# Patient Record
Sex: Male | Born: 1960 | Race: White | Hispanic: No | Marital: Single | Smoking: Never smoker
Health system: Southern US, Community
[De-identification: ages and names within clinical notes are randomized; demographics above are authoritative.]

## PROBLEM LIST (undated history)

## (undated) DIAGNOSIS — I10 Essential (primary) hypertension: Secondary | ICD-10-CM

## (undated) DIAGNOSIS — F1010 Alcohol abuse, uncomplicated: Secondary | ICD-10-CM

## (undated) DIAGNOSIS — F419 Anxiety disorder, unspecified: Secondary | ICD-10-CM

## (undated) DIAGNOSIS — J45909 Unspecified asthma, uncomplicated: Secondary | ICD-10-CM

## (undated) DIAGNOSIS — F319 Bipolar disorder, unspecified: Secondary | ICD-10-CM

---

## 2004-02-07 ENCOUNTER — Inpatient Hospital Stay (HOSPITAL_COMMUNITY): Admission: EM | Admit: 2004-02-07 | Discharge: 2004-02-09 | Payer: Self-pay | Admitting: Emergency Medicine

## 2006-07-14 ENCOUNTER — Emergency Department (HOSPITAL_COMMUNITY): Admission: EM | Admit: 2006-07-14 | Discharge: 2006-07-14 | Payer: Self-pay | Admitting: Emergency Medicine

## 2006-08-25 ENCOUNTER — Emergency Department (HOSPITAL_COMMUNITY): Admission: EM | Admit: 2006-08-25 | Discharge: 2006-08-26 | Payer: Self-pay | Admitting: Emergency Medicine

## 2007-03-30 ENCOUNTER — Emergency Department (HOSPITAL_COMMUNITY): Admission: EM | Admit: 2007-03-30 | Discharge: 2007-03-30 | Payer: Self-pay | Admitting: Emergency Medicine

## 2008-06-23 ENCOUNTER — Inpatient Hospital Stay (HOSPITAL_COMMUNITY): Admission: EM | Admit: 2008-06-23 | Discharge: 2008-06-27 | Payer: Self-pay | Admitting: Emergency Medicine

## 2008-06-23 ENCOUNTER — Ambulatory Visit: Payer: Self-pay | Admitting: Internal Medicine

## 2008-06-24 ENCOUNTER — Encounter (INDEPENDENT_AMBULATORY_CARE_PROVIDER_SITE_OTHER): Payer: Self-pay | Admitting: General Surgery

## 2008-06-25 ENCOUNTER — Encounter: Payer: Self-pay | Admitting: Cardiology

## 2008-07-20 ENCOUNTER — Emergency Department (HOSPITAL_COMMUNITY): Admission: EM | Admit: 2008-07-20 | Discharge: 2008-07-21 | Payer: Self-pay | Admitting: Emergency Medicine

## 2008-09-01 ENCOUNTER — Emergency Department (HOSPITAL_COMMUNITY): Admission: EM | Admit: 2008-09-01 | Discharge: 2008-09-01 | Payer: Self-pay | Admitting: Emergency Medicine

## 2010-10-02 LAB — DIFFERENTIAL
Basophils Absolute: 0 10*3/uL (ref 0.0–0.1)
Eosinophils Absolute: 0.1 10*3/uL (ref 0.0–0.7)
Neutro Abs: 5.7 10*3/uL (ref 1.7–7.7)
Neutrophils Relative %: 75 % (ref 43–77)

## 2010-10-02 LAB — POCT I-STAT, CHEM 8
BUN: 14 mg/dL (ref 6–23)
Calcium, Ion: 1.17 mmol/L (ref 1.12–1.32)
Chloride: 109 mEq/L (ref 96–112)
Creatinine, Ser: 0.9 mg/dL (ref 0.4–1.5)
Glucose, Bld: 101 mg/dL — ABNORMAL HIGH (ref 70–99)
Sodium: 141 mEq/L (ref 135–145)

## 2010-10-02 LAB — PROTIME-INR: Prothrombin Time: 12.5 seconds (ref 11.6–15.2)

## 2010-10-02 LAB — POCT CARDIAC MARKERS
CKMB, poc: <1 ng/mL — ABNORMAL LOW (ref 1.0–8.0)
Troponin i, poc: <0.05 ng/mL (ref 0.00–0.09)
Troponin i, poc: <0.05 ng/mL (ref 0.00–0.09)

## 2010-10-02 LAB — CBC
MCV: 97.8 fL (ref 78.0–100.0)
Platelets: 232 10*3/uL (ref 150–400)

## 2010-10-06 LAB — POCT I-STAT, CHEM 8
BUN: 17 mg/dL (ref 6–23)
Calcium, Ion: 1.08 mmol/L — ABNORMAL LOW (ref 1.12–1.32)
Creatinine, Ser: 1 mg/dL (ref 0.4–1.5)
Glucose, Bld: 110 mg/dL — ABNORMAL HIGH (ref 70–99)
TCO2: 22 mmol/L (ref 0–100)

## 2010-10-06 LAB — CBC
HCT: 44.7 % (ref 39.0–52.0)
HCT: 45.9 % (ref 39.0–52.0)
Hemoglobin: 15.1 g/dL (ref 13.0–17.0)
Hemoglobin: 15.7 g/dL (ref 13.0–17.0)
MCHC: 33.9 g/dL (ref 30.0–36.0)
MCHC: 34.3 g/dL (ref 30.0–36.0)
Platelets: 238 10*3/uL (ref 150–400)
RBC: 4.57 MIL/uL (ref 4.22–5.81)
RDW: 13 % (ref 11.5–15.5)
RDW: 12.9 % (ref 11.5–15.5)

## 2010-10-06 LAB — BASIC METABOLIC PANEL
BUN: 10 mg/dL (ref 6–23)
BUN: 11 mg/dL (ref 6–23)
Calcium: 8.2 mg/dL — ABNORMAL LOW (ref 8.4–10.5)
Calcium: 8.4 mg/dL (ref 8.4–10.5)
Calcium: 8.6 mg/dL (ref 8.4–10.5)
Chloride: 102 mEq/L (ref 96–112)
Chloride: 103 mEq/L (ref 96–112)
Creatinine, Ser: 0.79 mg/dL (ref 0.4–1.5)
Creatinine, Ser: 1.01 mg/dL (ref 0.4–1.5)
Creatinine, Ser: 1.02 mg/dL (ref 0.4–1.5)
GFR calc Af Amer: >60 mL/min (ref >60)
GFR calc non Af Amer: >60 mL/min (ref >60)
GFR calc non Af Amer: >60 mL/min (ref >60)
GFR calc non Af Amer: >60 mL/min (ref >60)
Glucose, Bld: 116 mg/dL — ABNORMAL HIGH (ref 70–99)
Potassium: 3.4 mEq/L — ABNORMAL LOW (ref 3.5–5.1)

## 2010-10-06 LAB — APTT: aPTT: 26 seconds (ref 24–37)

## 2010-10-06 LAB — URINALYSIS, ROUTINE W REFLEX MICROSCOPIC
Bilirubin Urine: NEGATIVE
Glucose, UA: NEGATIVE mg/dL
Nitrite: NEGATIVE
Specific Gravity, Urine: 1.002 — ABNORMAL LOW (ref 1.005–1.030)
pH: 6.5 (ref 5.0–8.0)

## 2010-10-06 LAB — HEPATIC FUNCTION PANEL
Albumin: 3.4 g/dL — ABNORMAL LOW (ref 3.5–5.2)
Alkaline Phosphatase: 78 U/L (ref 39–117)
Indirect Bilirubin: 0.8 mg/dL (ref 0.3–0.9)
Total Protein: 6.4 g/dL (ref 6.0–8.3)

## 2010-10-06 LAB — DIFFERENTIAL
Basophils Absolute: 0 10*3/uL (ref 0.0–0.1)
Basophils Absolute: 0 10*3/uL (ref 0.0–0.1)
Basophils Relative: 0 % (ref 0–1)
Eosinophils Relative: 0 % (ref 0–5)
Eosinophils Relative: 1 % (ref 0–5)
Lymphocytes Relative: 20 % (ref 12–46)
Monocytes Absolute: 0.3 10*3/uL (ref 0.1–1.0)
Monocytes Absolute: 0.3 10*3/uL (ref 0.1–1.0)
Monocytes Relative: 4 % (ref 3–12)
Neutro Abs: 5 10*3/uL (ref 1.7–7.7)

## 2010-10-06 LAB — CARDIAC PANEL(CRET KIN+CKTOT+MB+TROPI)
CK, MB: 1.1 ng/mL (ref 0.3–4.0)
Relative Index: 0.7 (ref 0.0–2.5)
Troponin I: <0.01 ng/mL (ref 0.00–0.06)

## 2010-10-06 LAB — TSH: TSH: 1.967 u[IU]/mL (ref 0.350–4.500)

## 2010-10-06 LAB — LIPID PANEL
Cholesterol: 210 mg/dL — ABNORMAL HIGH (ref 0–200)
HDL: 55 mg/dL (ref >39)
LDL Cholesterol: 119 mg/dL — ABNORMAL HIGH (ref 0–99)

## 2010-10-06 LAB — RAPID URINE DRUG SCREEN, HOSP PERFORMED
Benzodiazepines: POSITIVE — AB
Cocaine: NOT DETECTED
Opiates: NOT DETECTED
Tetrahydrocannabinol: NOT DETECTED

## 2010-10-06 LAB — MAGNESIUM: Magnesium: 2.1 mg/dL (ref 1.5–2.5)

## 2010-11-04 NOTE — H&P (Signed)
NAME:  Eddie Torres, Eddie Torres NO.:  [id number]   MEDICAL RECORD NO.:  [id number]          PATIENT TYPE:  EMS   LOCATION:  MAJO                         FACILITY:  [facility]   PHYSICIAN:  Adolph Pollack, M.D.DATE OF BIRTH:  Sep 04, 1960   DATE OF ADMISSION:  06/23/2008  DATE OF DISCHARGE:                              HISTORY & PHYSICAL   This is a trauma admission.   HISTORY:  This is a 50 year old male who came to the emergency  department after a witnessed fall from level ground.  Apparently, he was  heavily intoxicated.  Witnesses state that they thought the patient may  have had a loss of consciousness, but that is unclear.  He is  transferred to [facility] Emergency Department where he was evaluated.  Part of his evaluation include head CT, which demonstrated a  subarachnoid hemorrhage in the frontal region.  No mass effect or  midline shift.  Cervical spine x-ray was performed.  No C-spine fracture  was noted.  He initially was combative, but has calmed down.  He is  quite intoxicated, but denies any pain or trauma to any other part of  his body.   PAST MEDICAL HISTORY:  1. History of syncope.  2. Alcohol abuse.  3. L3-L4 herniation.  4. Depression and anxiety.  5. Gastroesophageal reflux disease.  6. Asthma.   PREVIOUS OPERATIONS:  Denies.   MEDICATIONS:  1. Goody Powders p.r.n.  2. Albuterol inhaler as needed.   SOCIAL HISTORY:  He is a former smoker.  Drinks alcohol daily and he was  drinking heavily today.  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   FAMILY HISTORY:  Coronary artery disease.   REVIEW OF SYSTEMS:  Cannot get a reliable review of systems in a current  intoxicated state.   PHYSICAL EXAMINATION:  (Made difficult by mental status and his  intoxicated state).  GENERAL:  A well-developed and well-nourished male sitting upright in  the stretcher, C-collar on, noncombative.  HEENT:  Normocephalic and atraumatic.  PERRLA.  EOMI.  Ears normal.  NECK:  C-collar on.  Denies C-spine tenderness.  Trachea midline.  CHEST:  No contusions or abrasions.  Breath sounds equal and clear.  CARDIOVASCULAR:  Regular rate regular.  No murmur.  No JVD.  ABDOMEN:  Soft and nontender.  No abrasions or contusions.  PELVIS:  No tenderness or deformity.  MUSCULOSKELETAL:  No palpable bony deformities or tenderness.  BACK:  No spinal tenderness.  NEUROLOGIC:  He has slurred speech, unintelligible at times, but he does  follow commands.  His Glasgow coma scale is 14-15.   LABORATORY DATA:  Blood alcohol 376.  INR 1.0.  Hemoglobin 15.1,  platelet count 232,000.  White count normal.  Electrolytes demonstrate  potassium 3.4, glucose of 107, otherwise within normal limits.   X-Rays:  His CT of the head demonstrates subarachnoid hemorrhage as per  HPI and C-spine demonstrates no fracture or dislocation present.  A  portal chest x-ray was ordered.  Final report is pending at this time,  but my preliminary review shows no evidence of pneumothorax.   IMPRESSION:  Fall from level ground in acute intoxication with  subarachnoid hemorrhage and no significant neurologic deficits at this  time.  He has been seen already by Dr. Lovell Sheehan in Neurosurgery.   PLAN:  We will admit to the ICU for close observation.  Keep him n.p.o.  Give him supplemental folate, thiamine, and magnesium.  We will repeat  CT scan early tomorrow morning.      Adolph Pollack, M.D.  Electronically Signed     TJR/MEDQ  D:  06/23/2008  T:  06/24/2008  Job:  [id number]

## 2010-11-04 NOTE — Discharge Summary (Signed)
NAMEJARMON, Eddie NO.:  [id number]   MEDICAL RECORD NO.:  [id number]          PATIENT TYPE:  INP   LOCATION:  3110                         FACILITY:  [facility]   PHYSICIAN:  Cherylynn Ridges, M.D.    DATE OF BIRTH:  1961/06/02   DATE OF ADMISSION:  06/23/2008  DATE OF DISCHARGE:  06/27/2008                               DISCHARGE SUMMARY   DISCHARGE DIAGNOSES:  1. Fall.  2. Traumatic brain injury with subarachnoid hemorrhage.  3. Ventricular tachycardia.  4. Alcohol abuse.  5. Asthma.  6. Gastroesophageal reflux disease.  7. Depression and anxiety.   CONSULTANTS:  Dr. Lovell Sheehan for Neurosurgery and Dr. Graciela Husbands for  Interventional Cardiology.   PROCEDURES:  None.   HISTORY OF PRESENT ILLNESS:  This is a 50 year old white male who came  to the emergency department after a witnessed fall from level ground.  He came in as a non-trauma code.  He was significantly intoxicated and  initially combative.  Workup demonstrated subarachnoid hemorrhage and he  was admitted, transferred to the Intensive Care Unit for further  observation, and Neurosurgery was consulted.   HOSPITAL COURSE:  On hospital day #2, the patient developed a  significant run of what appeared to be V tach and he spontaneously  converted.  Cardiology was called and initially put him on beta-blocker  and Coreg.  He was unable to tolerate the beta-blocker because of  hypotension and so was just kept on Coreg.  He had no further events  passed that first day while here in the hospital.  His brain injury did  not progress radiologically and the patient continued to improve  clinically until physical therapy thought he was safe to go home.  He  was discharged today in good condition.   DISCHARGE MEDICATIONS:  1. Coreg 3.125 mg, take 1 p.o. b.i.d. #60 with no refill.  2. Percocet 5/325, take 1-2 p.o. q.4 h p.r.n. pain, #60 with no      refill.  3. He may resume taking his Xanax 0.5 mg at bedtime p.r.n.  insomnia.   FOLLOWUP:  The patient will need to follow up with Dr. Lovell Sheehan and Dr.  Graciela Husbands in their offices.  Follow up with the Trauma Service will be on an  as-needed basis.      Earney Hamburg, P.A.      Cherylynn Ridges, M.D.  Electronically Signed    MJ/MEDQ  D:  06/27/2008  T:  06/27/2008  Job:  [id number]   cc:   Duke Salvia, MD, [facility]  Cristi Loron, M.D.

## 2010-11-04 NOTE — Consult Note (Signed)
NAME:  Eddie Torres, Eddie Torres NO.:  [id number]   MEDICAL RECORD NO.:  [id number]          PATIENT TYPE:  INP   LOCATION:  3110                         FACILITY:  [facility]   PHYSICIAN:  Cristi Loron, M.D.DATE OF BIRTH:  [birth year]   DATE OF CONSULTATION:  DATE OF DISCHARGE:                                 CONSULTATION   CHIEF COMPLAINT:  Fall.   HISTORY OF PRESENT ILLNESS:  The patient is a 50 year old intoxicated  white male by report was that his brother's house, intoxicated, he fell  ending his head on some concrete.  EMS was called.  He was brought to  the [facility] Emergency Department.  Prior to my arrival, apparently  the patient was quite combative and has been sedated.  The patient  denies neck pain and back pain.   Presently, the patient is arousable and somewhat somnolent.  He is  unable to provide a medical history.   The patient's past medical history, past surgical history, family  history, and social history are all obtained via medical records and by  report he has history of asthma.  He is a heavy drinker and he denies  tobacco use.   REVIEW OF SYSTEMS:  Unobtainable.   PHYSICAL EXAMINATION:  GENERAL:  An obviously intoxicated, 50 year old  white male who smells of alcohol, who is restrained and wearing a  cervical collar.  HEENT:  Normocephalic.  The patient's pupils equal, round, and react to  light.  Extraocular muscles are intact.  No obvious cranial nerve  defects.  NECK:  Supple without masses, deformities, or tracheal deviation.  He is  wearing a cervical collar.  THORAX:  Symmetric.  ABDOMEN:  Obese, soft.  EXTREMITIES:  No obvious deformities.  BACK:  No point tenderness or deformities.  NEUROLOGIC:  The patient's Glasgow coma scale is 13 (E4, M6, V3).  He is  alert, but not oriented.  The patient's level of cooperation is limited.  He appears to have grossly normal motor strength in all 4 extremities.  Cranial nerve exam is  limited, but appears grossly normal as above.  His  pupils were equal and reactive.  Deep tendon reflexes are 1/4 in  bilateral biceps, triceps, quadriceps, and gastrocnemius.  There is no  ankle clonus.   IMAGING STUDIES:  I have reviewed the patient's cranial CT scan  performed without contrast at [facility] on June 23, 2008.  It demonstrates the patient has small interhemispheric subdural  hematoma/subarachnoid hemorrhage.  There is no blood in the ambient  cistern.  Also, we reviewed the patient's cervical CT scan.  It  demonstrates he had some disk degeneration and spondylosis C6-7, but no  fracture subluxation, abnormal soft tissue swelling.   ASSESSMENT AND PLAN:  1. Intoxication.  The patient is going to be admitted by one of the      medical teams who likely need delirium tremens prophylaxis, given      his history of heavy alcohol abuse.  The medical team is going to      handle this.  2. Closed head injury.  Subdural hematoma/subarachnoid hemorrhage.  This is a small bleed.  The patient needs to be observed and have      his CAT scan repeated in approximately 18-24 hours.  He is very      unlikely to need any intervention unless there was significantly      more bleeding.      Cristi Loron, M.D.  Electronically Signed     JDJ/MEDQ  D:  06/23/2008  T:  06/24/2008  Job:  [id number]

## 2010-11-07 NOTE — Discharge Summary (Signed)
NAME:  Eddie Torres, Eddie Torres                       ACCOUNT NO.:  [id number]   MEDICAL RECORD NO.:  [id number]                   PATIENT TYPE:  INP   LOCATION:  3743                                 FACILITY:  [facility]   PHYSICIAN:  C. Ulyess Mort, M.D.             DATE OF BIRTH:  [birth year]   DATE OF ADMISSION:  02/07/2004  DATE OF DISCHARGE:  02/09/2004                                 DISCHARGE SUMMARY   DISCHARGE DIAGNOSES:  1. Syncope.  2. Asthma.  3. Gastroesophageal reflux disease.  4. Panic attacks and depression with generalized anxiety.  5. Isolated syncope in 1993, following overnight party.  6. Alcohol use/abuse.  7. L3-L4 herniation.  8. Right knuckle displacement in 2005.   DISCHARGE MEDICATIONS:  1. Zoloft 25 mg p.o. q.d. for depression and anxiety (starting dose).  2. Albuterol MDI one to two puffs as needed for wheezing.  3. Prilosec OTC as directed for GERD.  4. Percocet 5/325 mg one to two tablets p.o. every 4-6 hours as needed for     pain.  5. Ambien 10 mg p.o. q.h.s. for sleep, 15 tablets only.  6. Tums one tablet p.o. two to three times daily.  7. Multivitamin OTC p.o. q.d.   FOLLOW UP:  Eddie Torres will follow up here at the clinic at [facility].  It  will be arranged for him at that time to have a primary care Davian Hanshaw  provided by [facility].  At this time, it would be good to check his blood  pressure.  It was in the 150s/90s in the hospital.  On three separate  occasions, he had hypertension.  It will also be necessary to check his  potassium this visit.  He will need to be evaluated for an increase in his  Zoloft from 25-50 mg.  This is the starting dose for his anxiety/depression.  It is known to be necessary to increase the full limits of the SSRI in order  to treat the anxiety.  He is aware of this transition and the possibility of  anxiety increasing during the initial starting dose of the sertraline.   PROCEDURES:  1. Upon arriving, Eddie Torres  had a chest x-ray showing linear hypodensity     along the posterior aspect of the right 11th rib representing an old     fracture.  Also, density on prior radiographs is believed to be due to     superimposition of pulmonary vascular structures and not an infiltrate.  2. Head computed tomography without contrast on February 07, 2004.  Impression     was no acute abnormality.  3. The maxillofacial computed tomography without contrast showed no     fractures or paranasal sinus air fluid levels.  The mid portion of the     nasal septum is deviated, but there was no fracture.  4. Computed tomography of the chest with contrast following the chest  radiograph with an impression of no opacities seen, linear hypodensity     along the posterior aspect of the right 11th rib signifying the old     fracture.  No pathology was found.  5. Multiple electrocardiograms while here all showing sinus rhythm.   CONSULTATIONS:  None.  We discussed consulting cardiology and neurology  during this time, but felt that it would not be necessary and we will  continue to follow his potential syncope events in the outpatient setting.   HISTORY OF PRESENT ILLNESS:  Eddie Torres is a 50 year old with past medical  history significant for childhood epilepsy, anxiety and panic attacks, GERD  and asthma who was brought into the ED by ambulance from a construction  site.  He describes a recent history of not being able to sleep for more  than 1-2 hours, waking up with tightness in his chest diffusely and  increasing fatigue over the last week.  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  He fell onto his face causing his  nose to bleed.  He claims he was unconscious for 15-20 minutes waiting on  the ambulance.  However, witnesses did claim  that he was in and out of  consciousness with his nose bleeding.  He denies incontinence of bowel or  bladder.  His co-worker witnesses denied tonic-clonic activity or slurred  speech.  He remembers lying on the ground hearing people around him.  He has  had one other similar episode over 10 years ago that he was told was due to  stress.  He denies dehydration or orthostasis on a regular basis, nausea,  vomiting, diarrhea, constipation, occasional chest tightness in the morning  upon waking, no numbness or tingling in extremities.  He admits to  occasional episodes of blurry vision while reading.  He admits to reflux and  headaches as well as body aches and back pain from work.  While in the ED,  he reported a headache over his forehead and bridge of nose where he had  fallen.   LABORATORY DATA AND X-RAY FINDINGS:  On admission, he had a cardiac enzyme  with CK-MB of 1.8, myoglobin over 500, troponin I less than 0.05.  He had a  white blood cell count of 14.5, hemoglobin 14.6, hematocrit 41.3, platelets  193.  ANC of 12.8, MCV 95.7.  PT 13.6, INR 1.1, PTT 23.   PHYSICAL EXAMINATION:  VITAL SIGNS:  Pulse 90, blood pressure 144/87,  temperature 97.8, respirations 14, 98% on room air.  GENERAL:  No apparent distress, lying supine in the emergency department  hall with crusted blood on his face covered in saw dust.  HEENT:  Conjunctivitis with small scleral hemorrhage, otherwise clear.  Cranial nerve 3, 4 and 6 intact.  Pupils equal round and reactive to light,  no nystagmus.  Nose was swollen with crust blood in filtrum, however, stable  and symmetric.  NECK:  No lymphadenopathy, no JVD appreciated with dark tan brown from sun  exposure.  RESPIRATORY:  Clear to auscultation bilaterally with no wheezing.  Good  breath sounds.  Some congestion.  CARDIAC:  Regular rate and rhythm with no appreciable murmurs, no pitting  edema, 2+ bilaterally at femoral and dorsalis pedis pulses. ABDOMEN:   Right lower quadrant tenderness with deep palpation.  No  organomegaly or caput medusa, no spider angiomas.  EXTREMITIES:  No edema.  GENITALIA:  Right groin tenderness with deep palpation.  No noticeable  herniations.  SKIN:  Sun changes, brown tan skin with freckles.  Positive palmar erythema.  MUSCULOSKELETAL:  5/5 strength in upper and lower extremities.  Normal  sensation bilaterally in upper and lower.  NEUROLOGIC:  Alert and oriented to time, place and situation.  Normocephalic  with external injury to face.  No peripheral neuropathy.  PSYCHIATRIC:  Mildly anxious, but pleasant affect.   HOSPITAL COURSE:  Eddie Torres is a 51 year old, Caucasian male with past  medical history significant for childhood epilepsy, asthma, generalized  anxiety who presented to the ED by ambulance following a syncopal event on  construction job site.   Problem 1.  SYNCOPE:  Syncope could be secondary to multiple causes and  factors.  A generalized review includes dehydration which is a possibility  for him given job and site of event.  Orthostatic hypotension is a  possibility of cardiac origin and does not have atrial stenosis, history of  HCM and unlikely to have a PE, no arrhythmias, negative EKG with sinus  rhythm, negative cardiac enzymes, specifically troponins.  Noncardiac  possibilities include drugs in which we did a urine drug screen which was  negative.  We did not however, check alcohol at this time.  He does have a  history of seizure which could present the possibility of a syncopal like  episode.  It did not seem, however, by history to be a seizure.  TIA was a  possibility, however, he had negative bruits on exam and a normal lipid  panel.  He has a significant history for generalized anxiety and panic  attacks and admits to recent stressors in his life.  He also admits to his  brother dying recently in the last several months of sudden death at 63 with  a heart condition causing him  more anxiety about his heart.  We admitted him  to the telemetry floor to monitor him.  His chest x-ray, CT and EKG were  normal.  His cardiac enzyme and troponin was normal, however, his CK and CK-  MB continued to increase from 600s to 2700s and then back down to 2000  before discharge.  We attribute this to the possibility of inflammatory mild  rhabdomyolysis.  We continued to hydrate him.  His enzymes did decrease  before discharge.  He also had a low potassium and we were able to give him  more potassium during this hospital stay.  We checked orthostatic blood  pressure each day and he appeared not to be orthostatic.   Problem 2.  HEADACHE:  Likely secondary to fall on his nose after syncope  event.  We gave him Percocet for pain p.r.n.  He had head CT and  maxillofacial CT to rule out fracture and bleed.  Both were negative.  We  did frequent neurologic exams while in the hospital to watch for changes and  this remained benign.  Problem 3.  ANXIETY AND PANIC:  He has a long history of anxiety and has  required medications in the past.  He admits to needing something for his  panic attacks and admits to this being a problem for him in his life.  We  started a SSRI while he was admitted.  We plan on continuing to increase the  dose of the SSRI to levels that seem to help anxiety.  This  will require  several months to increase and he understands the possibility of an increase  in anxiety initially and possibly side effects of GI symptoms.   Problem 4.  ALCOHOL HISTORY:  We checked a folate which was normal and B12.  His MCV is normal also and he did not appear anemic.  We gave him thiamine  100 mg when he first entered the ED to cover any alcohol related  complications and malnutrition.   Problem 5.  RHABDOMYOLYSIS:  Eddie Torres had a CK ranging from 617, 2287,  2709 and 2500.  His CK-MB went from 4.8 to 13.5 to 25.5 and back down to  23.7.  His troponin I remained low at 0.02  during the entire admission.  It  is unsure why is CK and CK-MB would be increased.  It is a possibility to  get rhabdomyolysis from hypovolemia, also trauma.  He did have a pretty  significant fall to the face.  We are unsure of how much damage would have  happened to his body or musculoskeletal.  Alcoholism, drugs, toxin, seizure,  extreme exertion, environmental heat illness are all known causes of  rhabdomyolysis.  He did have signs and symptoms upon admission of bilateral  muscle tenderness in the calves and increased muscle tone as well as a low  calcium.  He did not have a rise in his creatinine.  He did not experience a  crush injury or have pressure necrosis.  His levels of CK were significant,  but not typical of a severe rhabdomyolysis.  Before discharge, his CK and CK-  MB did trend down.  He also denied having any muscle tenderness upon  discharge.   Problem 6.  HISTORY OF EPILEPSY:  He has had no seizures for over 30 years  and has been untreated.  Likely not seizure source for this syncope, but  could consider outpatient sleep study following discharge.   Problem 7.  FACIAL NASAL INJURY:  He did not have a fracture per CT, not  actively bleeding during this admission.  We did not need to pack or cool  his nares.  He denied any pain upon discharge.   DISPOSITION:  Eddie Torres was discharged without complaint.  He did not have  any syncopal episode while in the hospital.  It is likely that he was  dehydrated.  He did admit to not having any food or water prior to his  event.  We will continue to follow him in outpatient clinic at [facility] to  evaluate further syncopal events, depression and anxiety.  We will  reevaluate his CK, CK-MB during outpatient visit.   DISCHARGE LABORATORY DATA AND X-RAY FINDINGS:  His urine drug screen was  negative for everything, except opiates were positive.  His cholesterol was  167, triglyceride 192, HDL 41, cholesterol HDL ratio 4.1, LDL  88.  His cardiac markers with relative index 0.9.  His troponin was 0.02 x3.  His CK-  MB was 1.8.  Myoglobin over 500.  His Vitamin B12 was 208.  Routine  hematologies with white blood cell count 14.5, red blood cell count 4.3,  hemoglobin 14.6, hematocrit 41.3, MCV 95.7, RDW 12.5, platelets 193,  neutrophils 89.  Protime 13.6, INR 1.1, PTT 23.  Routine chemistries with  sodium 140, potassium 3.8, chloride 110, CO2 25, glucose 93, BUN 7-14,  creatinine 0.9.  His bilirubin was 0.9, Alk phos 70, SGOT 30, SGPT 37, total  protein 6.2, albumin 3.6, calcium 8.3 to  8.6, magnesium 1.8.  Cardiac  markers on discharge with his creatinine kinase went down to 1609, CK-MB  13.7.      Zetta Bills, MD                             Gary Fleet, M.D.    JP/MEDQ  D:  02/11/2004  T:  02/12/2004  Job:  [id number]

## 2011-04-02 LAB — CBC
MCV: 95.3
RBC: 4.6
WBC: 6.8

## 2011-04-02 LAB — URINALYSIS, ROUTINE W REFLEX MICROSCOPIC
Hgb urine dipstick: NEGATIVE
Specific Gravity, Urine: 1.004 — ABNORMAL LOW
Urobilinogen, UA: 0.2

## 2011-04-02 LAB — DIFFERENTIAL
Eosinophils Absolute: 0
Lymphs Abs: 1.4
Monocytes Relative: 7
Neutro Abs: 4.8
Neutrophils Relative %: 71

## 2011-04-02 LAB — BASIC METABOLIC PANEL
BUN: 8
CO2: 24
Calcium: 9.4
Creatinine, Ser: 0.9
Glucose, Bld: 103 — ABNORMAL HIGH
Sodium: 140

## 2011-04-02 LAB — POCT CARDIAC MARKERS
CKMB, poc: <1 — ABNORMAL LOW
CKMB, poc: <1 — ABNORMAL LOW

## 2013-05-25 ENCOUNTER — Emergency Department (HOSPITAL_COMMUNITY): Payer: BC Managed Care – PPO

## 2013-05-25 ENCOUNTER — Encounter (HOSPITAL_COMMUNITY): Payer: Self-pay | Admitting: Emergency Medicine

## 2013-05-25 ENCOUNTER — Emergency Department (HOSPITAL_COMMUNITY)
Admission: EM | Admit: 2013-05-25 | Discharge: 2013-05-26 | Disposition: A | Discharge status: Home / Self Care | Payer: BC Managed Care – PPO | Attending: Emergency Medicine | Admitting: Emergency Medicine

## 2013-05-25 ENCOUNTER — Other Ambulatory Visit: Payer: Self-pay

## 2013-05-25 DIAGNOSIS — F1010 Alcohol abuse, uncomplicated: Secondary | ICD-10-CM

## 2013-05-25 DIAGNOSIS — R000 Tachycardia, unspecified: Secondary | ICD-10-CM | POA: Insufficient documentation

## 2013-05-25 DIAGNOSIS — W19XXXA Unspecified fall, initial encounter: Secondary | ICD-10-CM

## 2013-05-25 DIAGNOSIS — F10929 Alcohol use, unspecified with intoxication, unspecified: Secondary | ICD-10-CM

## 2013-05-25 DIAGNOSIS — I10 Essential (primary) hypertension: Secondary | ICD-10-CM

## 2013-05-25 DIAGNOSIS — R55 Syncope and collapse: Secondary | ICD-10-CM | POA: Insufficient documentation

## 2013-05-25 HISTORY — DX: Essential (primary) hypertension: I10

## 2013-05-25 LAB — CBC
MCH: 32.8 pg (ref 26.0–34.0)
MCV: 91.9 fL (ref 78.0–100.0)
Platelets: 233 10*3/uL (ref 150–400)
RBC: 5.16 MIL/uL (ref 4.22–5.81)

## 2013-05-25 LAB — COMPREHENSIVE METABOLIC PANEL
BUN: 9 mg/dL (ref 6–23)
CO2: 24 mEq/L (ref 19–32)
Calcium: 9 mg/dL (ref 8.4–10.5)
Creatinine, Ser: 0.83 mg/dL (ref 0.50–1.35)
GFR calc Af Amer: >90 mL/min (ref >90)
GFR calc non Af Amer: >90 mL/min (ref >90)
Glucose, Bld: 114 mg/dL — ABNORMAL HIGH (ref 70–99)

## 2013-05-25 LAB — RAPID URINE DRUG SCREEN, HOSP PERFORMED: Opiates: NOT DETECTED

## 2013-05-25 LAB — ETHANOL: Alcohol, Ethyl (B): 350 mg/dL — ABNORMAL HIGH (ref 0–11)

## 2013-05-25 LAB — SALICYLATE LEVEL: Salicylate Lvl: <2 mg/dL — ABNORMAL LOW (ref 2.8–20.0)

## 2013-05-25 MED ORDER — LORAZEPAM 1 MG PO TABS
0.0000 mg | ORAL_TABLET | Freq: Four times a day (QID) | ORAL | Status: DC
  Administered 2013-05-25: 2 mg via ORAL
  Administered 2013-05-26: 1 mg via ORAL
  Administered 2013-05-26: 2 mg via ORAL
  Filled 2013-05-25: qty 2
  Filled 2013-05-25: qty 1
  Filled 2013-05-25: qty 2

## 2013-05-25 MED ORDER — ACETAMINOPHEN 325 MG PO TABS: 650.0000 mg | ORAL_TABLET | ORAL | Status: DC | PRN

## 2013-05-25 MED ORDER — SODIUM CHLORIDE 0.9 % IV BOLUS (SEPSIS)
500.0000 mL | Freq: Once | INTRAVENOUS | Status: AC
  Administered 2013-05-25: 500 mL via INTRAVENOUS

## 2013-05-25 MED ORDER — IBUPROFEN 200 MG PO TABS: 600.0000 mg | ORAL_TABLET | Freq: Three times a day (TID) | ORAL | Status: DC | PRN

## 2013-05-25 MED ORDER — ONDANSETRON HCL 4 MG PO TABS: 4.0000 mg | ORAL_TABLET | Freq: Three times a day (TID) | ORAL | Status: DC | PRN

## 2013-05-25 MED ORDER — LORAZEPAM 1 MG PO TABS: 0.0000 mg | ORAL_TABLET | Freq: Two times a day (BID) | ORAL | Status: DC

## 2013-05-25 NOTE — ED Notes (Signed)
Pt has 3 personal belongings bags, behind triage desk.

## 2013-05-25 NOTE — ED Notes (Signed)
Pt arrived to unit requesting detox form alcohol. Pt denies SI/HI or AV hallucinations. Pt states he has been drinking most of his life, but feels that he overdid it today. Pt states he is stressed over his two daughters who are both in college and says they are going through "some situations" but would not elaborate. Pt pleasant and cooperative with assessment. No distress noted.

## 2013-05-25 NOTE — ED Provider Notes (Signed)
CSN: [id number]     Arrival date & time 05/25/13  1623 History   First MD Initiated Contact with Patient 05/25/13 1729     Chief Complaint  Patient presents with  . Fall  . Medical Clearance   (Consider location/radiation/quality/duration/timing/severity/associated sxs/prior Treatment) Patient is a 52 y.o. male presenting with fall. The history is provided by the patient.  Fall Pertinent negatives include no chest pain, no abdominal pain, no headaches and no shortness of breath.   patient presents after a fall. He may have passed out. He states his been drinking heavily. He states he hit the back of his head. He states that he needs help with his drinking. He states he will drink to 40s a day. He states he may drink 3 or 4 from today. He has urinated on himself and smells of stool. No chest pain. No abdominal pain. No headache.he denies suicidal homicidal thoughts. He denies depression. He denies chest pain.   Past Medical History  Diagnosis Date  . Hypertension    History reviewed. No pertinent past surgical history. No family history on file. History  Substance Use Topics  . Smoking status: Never Smoker   . Smokeless tobacco: Never Used  . Alcohol Use: Yes    Review of Systems  Constitutional: Negative for activity change and appetite change.  Eyes: Negative for pain.  Respiratory: Negative for chest tightness and shortness of breath.   Cardiovascular: Negative for chest pain and leg swelling.  Gastrointestinal: Negative for nausea, vomiting, abdominal pain and diarrhea.  Genitourinary: Negative for flank pain.  Musculoskeletal: Negative for back pain and neck stiffness.  Skin: Negative for rash.  Neurological: Positive for syncope. Negative for weakness, numbness and headaches.  Psychiatric/Behavioral: Negative for suicidal ideas and behavioral problems.    Allergies  Shellfish allergy  Home Medications   Current Outpatient Rx  Name  Route  Sig  Dispense  Refill  .  ibuprofen (ADVIL,MOTRIN) 600 MG tablet   Oral   Take 600 mg by mouth every 6 (six) hours as needed (pain).          BP 140/95  Pulse 120  Temp(Src) 98.3 F (36.8 C) (Oral)  Resp 21  SpO2 96% Physical Exam  Nursing note and vitals reviewed. Constitutional: He is oriented to person, place, and time. He appears well-developed and well-nourished.  HENT:  Head: Normocephalic and atraumatic.  Eyes: EOM are normal. Pupils are equal, round, and reactive to light.  Neck: Normal range of motion. Neck supple.  Cardiovascular: Regular rhythm and normal heart sounds.   No murmur heard. Mild tachycardia  Pulmonary/Chest: Effort normal and breath sounds normal.  Abdominal: Soft. Bowel sounds are normal. He exhibits no distension and no mass. There is no tenderness. There is no rebound and no guarding.  Musculoskeletal: Normal range of motion. He exhibits no edema.  Neurological: He is alert and oriented to person, place, and time. No cranial nerve deficit.  Patient smells of urine and stool. He has a wet area on the front of his pants.  Skin: Skin is warm and dry.  Psychiatric: He has a normal mood and affect.    ED Course  Procedures (including critical care time) Labs Review Labs Reviewed  CBC - Abnormal; Notable for the following:    WBC 11.5 (*)    All other components within normal limits  COMPREHENSIVE METABOLIC PANEL - Abnormal; Notable for the following:    Glucose, Bld 114 (*)    All other components within  normal limits  ETHANOL - Abnormal; Notable for the following:    Alcohol, Ethyl (B) 350 (*)    All other components within normal limits  SALICYLATE LEVEL - Abnormal; Notable for the following:    Salicylate Lvl <2.0 (*)    All other components within normal limits  ACETAMINOPHEN LEVEL  URINE RAPID DRUG SCREEN (HOSP PERFORMED)  TROPONIN I   Imaging Review Dg Chest 2 View  05/25/2013   CLINICAL DATA:  Syncope.  Fall.  Hypertension.  EXAM: CHEST  2 VIEW  COMPARISON:   09/01/2008  FINDINGS: Heart size is normal both lungs are clear. No evidence of pleural effusion. No mass or lymphadenopathy identified. Old left posterior rib fracture deformities incidentally noted.  IMPRESSION: No active cardiopulmonary disease.   Electronically Signed   By: Myles Rosenthal M.D.   On: 05/25/2013 18:03   Ct Head Wo Contrast  05/25/2013   CLINICAL DATA:  Fall, intoxicated  EXAM: CT HEAD WITHOUT CONTRAST  TECHNIQUE: Contiguous axial images were obtained from the base of the skull through the vertex without intravenous contrast.  COMPARISON:  Prior CT head and cervical spine 07/20/2008  FINDINGS: Negative for acute intracranial hemorrhage, acute infarction, mass, mass effect, hydrocephalus or midline shift. Gray-white differentiation is preserved throughout. Globes and orbits are symmetric and unremarkable. No focal soft tissue abnormality or scalp hematoma. No acute calvarial abnormality. Dilated perivascular space versus remote lacunar infarct in the inferior left basal ganglia similar compared to prior. Normal aeration of the mastoid air cells and visualized paranasal sinuses.  IMPRESSION: Negative head CT.   Electronically Signed   By: Malachy Moan M.D.   On: 05/25/2013 18:59    EKG Interpretation   None      Date: 05/25/2013  Rate: 104  Rhythm: sinus tachycardia  QRS Axis: normal  Intervals: normal  ST/T Wave abnormalities: normal  Conduction Disutrbances:none  Narrative Interpretation:   Old EKG Reviewed: none available    MDM  No diagnosis found. Patient with fall. Likely related to alcohol. Blood pressure is good, however there was some tachycardia that improved with IV fluids. EKG is reassuring. Lab work is overall reassuring but has a alcohol level of 350. Will be seen by TTS.    Juliet Rude. Rubin Payor, MD 05/25/13 2107

## 2013-05-25 NOTE — ED Notes (Signed)
Pt reports being in a fall earlier today. Pt reports "drinking quite a bit" before the fall. Pt reports lower back pain after the fall. EMS attempted to place a c-collar, however the patient removed it during transport. EMS reports a history of hypertension and pt states he has not taken his medications in months.

## 2013-05-25 NOTE — ED Notes (Signed)
Bed: [facility]3 Expected date:  Expected time:  Means of arrival:  Comments: EMS-ETOH 

## 2013-05-25 NOTE — Progress Notes (Signed)
Patient confirms his pcp is Dr. Mayford Knife on [address]  Patient reports he has not seen his physician in at least five to six months.  System updated.

## 2013-05-25 NOTE — ED Notes (Signed)
Pt unable to urinate at this time.  

## 2013-05-25 NOTE — Consult Note (Signed)
Pt not medically cleared yet.  Complaining of head ache and MD will monitor on acute side for a period of time and then move him back to psych ed when stable.  Dr. Rubin Payor was in agreement that TTS can wait to see pt when he is safely moved back to psych ed.  Also, BAL is 350  This may be between 2300 and 100 depending on clearance process.

## 2013-05-26 DIAGNOSIS — F1010 Alcohol abuse, uncomplicated: Secondary | ICD-10-CM

## 2013-05-26 MED ORDER — PANTOPRAZOLE SODIUM 40 MG PO TBEC
40.0000 mg | DELAYED_RELEASE_TABLET | Freq: Every day | ORAL | Status: DC
  Administered 2013-05-26: 40 mg via ORAL
  Filled 2013-05-26: qty 1

## 2013-05-26 MED ORDER — METOPROLOL TARTRATE 25 MG PO TABS
50.0000 mg | ORAL_TABLET | Freq: Two times a day (BID) | ORAL | Status: DC
  Administered 2013-05-26: 50 mg via ORAL
  Filled 2013-05-26: qty 2

## 2013-05-26 MED ORDER — METOPROLOL SUCCINATE ER 50 MG PO TB24: 50.0000 mg | ORAL_TABLET | Freq: Every day | ORAL | Status: DC

## 2013-05-26 MED ORDER — LORAZEPAM 1 MG PO TABS: 1.0000 mg | ORAL_TABLET | Freq: Four times a day (QID) | ORAL | Status: DC | PRN

## 2013-05-26 NOTE — Consult Note (Signed)
[facility] Face-to-Face Psychiatry Consult   Reason for Consult:  "I drank too much last night" Referring Physician:  EDP  Eddie Torres is an 52 y.o. male.  Assessment: AXIS I:  Alcohol Abuse AXIS II:  Deferred AXIS III:   Past Medical History  Diagnosis Date  . Hypertension    AXIS IV:  other psychosocial or environmental problems AXIS V:  51-60 moderate symptoms  Plan:  No evidence of imminent risk to self or others at present.   Patient does not meet criteria for psychiatric inpatient admission. Supportive therapy provided about ongoing stressors. Discussed crisis plan, support from social network, calling 911, coming to the Emergency Department, and calling Suicide Hotline.  Subjective:   Eddie Torres is a 52 y.o. male patient admitted for "drinking too much last night". Pt was interviewed with NP Josephine. Chart reviewed. He reports a history of high blood pressure as well. He reports drinking beer approximately every other day. He reports drinking 3x 40 oz beers yesterday, and so I "passed out". He has been drinking since 52 years old. No history of detox or rehab. No history of withdrawal seizures or DTs. He stated that 6 years ago was the only other time he passed out, after drinking heavy liquor. He denies SI/HI/AVH. Sleep/appetite are fair. No history of major depressive or manic episodes reported. He reports being diagnosed with generalized anxiety disorder by his PCP Dr. Mayford Knife, who has been prescribing xanax for anxiety (as well as metoprolol for HTN). Pt is now requesting to go home, because he does not feel that he needs detox. He reports having several obligations that he must attend to, including work and his family. No history of suicide attempts or psychiatric hospitalizations.  Per [facility] assessment: Eddie Torres is an 52 y.o. male. Patient presents calm and cooperative seeking alcohol detox; patient states that he drank too much today and passed out.  Patient  states that he only drinks beer, his age of 1st use was 52 yo; drinks about 1-2 (40 oz) beers every other day for the past several years; tonight he drank 3 (40 oz) beers after an upsetting conversation with his daughter where he found out that she was going to have to have surgery " because of some male issues." Patient states that he has never gone through detox before but he does have a history of passing out one other time 5-6 years ago after ingesting a large amount of liquor.     Past Psychiatric History: Past Medical History  Diagnosis Date  . Hypertension     reports that he has never smoked. He has never used smokeless tobacco. He reports that he drinks alcohol. He reports that he does not use illicit drugs. No family history on file. Family History Substance Abuse: Yes, Describe: Family Supports: Yes, List: (Sisters) Living Arrangements: Alone Can pt return to current living arrangement?: Yes Abuse/Neglect [facility]) Physical Abuse: Denies Verbal Abuse: Denies Sexual Abuse: Denies Allergies:   Allergies  Allergen Reactions  . Shellfish Allergy Anaphylaxis    ACT Assessment Complete:  Yes:    Educational Status    Risk to Self: Risk to self Suicidal Ideation: No Suicidal Intent: No Is patient at risk for suicide?: No Suicidal Plan?: No Access to Means: No What has been your use of drugs/alcohol within the last 12 months?: Every other day Previous Attempts/Gestures: No How many times?:  (Na) Other Self Harm Risks: Na Triggers for Past Attempts: None known Intentional Self Injurious  Behavior: None Family Suicide History: No Recent stressful life event(s): Other (Comment) (Daughter needs "male" surgery) Persecutory voices/beliefs?: No Depression: No Depression Symptoms:  (Na) Substance abuse history and/or treatment for substance abuse?: Yes Suicide prevention information given to non-admitted patients: Not applicable  Risk to Others: Risk to Others Homicidal  Ideation: No Thoughts of Harm to Others: No Current Homicidal Intent: No Current Homicidal Plan: No Access to Homicidal Means: No Identified Victim: Na History of harm to others?: No Assessment of Violence: None Noted Violent Behavior Description: Na Does patient have access to weapons?: No Criminal Charges Pending?: No Does patient have a court date: No  Abuse: Abuse/Neglect Assessment (Assessment to be complete while patient is alone) Physical Abuse: Denies Verbal Abuse: Denies Sexual Abuse: Denies Exploitation of patient/patient's resources: Denies Self-Neglect: Denies  Prior Inpatient Therapy: Prior Inpatient Therapy Prior Inpatient Therapy: No Prior Therapy Dates: Na Prior Therapy Facilty/Provider(s): Na Reason for Treatment: Na  Prior Outpatient Therapy: Prior Outpatient Therapy Prior Outpatient Therapy: No Prior Therapy Dates: Na Prior Therapy Facilty/Provider(s): Na Reason for Treatment: Na  Additional Information: Additional Information 1:1 In Past 12 Months?: No CIRT Risk: No Elopement Risk: No Does patient have medical clearance?: Yes                  Objective: Blood pressure 160/98, pulse 106, temperature 97.8 F (36.6 C), temperature source Oral, resp. rate 18, SpO2 97.00%.There is no height or weight on file to calculate BMI. Results for orders placed during the hospital encounter of 05/25/13 (from the past 72 hour(s))  ACETAMINOPHEN LEVEL     Status: None   Collection Time    05/25/13  5:15 PM      Result Value Range   Acetaminophen (Tylenol), Serum <15.0  10 - 30 ug/mL   Comment:            THERAPEUTIC CONCENTRATIONS VARY     SIGNIFICANTLY. A RANGE OF 10-30     ug/mL MAY BE AN EFFECTIVE     CONCENTRATION FOR MANY PATIENTS.     HOWEVER, SOME ARE BEST TREATED     AT CONCENTRATIONS OUTSIDE THIS     RANGE.     ACETAMINOPHEN CONCENTRATIONS     >150 ug/mL AT 4 HOURS AFTER     INGESTION AND >50 ug/mL AT 12     HOURS AFTER INGESTION ARE      OFTEN ASSOCIATED WITH TOXIC     REACTIONS.  CBC     Status: Abnormal   Collection Time    05/25/13  5:15 PM      Result Value Range   WBC 11.5 (*) 4.0 - 10.5 K/uL   RBC 5.16  4.22 - 5.81 MIL/uL   Hemoglobin 16.9  13.0 - 17.0 g/dL   HCT 62.1  30.8 - 65.7 %   MCV 91.9  78.0 - 100.0 fL   MCH 32.8  26.0 - 34.0 pg   MCHC 35.7  30.0 - 36.0 g/dL   RDW 84.6  96.2 - 95.2 %   Platelets 233  150 - 400 K/uL  COMPREHENSIVE METABOLIC PANEL     Status: Abnormal   Collection Time    05/25/13  5:15 PM      Result Value Range   Sodium 136  135 - 145 mEq/L   Potassium 4.2  3.5 - 5.1 mEq/L   Chloride 96  96 - 112 mEq/L   CO2 24  19 - 32 mEq/L   Glucose, Bld 114 (*) 70 -  99 mg/dL   BUN 9  6 - 23 mg/dL   Creatinine, Ser 7.82  0.50 - 1.35 mg/dL   Calcium 9.0  8.4 - 95.6 mg/dL   Total Protein 8.0  6.0 - 8.3 g/dL   Albumin 4.4  3.5 - 5.2 g/dL   AST 26  0 - 37 U/L   ALT 20  0 - 53 U/L   Alkaline Phosphatase 84  39 - 117 U/L   Total Bilirubin 0.4  0.3 - 1.2 mg/dL   GFR calc non Af Amer >90  >90 mL/min   GFR calc Af Amer >90  >90 mL/min   Comment: (NOTE)     The eGFR has been calculated using the CKD EPI equation.     This calculation has not been validated in all clinical situations.     eGFR's persistently <90 mL/min signify possible Chronic Kidney     Disease.  ETHANOL     Status: Abnormal   Collection Time    05/25/13  5:15 PM      Result Value Range   Alcohol, Ethyl (B) 350 (*) 0 - 11 mg/dL   Comment:            LOWEST DETECTABLE LIMIT FOR     SERUM ALCOHOL IS 11 mg/dL     FOR MEDICAL PURPOSES ONLY  SALICYLATE LEVEL     Status: Abnormal   Collection Time    05/25/13  5:15 PM      Result Value Range   Salicylate Lvl <2.0 (*) 2.8 - 20.0 mg/dL  TROPONIN I     Status: None   Collection Time    05/25/13  5:45 PM      Result Value Range   Troponin I <0.30  <0.30 ng/mL   Comment:            Due to the release kinetics of cTnI,     a negative result within the first hours     of the  onset of symptoms does not rule out     myocardial infarction with certainty.     If myocardial infarction is still suspected,     repeat the test at appropriate intervals.  URINE RAPID DRUG SCREEN (HOSP PERFORMED)     Status: None   Collection Time    05/25/13  7:09 PM      Result Value Range   Opiates NONE DETECTED  NONE DETECTED   Cocaine NONE DETECTED  NONE DETECTED   Benzodiazepines NONE DETECTED  NONE DETECTED   Amphetamines NONE DETECTED  NONE DETECTED   Tetrahydrocannabinol NONE DETECTED  NONE DETECTED   Barbiturates NONE DETECTED  NONE DETECTED   Comment:            DRUG SCREEN FOR MEDICAL PURPOSES     ONLY.  IF CONFIRMATION IS NEEDED     FOR ANY PURPOSE, NOTIFY LAB     WITHIN 5 DAYS.                LOWEST DETECTABLE LIMITS     FOR URINE DRUG SCREEN     Drug Class       Cutoff (ng/mL)     Amphetamine      1000     Barbiturate      200     Benzodiazepine   200     Tricyclics       300     Opiates          300  Cocaine          300     THC              50   Labs are reviewed and are pertinent for BAL was 350 yesterday. UDS negative.  Current Facility-Administered Medications  Medication Dose Route Frequency Provider Last Rate Last Dose  . acetaminophen (TYLENOL) tablet 650 mg  650 mg Oral Q4H PRN Juliet Rude. Pickering, MD      . ibuprofen (ADVIL,MOTRIN) tablet 600 mg  600 mg Oral Q8H PRN Juliet Rude. Pickering, MD      . LORazepam (ATIVAN) tablet 0-4 mg  0-4 mg Oral Q6H Nathan R. Pickering, MD   2 mg at 05/26/13 4098   Followed by  . [START ON 05/28/2013] LORazepam (ATIVAN) tablet 0-4 mg  0-4 mg Oral Q12H Nathan R. Pickering, MD      . ondansetron [facility]) tablet 4 mg  4 mg Oral Q8H PRN Juliet Rude. Rubin Payor, MD       Current Outpatient Prescriptions  Medication Sig Dispense Refill  . ibuprofen (ADVIL,MOTRIN) 600 MG tablet Take 600 mg by mouth every 6 (six) hours as needed (pain).        Psychiatric Specialty Exam:     Blood pressure 160/98, pulse 106, temperature  97.8 F (36.6 C), temperature source Oral, resp. rate 18, SpO2 97.00%.There is no height or weight on file to calculate BMI.  General Appearance: Casual and Disheveled  Eye Contact::  Fair  Speech:  Clear and Coherent and Normal Rate  Volume:  Normal  Mood:  Euthymic  Affect:  Appropriate, Congruent and Restricted  Thought Process:  Coherent and Goal Directed  Orientation:  Full (Time, Place, and Person)  Thought Content:  Hallucinations: None  Suicidal Thoughts:  No  Homicidal Thoughts:  No  Memory:  Immediate;   Fair Recent;   Fair Remote;   Fair  Judgement:  Intact  Insight:  Fair  Psychomotor Activity:  Normal  Concentration:  Fair  Recall:  Fair  Akathisia:  Negative  Handed:  Right  AIMS (if indicated):     Assets:  Communication Skills Desire for Improvement Financial Resources/Insurance Social Support Vocational/Educational  Sleep:      Treatment Plan Summary: Pt is willing to stay in the ED overnight to monitor his vital signs. Consulted with EDP, who suggested we start metoprolol 50 mg BID for HTN (since he has not taken this med for 2 months). Will continue ativan protocol for alcohol withdrawal symptoms. If vital signs are more stable tomorrow, will plan to discharge to home tomorrow. Pt does not feel that he needs detox, since he does not drink daily. He will need to be given outpatient referrals for substance abuse counselors. He was advised not to take xanax in combination with alcohol, due to risk of respiratory depression. He should follow up with his PCP for treatment of anxiety and HTN.   Ancil Linsey 05/26/2013 11:43 AM

## 2013-05-26 NOTE — ED Provider Notes (Signed)
Pt alert, content. Pt ambulatory in psych ed w steady gait. Is eating and drinking. Normal mood and affect. No tremor or shakes. Hr 92 rr 16.  Pt requests d/c.  States he binge drinks a few times a week, sometimes once a week, but denies daily etoh use/abuse.  He denies hx complicated etoh withdrawal, seizures or dts. He denies feeling depressed or having any thoughts of harm to self or others.  He states has a stable place to live, in rooming house.  States owner is a religious person who does not allow any etoh or drugs in the home. Pt refuses inpatient rehab/detox. Offered x 2, pt declining each time. Denies any acute health problems, but requests refill of his chronic htn med (metoprolol - states once a day, and bzd med for nerves/chr anxiety).  States has a pcp on [address], Dr Mayford Knife, states can follow up there.   Pt appears stable for d/c, and refuses to stay for inpatient rehab/detox.     Suzi Roots, MD 05/26/13 [phone]

## 2013-05-26 NOTE — BH Assessment (Signed)
Assessment Note  Eddie Torres is an 52 y.o. male. Patient presents calm and cooperative seeking alcohol detox; patient states that he drank too much today and passed out.  Patient states that he only drinks beer, his age of 1st use was 52 yo; drinks about 1-2 (40 oz) beers every other day for the past several years; tonight he drank 3 (40 oz) beers after an upsetting  conversation with his daughter where he found out that she was going to have to have surgery " because of some male issues." Patient states that he has never gone through detox before but he does have a history of passing out one other time 5-6 years ago after ingesting a large amount of liquor.   Patient states that he can not believe that he passed out and admits to really being unsure of exactly how much he drinks.He states that this episode today "scared" him and has led him to seek detox.   Axis I: Alcohol Dependence Axis II: Deferred Axis III:  Past Medical History  Diagnosis Date  . Hypertension    Axis IV: economic problems, housing problems, occupational problems, other psychosocial or environmental problems and problems related to social environment Axis V: 45  Past Medical History:  Past Medical History  Diagnosis Date  . Hypertension     History reviewed. No pertinent past surgical history.  Family History: No family history on file.  Social History:  reports that he has never smoked. He has never used smokeless tobacco. He reports that he drinks alcohol. He reports that he does not use illicit drugs.  Additional Social History:  Alcohol / Drug Use History of alcohol / drug use?: Yes Negative Consequences of Use: Personal relationships Withdrawal Symptoms: Blackouts  CIWA: CIWA-Ar BP: 125/79 mmHg Pulse Rate: 106 Nausea and Vomiting: no nausea and no vomiting Tactile Disturbances: very mild itching, pins and needles, burning or numbness Tremor: moderate, with patient's arms extended Auditory  Disturbances: not present Paroxysmal Sweats: three Visual Disturbances: not present Anxiety: moderately anxious, or guarded, so anxiety is inferred Headache, Fullness in Head: none present Agitation: normal activity Orientation and Clouding of Sensorium: oriented and can do serial additions CIWA-Ar Total: 12 COWS:    Allergies:  Allergies  Allergen Reactions  . Shellfish Allergy Anaphylaxis    Home Medications:  (Not in a hospital admission)  OB/GYN Status:  No LMP for male patient.  General Assessment Data Location of Assessment: [facility] ED Is this a Tele or Face-to-Face Assessment?: Face-to-Face Is this an Initial Assessment or a Re-assessment for this encounter?: Initial Assessment Living Arrangements: Alone Can pt return to current living arrangement?: Yes Admission Status: Voluntary Is patient capable of signing voluntary admission?: Yes Transfer from: Home Referral Source: MD  Medical Screening Exam [facility] Walk-in ONLY) Medical Exam completed: Yes  [facility] Crisis Care Plan Living Arrangements: Alone Name of Psychiatrist: None Name of Therapist: None  Education Status Is patient currently in school?: No Current Grade: Na Highest grade of school patient has completed: 12th grade Name of school: [employer] person:  Kathlene November Cangas/ [phone])  Risk to self Suicidal Ideation: No Suicidal Intent: No Is patient at risk for suicide?: No Suicidal Plan?: No Access to Means: No What has been your use of drugs/alcohol within the last 12 months?: Every other day Previous Attempts/Gestures: No How many times?:  (Na) Other Self Harm Risks: Na Triggers for Past Attempts: None known Intentional Self Injurious Behavior: None Family Suicide History: No Recent stressful  life event(s): Other (Comment) (Daughter needs "male" surgery) Persecutory voices/beliefs?: No Depression: No Depression Symptoms:  (Na) Substance abuse history and/or treatment for substance  abuse?: No Suicide prevention information given to non-admitted patients: Not applicable  Risk to Others Homicidal Ideation: No Thoughts of Harm to Others: No Current Homicidal Intent: No Current Homicidal Plan: No Access to Homicidal Means: No Identified Victim: Na History of harm to others?: No Assessment of Violence: None Noted Violent Behavior Description: Na Does patient have access to weapons?: No Criminal Charges Pending?: No Does patient have a court date: No  Psychosis Hallucinations: None noted Delusions: None noted  Mental Status Report Appear/Hygiene: Disheveled Eye Contact: Fair Motor Activity: Freedom of movement;Unremarkable Speech: Logical/coherent Level of Consciousness: Alert Mood:  (Attentive) Affect: Appropriate to circumstance Anxiety Level: None Thought Processes: Coherent;Relevant Judgement: Unimpaired Orientation: Person;Place;Time;Situation;Appropriate for developmental age Obsessive Compulsive Thoughts/Behaviors: None  Cognitive Functioning Concentration: Decreased Memory: Recent Intact;Remote Intact IQ: Average Insight: Fair Impulse Control: Fair Appetite: Good Weight Loss:  (None noted) Weight Gain:  (None noted) Sleep: No Change Total Hours of Sleep:  (5-6 hours/ night) Vegetative Symptoms: None  ADLScreening [facility] Assessment Services) Patient's cognitive ability adequate to safely complete daily activities?: Yes Patient able to express need for assistance with ADLs?: Yes Independently performs ADLs?: Yes (appropriate for developmental age)  Prior Inpatient Therapy Prior Inpatient Therapy: No Prior Therapy Dates: Na Prior Therapy Facilty/Provider(s): Na Reason for Treatment: Na  Prior Outpatient Therapy Prior Outpatient Therapy: No Prior Therapy Dates: Na Prior Therapy Facilty/Provider(s): Na Reason for Treatment: Na  ADL Screening (condition at time of admission) Patient's cognitive ability adequate to safely complete  daily activities?: Yes Is the patient deaf or have difficulty hearing?: No Does the patient have difficulty seeing, even when wearing glasses/contacts?: No Does the patient have difficulty concentrating, remembering, or making decisions?: No Patient able to express need for assistance with ADLs?: Yes Does the patient have difficulty dressing or bathing?: No Independently performs ADLs?: Yes (appropriate for developmental age) Does the patient have difficulty walking or climbing stairs?: No Weakness of Legs: None Weakness of Arms/Hands: None  Home Assistive Devices/Equipment Home Assistive Devices/Equipment: None  Therapy Consults (therapy consults require a physician order) PT Evaluation Needed: No OT Evalulation Needed: No SLP Evaluation Needed: No Abuse/Neglect Assessment (Assessment to be complete while patient is alone) Physical Abuse: Denies Verbal Abuse: Denies Sexual Abuse: Denies Exploitation of patient/patient's resources: Denies Self-Neglect: Denies Values / Beliefs Cultural Requests During Hospitalization: None Spiritual Requests During Hospitalization: None Consults Spiritual Care Consult Needed: No Social Work Consult Needed: No      Additional Information 1:1 In Past 12 Months?: No CIRT Risk: No Elopement Risk: No Does patient have medical clearance?: Yes     Disposition:  Disposition Initial Assessment Completed for this Encounter: Yes Disposition of Patient: Inpatient treatment program Type of inpatient treatment program: Adult  On Site Evaluation by:   Reviewed with Physician:    Rudi Coco 05/26/2013 12:03 AM

## 2013-10-28 ENCOUNTER — Emergency Department (HOSPITAL_COMMUNITY): Payer: No Typology Code available for payment source

## 2013-10-28 ENCOUNTER — Emergency Department (HOSPITAL_COMMUNITY)
Admission: EM | Admit: 2013-10-28 | Discharge: 2013-10-28 | Discharge status: Against Medical Advice | Payer: No Typology Code available for payment source | Attending: Emergency Medicine | Admitting: Emergency Medicine

## 2013-10-28 ENCOUNTER — Encounter (HOSPITAL_COMMUNITY): Payer: Self-pay | Admitting: Emergency Medicine

## 2013-10-28 DIAGNOSIS — IMO0002 Reserved for concepts with insufficient information to code with codable children: Secondary | ICD-10-CM | POA: Insufficient documentation

## 2013-10-28 DIAGNOSIS — I10 Essential (primary) hypertension: Secondary | ICD-10-CM | POA: Insufficient documentation

## 2013-10-28 DIAGNOSIS — Y9289 Other specified places as the place of occurrence of the external cause: Secondary | ICD-10-CM | POA: Insufficient documentation

## 2013-10-28 DIAGNOSIS — W19XXXA Unspecified fall, initial encounter: Secondary | ICD-10-CM

## 2013-10-28 DIAGNOSIS — R296 Repeated falls: Secondary | ICD-10-CM | POA: Insufficient documentation

## 2013-10-28 DIAGNOSIS — S298XXA Other specified injuries of thorax, initial encounter: Secondary | ICD-10-CM | POA: Insufficient documentation

## 2013-10-28 DIAGNOSIS — R42 Dizziness and giddiness: Secondary | ICD-10-CM | POA: Insufficient documentation

## 2013-10-28 DIAGNOSIS — F10929 Alcohol use, unspecified with intoxication, unspecified: Secondary | ICD-10-CM

## 2013-10-28 DIAGNOSIS — Y9389 Activity, other specified: Secondary | ICD-10-CM | POA: Insufficient documentation

## 2013-10-28 DIAGNOSIS — Z791 Long term (current) use of non-steroidal anti-inflammatories (NSAID): Secondary | ICD-10-CM | POA: Insufficient documentation

## 2013-10-28 DIAGNOSIS — F1010 Alcohol abuse, uncomplicated: Secondary | ICD-10-CM | POA: Insufficient documentation

## 2013-10-28 LAB — COMPREHENSIVE METABOLIC PANEL
ALT: 24 U/L (ref 0–53)
AST: 23 U/L (ref 0–37)
Albumin: 3.9 g/dL (ref 3.5–5.2)
Alkaline Phosphatase: 64 U/L (ref 39–117)
BILIRUBIN TOTAL: 0.3 mg/dL (ref 0.3–1.2)
BUN: 9 mg/dL (ref 6–23)
CHLORIDE: 106 meq/L (ref 96–112)
CO2: 23 meq/L (ref 19–32)
CREATININE: 0.8 mg/dL (ref 0.50–1.35)
Calcium: 8.4 mg/dL (ref 8.4–10.5)
GLUCOSE: 96 mg/dL (ref 70–99)
Potassium: 4.1 mEq/L (ref 3.7–5.3)
Sodium: 143 mEq/L (ref 137–147)
Total Protein: 7.5 g/dL (ref 6.0–8.3)

## 2013-10-28 LAB — I-STAT TROPONIN, ED: Troponin i, poc: 0.01 ng/mL (ref 0.00–0.08)

## 2013-10-28 LAB — CBC
HEMATOCRIT: 44.6 % (ref 39.0–52.0)
Hemoglobin: 15.7 g/dL (ref 13.0–17.0)
MCH: 34 pg (ref 26.0–34.0)
MCHC: 35.2 g/dL (ref 30.0–36.0)
MCV: 96.5 fL (ref 78.0–100.0)
Platelets: 179 10*3/uL (ref 150–400)
RBC: 4.62 MIL/uL (ref 4.22–5.81)
RDW: 12.5 % (ref 11.5–15.5)
WBC: 4.5 10*3/uL (ref 4.0–10.5)

## 2013-10-28 LAB — ETHANOL: ALCOHOL ETHYL (B): 346 mg/dL — AB (ref 0–11)

## 2013-10-28 MED ORDER — SODIUM CHLORIDE 0.9 % IV BOLUS (SEPSIS)
1000.0000 mL | Freq: Once | INTRAVENOUS | Status: AC
  Administered 2013-10-28: 1000 mL via INTRAVENOUS

## 2013-10-28 NOTE — Progress Notes (Signed)
Weekend CSW attempted to complete substance abuse assessment with patient. CSW spoke with RN who states that patient is currently intoxicated. CSW will attempt to complete assessment at later time.  Samuella BruinKristin Genevive Printup, MSW, LCSWA Clinical Social Worker [facility] [phone]

## 2013-10-28 NOTE — ED Notes (Signed)
Pt appears intoxicated. Reports that he got off work yesterday at 1630 and "I bought some 40's." Pt believes that is is still Friday, but pt reoriented that today is Saturday. 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 NAD. VSS. PA at bedside.

## 2013-10-28 NOTE — ED Notes (Signed)
PA Abigail at bedside.  

## 2013-10-28 NOTE — ED Notes (Signed)
Pt not in room,walked out on his own,did not wait for d/c instructions. Pt. Left AMA.

## 2013-10-28 NOTE — ED Notes (Signed)
Patient transported to CT 

## 2013-10-28 NOTE — ED Notes (Signed)
CT collar removed by PA Arthor CaptainAbigail Harris

## 2013-10-28 NOTE — ED Provider Notes (Signed)
CSN: [id number]     Arrival date & time 10/28/13  1406 History   First MD Initiated Contact with Patient 10/28/13 1411     Chief Complaint  Patient presents with  . Alcohol Intoxication  . Chest Pain  . Fall     (Consider location/radiation/quality/duration/timing/severity/associated sxs/prior Treatment) The history is provided by the patient. No language interpreter was used.    Level 5 caveat due to intoxication.  Eddie Torres is a(n) 53 y.o. male who presents via EMS for alcohol intoxication and fall. History is provided by EMS. Patient brought in by EMS for a. The patient very ataxic after drinking a case of beer this morning. He fell multiple times and the last time a friend reports he fell directly onto his face. He did not appear to be unconscious at any point. The patient was brought in by EMS and began complaining of chest pain. He was unable to tell EMS if the pain had just come on suddenly R. had been present for more than a year because he stated that both of those were accurate. During the initial intake patient date when asked if he has chest pain "we're asking too many questions." The patient brought in on long spine board with C. collar and cervical precautions. we cannot clear his C-spine diffuse  alcohol intoxication. Past Medical History  Diagnosis Date  . Hypertension    History reviewed. No pertinent past surgical history. History reviewed. No pertinent family history. History  Substance Use Topics  . Smoking status: Never Smoker   . Smokeless tobacco: Never Used  . Alcohol Use: Yes    Review of Systems  Ten systems reviewed and are negative for acute change, except as noted in the HPI.    Allergies  Shellfish allergy  Home Medications   Prior to Admission medications   Medication Sig Start Date End Date Taking? Authorizing Provider  ibuprofen (ADVIL,MOTRIN) 600 MG tablet Take 600 mg by mouth every 6 (six) hours as needed (pain).   Yes Historical  Provider, MD   BP 141/97  Pulse 85  Resp 14  SpO2 95% Physical Exam Physical Exam  Nursing note and vitals reviewed. Constitutional: He appears well-developed and well-nourished. Appears intoxicated HENT:  Head: Normocephalic and small abrasion to the bridge of the nose. No crepitus, no step-off, no deformities. Eyes: Conjunctivae normal are normal. No scleral icterus.  Neck: Cervical collar present.  Cardiovascular: Normal rate, regular rhythm and normal heart sounds.   Pulmonary/Chest: Effort normal and breath sounds normal. No respiratory distress.  Abdominal: Soft. There is no tenderness.  Musculoskeletal: He exhibits no edema.  no midline spinal tenderness  Neurological: He is alert.  Skin: Skin is warm and dry. He is not diaphoretic.  Psychiatric: His behavior is normal.    ED Course  Procedures (including critical care time) Labs Review Labs Reviewed  CBC  COMPREHENSIVE METABOLIC PANEL  ETHANOL  Rosezena SensorI-STAT TROPOININ, ED    Imaging Review Dg Chest Port 1 View  10/28/2013   CLINICAL DATA:  Fall off ladder. Chest pain. Dizziness. Hypertension.  EXAM: PORTABLE CHEST - 1 VIEW  COMPARISON:  05/25/2013  FINDINGS: The heart size and mediastinal contours are within normal limits. Both lungs are clear. No evidence of pneumothorax or hemothorax. Old left posterior rib fracture deformities again noted.  IMPRESSION: No active disease.   Electronically Signed   By: Myles RosenthalJohn  Stahl M.D.   On: 10/28/2013 14:44     EKG Interpretation   Date/Time:  Saturday  Oct 28 2013 14:19:04 EDT Ventricular Rate:  97 PR Interval:  123 QRS Duration: 92 QT Interval:  336 QTC Calculation: 427 R Axis:   86 Text Interpretation:  Sinus rhythm Nonspecific T abnormalities, inferior  leads Anteroseptal infarct , old No significant change since last tracing  Confirmed by KNAPP  MD-I, IVA ([id number]) on 10/28/2013 2:48:24 PM      MDM   Final diagnoses:  None   3:16 PM Filed Vitals:   10/28/13 1445  BP:  141/97  Pulse: 85  Resp: 14    Impression alcohol intoxication. Awaiting CT C-spine and head. He will remain in his cervical collar until that time. EKG is without any acute changes and a negative troponin. His chest x-ray is also without acute abnormality.I personally reviewed the images using our PACS system.   5:47 PM BP 108/64  Pulse 81  Resp 12  SpO2 97% Patient still clinically intoxicated. He was stood to ambulate and still very ataxic.   8:00 PM BP 153/85  Pulse 86  Temp(Src) 98.8 F (37.1 C) (Oral)  Resp 16  SpO2 95% Patient appears clinically sober. Ambulating in the ED. He has a headache and asks for tylenol.      Arthor CaptainAbigail Knolan Simien, PA-C 10/29/13 [phone]

## 2013-10-28 NOTE — ED Notes (Signed)
Pt attempting to get out of bed in order to urinate. Pt reminded that he has a urinal and call bell is in reach. Pt noted to be unsteady on his feet. Pt left with door open for safety. Pt remains calm and cooperative. VSS.

## 2013-10-28 NOTE — ED Notes (Signed)
Per [facility] pt from home, pt presents with intermittent central chest pain x1-2 weeks, per family pt has been drinking all day and has fallen 2 times face first into the cement, pt has abrasion to his face. Pt also reports he ran out of his BP medications 1-2 days or a month ago. Pt placed on LSB, c-collar and head blocks due to multiple falls today. 18 G LFA administered 100 cc of NS, BP 122/78, HR 100, RR 18, 98% RA

## 2013-10-29 NOTE — ED Provider Notes (Signed)
Medical screening examination/treatment/procedure(s) were performed by non-physician practitioner and as supervising physician I was immediately available for consultation/collaboration.   EKG Interpretation   Date/Time:  Saturday Oct 28 2013 14:19:04 EDT Ventricular Rate:  97 PR Interval:  123 QRS Duration: 92 QT Interval:  336 QTC Calculation: 427 R Axis:   86 Text Interpretation:  Sinus rhythm Nonspecific T abnormalities, inferior  leads Anteroseptal infarct , old No significant change since last tracing  Confirmed by Yogi Arther  MD-I, Charles Niese ([id number]) on 10/28/2013 2:48:24 PM      Eddie AlbeIva Rim Thatch, MD, Armando GangFACEP   Ward GivensIva L Jason Frisbee, MD 10/29/13 1100

## 2014-05-12 ENCOUNTER — Encounter (HOSPITAL_COMMUNITY): Payer: Self-pay | Admitting: Emergency Medicine

## 2014-05-12 ENCOUNTER — Emergency Department (HOSPITAL_COMMUNITY)
Admission: EM | Admit: 2014-05-12 | Discharge: 2014-05-13 | Disposition: A | Discharge status: Home / Self Care | Payer: No Typology Code available for payment source | Attending: Emergency Medicine | Admitting: Emergency Medicine

## 2014-05-12 ENCOUNTER — Emergency Department (HOSPITAL_COMMUNITY): Payer: No Typology Code available for payment source

## 2014-05-12 DIAGNOSIS — Y998 Other external cause status: Secondary | ICD-10-CM | POA: Insufficient documentation

## 2014-05-12 DIAGNOSIS — F10929 Alcohol use, unspecified with intoxication, unspecified: Secondary | ICD-10-CM

## 2014-05-12 DIAGNOSIS — Y9389 Activity, other specified: Secondary | ICD-10-CM | POA: Diagnosis not present

## 2014-05-12 DIAGNOSIS — F10129 Alcohol abuse with intoxication, unspecified: Secondary | ICD-10-CM | POA: Insufficient documentation

## 2014-05-12 DIAGNOSIS — W228XXA Striking against or struck by other objects, initial encounter: Secondary | ICD-10-CM | POA: Insufficient documentation

## 2014-05-12 DIAGNOSIS — Y9289 Other specified places as the place of occurrence of the external cause: Secondary | ICD-10-CM | POA: Diagnosis not present

## 2014-05-12 DIAGNOSIS — I10 Essential (primary) hypertension: Secondary | ICD-10-CM | POA: Insufficient documentation

## 2014-05-12 DIAGNOSIS — S0083XA Contusion of other part of head, initial encounter: Secondary | ICD-10-CM | POA: Insufficient documentation

## 2014-05-12 DIAGNOSIS — S0990XA Unspecified injury of head, initial encounter: Secondary | ICD-10-CM | POA: Diagnosis not present

## 2014-05-12 DIAGNOSIS — R000 Tachycardia, unspecified: Secondary | ICD-10-CM | POA: Insufficient documentation

## 2014-05-12 HISTORY — DX: Alcohol abuse, uncomplicated: F10.10

## 2014-05-12 LAB — I-STAT CHEM 8, ED
BUN: 6 mg/dL (ref 6–23)
Calcium, Ion: 1.03 mmol/L — ABNORMAL LOW (ref 1.12–1.23)
Chloride: 102 mEq/L (ref 96–112)
Creatinine, Ser: 1.3 mg/dL (ref 0.50–1.35)
Glucose, Bld: 104 mg/dL — ABNORMAL HIGH (ref 70–99)
HCT: 45 % (ref 39.0–52.0)
Hemoglobin: 15.3 g/dL (ref 13.0–17.0)
Potassium: 3.7 mEq/L (ref 3.7–5.3)
SODIUM: 137 meq/L (ref 137–147)
TCO2: 20 mmol/L (ref 0–100)

## 2014-05-12 LAB — ETHANOL: ALCOHOL ETHYL (B): 341 mg/dL — AB (ref 0–11)

## 2014-05-12 NOTE — ED Notes (Signed)
Resting quietly with eyes closed. Easily arousable. Verbally responsive. Resp even and unlabored. Noted snoring resp at times. Continues to have ETOH odor.

## 2014-05-12 NOTE — ED Notes (Signed)
Pt BIB EMS. Pt had 4 40oz beers today. Pt staggered into back yard and ran into a swing set and hit his head. Denies LOC. Pt has hematoma over R eye and swelling to R cheek. A/o to baseline per family members. Pt alert, no acute distress. Skin warm and dry.

## 2014-05-12 NOTE — ED Notes (Signed)
Pt awake. A/O x4. Verbally responsive. Resp even and unlabored. ABC's intact. Pt requesting to speak with MD. Pt given sandwich/Spirite. Tolerated meal well. NAD.

## 2014-05-12 NOTE — ED Notes (Signed)
Bed: [id number] Expected date:  Expected time:  Means of arrival:  Comments: Fall, Head injury, ETOH

## 2014-05-12 NOTE — ED Provider Notes (Signed)
CSN: [id number]     Arrival date & time 05/12/14  1819 History   First MD Initiated Contact with Patient 05/12/14 1832     Chief Complaint  Patient presents with  . Alcohol Intoxication  . Head Injury     (Consider location/radiation/quality/duration/timing/severity/associated sxs/prior Treatment) HPI Comments: Patient is a 53 year old male with a past medical history of hypertension and alcohol abuse who presents with alcohol intoxication and head injury. Per EMS, patient staggered into the back yard and ran into a swing set, hitting his head. No LOC. Patient's family witnessed the injury. Patient is a poor historian due to alcohol intoxication and denies any pain at this time. Patient denies any other injury. He does admit to drinking 4-40 oz beers today. No other associated symptoms.    Past Medical History  Diagnosis Date  . Hypertension   . ETOH abuse    No past surgical history on file. No family history on file. History  Substance Use Topics  . Smoking status: Never Smoker   . Smokeless tobacco: Never Used  . Alcohol Use: Yes    Review of Systems  Constitutional: Negative for fever, chills and fatigue.  HENT: Negative for trouble swallowing.   Eyes: Negative for visual disturbance.  Respiratory: Negative for shortness of breath.   Cardiovascular: Negative for chest pain and palpitations.  Gastrointestinal: Negative for nausea, vomiting, abdominal pain and diarrhea.  Genitourinary: Negative for dysuria and difficulty urinating.  Musculoskeletal: Negative for arthralgias and neck pain.  Skin: Positive for wound. Negative for color change.  Neurological: Positive for headaches. Negative for dizziness and weakness.  Psychiatric/Behavioral: Negative for dysphoric mood.      Allergies  Shellfish allergy  Home Medications   Prior to Admission medications   Medication Sig Start Date End Date Taking? Authorizing Provider  ibuprofen (ADVIL,MOTRIN) 600 MG tablet Take  600 mg by mouth every 6 (six) hours as needed (pain).    Historical Provider, MD   BP 163/115 mmHg  Pulse 110  Temp(Src) 97.8 F (36.6 C) (Oral)  Resp 20  SpO2 94% Physical Exam  Constitutional: He is oriented to person, place, and time. He appears well-developed and well-nourished. No distress.  HENT:  Head: Normocephalic and atraumatic.  Right forehead hematoma with punctate wound with bleeding controlled.   Eyes: Conjunctivae and EOM are normal. Pupils are equal, round, and reactive to light.  Neck: Normal range of motion.  Cardiovascular: Normal rate and regular rhythm.  Exam reveals no gallop and no friction rub.   No murmur heard. Pulmonary/Chest: Effort normal and breath sounds normal. He has no wheezes. He has no rales. He exhibits no tenderness.  Abdominal: Soft. He exhibits no distension. There is no tenderness.  Musculoskeletal: Normal range of motion.  Neurological: He is alert and oriented to person, place, and time. No cranial nerve deficit. Coordination normal.  Extremity strength and sensation equal and intact bilaterally. Speech is goal-oriented. Moves limbs without ataxia.   Skin: Skin is warm and dry.  Psychiatric: He has a normal mood and affect. His behavior is normal.  Nursing note and vitals reviewed.   ED Course  Procedures (including critical care time) Labs Review Labs Reviewed - No data to display  Imaging Review Ct Head Wo Contrast  05/12/2014   CLINICAL DATA:  Ethanol abuse. Hit swing set with head. Hematoma or right orbit. Swelling right cheek.  EXAM: CT HEAD WITHOUT CONTRAST  CT MAXILLOFACIAL WITHOUT CONTRAST  CT CERVICAL SPINE WITHOUT CONTRAST  TECHNIQUE:  Multidetector CT imaging of the head, cervical spine, and maxillofacial structures were performed using the standard protocol without intravenous contrast. Multiplanar CT image reconstructions of the cervical spine and maxillofacial structures were also generated.  COMPARISON:  10/28/2013  FINDINGS:  CT HEAD FINDINGS  No acute intracranial abnormality. Specifically, no hemorrhage, hydrocephalus, mass lesion, acute infarction, or significant intracranial injury. No acute calvarial abnormality. Visualized paranasal sinuses and mastoids clear. Orbital soft tissues unremarkable.  CT MAXILLOFACIAL FINDINGS  No evidence of facial fracture or orbital fracture. Zygomatic arches and mandible are intact. Orbital soft tissues are unremarkable. Paranasal sinuses are and mastoid air cells are clear.  CT CERVICAL SPINE FINDINGS  Normal alignment. Degenerative disc disease at C5-6 and C6-7. Mild degenerative facet disease bilaterally. No fracture or subluxation. No epidural or paraspinal hematoma.  IMPRESSION: No acute intracranial abnormality.  No acute bony abnormality in the face or cervical spine.   Electronically Signed   By: Charlett Nose M.D.   On: 05/12/2014 19:11   Ct Cervical Spine Wo Contrast  05/12/2014   CLINICAL DATA:  Ethanol abuse. Hit swing set with head. Hematoma or right orbit. Swelling right cheek.  EXAM: CT HEAD WITHOUT CONTRAST  CT MAXILLOFACIAL WITHOUT CONTRAST  CT CERVICAL SPINE WITHOUT CONTRAST  TECHNIQUE: Multidetector CT imaging of the head, cervical spine, and maxillofacial structures were performed using the standard protocol without intravenous contrast. Multiplanar CT image reconstructions of the cervical spine and maxillofacial structures were also generated.  COMPARISON:  10/28/2013  FINDINGS: CT HEAD FINDINGS  No acute intracranial abnormality. Specifically, no hemorrhage, hydrocephalus, mass lesion, acute infarction, or significant intracranial injury. No acute calvarial abnormality. Visualized paranasal sinuses and mastoids clear. Orbital soft tissues unremarkable.  CT MAXILLOFACIAL FINDINGS  No evidence of facial fracture or orbital fracture. Zygomatic arches and mandible are intact. Orbital soft tissues are unremarkable. Paranasal sinuses are and mastoid air cells are clear.  CT  CERVICAL SPINE FINDINGS  Normal alignment. Degenerative disc disease at C5-6 and C6-7. Mild degenerative facet disease bilaterally. No fracture or subluxation. No epidural or paraspinal hematoma.  IMPRESSION: No acute intracranial abnormality.  No acute bony abnormality in the face or cervical spine.   Electronically Signed   By: Charlett Nose M.D.   On: 05/12/2014 19:11   Ct Maxillofacial Wo Cm  05/12/2014   CLINICAL DATA:  Ethanol abuse. Hit swing set with head. Hematoma or right orbit. Swelling right cheek.  EXAM: CT HEAD WITHOUT CONTRAST  CT MAXILLOFACIAL WITHOUT CONTRAST  CT CERVICAL SPINE WITHOUT CONTRAST  TECHNIQUE: Multidetector CT imaging of the head, cervical spine, and maxillofacial structures were performed using the standard protocol without intravenous contrast. Multiplanar CT image reconstructions of the cervical spine and maxillofacial structures were also generated.  COMPARISON:  10/28/2013  FINDINGS: CT HEAD FINDINGS  No acute intracranial abnormality. Specifically, no hemorrhage, hydrocephalus, mass lesion, acute infarction, or significant intracranial injury. No acute calvarial abnormality. Visualized paranasal sinuses and mastoids clear. Orbital soft tissues unremarkable.  CT MAXILLOFACIAL FINDINGS  No evidence of facial fracture or orbital fracture. Zygomatic arches and mandible are intact. Orbital soft tissues are unremarkable. Paranasal sinuses are and mastoid air cells are clear.  CT CERVICAL SPINE FINDINGS  Normal alignment. Degenerative disc disease at C5-6 and C6-7. Mild degenerative facet disease bilaterally. No fracture or subluxation. No epidural or paraspinal hematoma.  IMPRESSION: No acute intracranial abnormality.  No acute bony abnormality in the face or cervical spine.   Electronically Signed   By: Charlett Nose M.D.   On:  05/12/2014 19:11     EKG Interpretation None      MDM   Final diagnoses:  Head injury  Alcohol intoxication   7:11 PM CT head, neck and face  pending. Patient mildly tachycardic with remaining vitals stable.   12:55 AM CT unremarkable for acute changes. Patient will be discharged in the morning when he is able to have someone pick him up.   Emilia BeckKaitlyn Amaira Safley, PA-C 05/13/14 0056  Hurman HornJohn M Bednar, MD 05/17/14 0201

## 2014-05-12 NOTE — ED Notes (Signed)
Patient transported to CT 

## 2014-05-13 MED ORDER — ACETAMINOPHEN 325 MG PO TABS
650.0000 mg | ORAL_TABLET | Freq: Once | ORAL | Status: AC
  Administered 2014-05-13: 650 mg via ORAL
  Filled 2014-05-13: qty 2

## 2014-05-13 NOTE — ED Notes (Signed)
Awake. Verbally responsive. Resp even and unlabored. ABC's intact. NAD noted. 

## 2014-05-13 NOTE — Discharge Instructions (Signed)
Your lab results and CT scans are unremarkable for acute changes. Refer to attached documents for more information. Return to the ED with worsening or concerning symptoms.  Alcohol Intoxication Alcohol intoxication occurs when the amount of alcohol that a person has consumed impairs his or her ability to mentally and physically function. Alcohol directly impairs the normal chemical activity of the brain. Drinking large amounts of alcohol can lead to changes in mental function and behavior, and it can cause many physical effects that can be harmful.  Alcohol intoxication can range in severity from mild to very severe. Various factors can affect the level of intoxication that occurs, such as the person's age, gender, weight, frequency of alcohol consumption, and the presence of other medical conditions (such as diabetes, seizures, or heart conditions). Dangerous levels of alcohol intoxication may occur when people drink large amounts of alcohol in a short period (binge drinking). Alcohol can also be especially dangerous when combined with certain prescription medicines or "recreational" drugs. SIGNS AND SYMPTOMS Some common signs and symptoms of mild alcohol intoxication include:  Loss of coordination.  Changes in mood and behavior.  Impaired judgment.  Slurred speech. As alcohol intoxication progresses to more severe levels, other signs and symptoms will appear. These may include:  Vomiting.  Confusion and impaired memory.  Slowed breathing.  Seizures.  Loss of consciousness. DIAGNOSIS  Your health care provider will take a medical history and perform a physical exam. You will be asked about the amount and type of alcohol you have consumed. Blood tests will be done to measure the concentration of alcohol in your blood. In many places, your blood alcohol level must be lower than 80 mg/dL (6.96%0.08%) to legally drive. However, many dangerous effects of alcohol can occur at much lower levels.    TREATMENT  People with alcohol intoxication often do not require treatment. Most of the effects of alcohol intoxication are temporary, and they go away as the alcohol naturally leaves the body. Your health care provider will monitor your condition until you are stable enough to go home. Fluids are sometimes given through an IV access tube to help prevent dehydration.  HOME CARE INSTRUCTIONS  Do not drive after drinking alcohol.  Stay hydrated. Drink enough water and fluids to keep your urine clear or pale yellow. Avoid caffeine.   Only take over-the-counter or prescription medicines as directed by your health care provider.  SEEK MEDICAL CARE IF:   You have persistent vomiting.   You do not feel better after a few days.  You have frequent alcohol intoxication. Your health care provider can help determine if you should see a substance use treatment counselor. SEEK IMMEDIATE MEDICAL CARE IF:   You become shaky or tremble when you try to stop drinking.   You shake uncontrollably (seizure).   You throw up (vomit) blood. This may be bright red or may look like black coffee grounds.   You have blood in your stool. This may be bright red or may appear as a black, tarry, bad smelling stool.   You become lightheaded or faint.  MAKE SURE YOU:   Understand these instructions.  Will watch your condition.  Will get help right away if you are not doing well or get worse. Document Released: 03/18/2005 Document Revised: 02/08/2013 Document Reviewed: 11/11/2012 [facility] Patient Information 2015 [place], [place]. This information is not intended to replace advice given to you by your health care provider. Make sure you discuss any questions you have with your health  care provider.  

## 2014-05-13 NOTE — ED Notes (Signed)
Resting quietly with eye closed. Easily arousable. Verbally responsive. Resp even and unlabored. ABC's intact. NAD noted.  

## 2015-05-22 ENCOUNTER — Emergency Department (HOSPITAL_COMMUNITY): Payer: No Typology Code available for payment source

## 2015-05-22 ENCOUNTER — Emergency Department (HOSPITAL_COMMUNITY)
Admission: EM | Admit: 2015-05-22 | Discharge: 2015-05-22 | Disposition: A | Discharge status: Home / Self Care | Payer: No Typology Code available for payment source | Attending: Emergency Medicine | Admitting: Emergency Medicine

## 2015-05-22 ENCOUNTER — Encounter (HOSPITAL_COMMUNITY): Payer: Self-pay

## 2015-05-22 DIAGNOSIS — S0990XA Unspecified injury of head, initial encounter: Secondary | ICD-10-CM

## 2015-05-22 DIAGNOSIS — S298XXA Other specified injuries of thorax, initial encounter: Secondary | ICD-10-CM

## 2015-05-22 DIAGNOSIS — J45909 Unspecified asthma, uncomplicated: Secondary | ICD-10-CM | POA: Insufficient documentation

## 2015-05-22 DIAGNOSIS — Z7982 Long term (current) use of aspirin: Secondary | ICD-10-CM | POA: Insufficient documentation

## 2015-05-22 DIAGNOSIS — W1839XA Other fall on same level, initial encounter: Secondary | ICD-10-CM | POA: Insufficient documentation

## 2015-05-22 DIAGNOSIS — S134XXA Sprain of ligaments of cervical spine, initial encounter: Secondary | ICD-10-CM | POA: Insufficient documentation

## 2015-05-22 DIAGNOSIS — Y9289 Other specified places as the place of occurrence of the external cause: Secondary | ICD-10-CM | POA: Insufficient documentation

## 2015-05-22 DIAGNOSIS — F1310 Sedative, hypnotic or anxiolytic abuse, uncomplicated: Secondary | ICD-10-CM | POA: Insufficient documentation

## 2015-05-22 DIAGNOSIS — S29001A Unspecified injury of muscle and tendon of front wall of thorax, initial encounter: Secondary | ICD-10-CM | POA: Insufficient documentation

## 2015-05-22 DIAGNOSIS — Z23 Encounter for immunization: Secondary | ICD-10-CM | POA: Insufficient documentation

## 2015-05-22 DIAGNOSIS — S0001XA Abrasion of scalp, initial encounter: Secondary | ICD-10-CM | POA: Insufficient documentation

## 2015-05-22 DIAGNOSIS — R631 Polydipsia: Secondary | ICD-10-CM | POA: Insufficient documentation

## 2015-05-22 DIAGNOSIS — Y9389 Activity, other specified: Secondary | ICD-10-CM | POA: Insufficient documentation

## 2015-05-22 DIAGNOSIS — Y998 Other external cause status: Secondary | ICD-10-CM | POA: Insufficient documentation

## 2015-05-22 DIAGNOSIS — S139XXA Sprain of joints and ligaments of unspecified parts of neck, initial encounter: Secondary | ICD-10-CM

## 2015-05-22 DIAGNOSIS — F10120 Alcohol abuse with intoxication, uncomplicated: Secondary | ICD-10-CM | POA: Insufficient documentation

## 2015-05-22 DIAGNOSIS — I10 Essential (primary) hypertension: Secondary | ICD-10-CM | POA: Insufficient documentation

## 2015-05-22 DIAGNOSIS — F10929 Alcohol use, unspecified with intoxication, unspecified: Secondary | ICD-10-CM

## 2015-05-22 HISTORY — DX: Unspecified asthma, uncomplicated: J45.909

## 2015-05-22 LAB — COMPREHENSIVE METABOLIC PANEL
ALBUMIN: 4.4 g/dL (ref 3.5–5.0)
ALT: 25 U/L (ref 17–63)
ANION GAP: 10 (ref 5–15)
AST: 22 U/L (ref 15–41)
Alkaline Phosphatase: 62 U/L (ref 38–126)
BILIRUBIN TOTAL: 0.5 mg/dL (ref 0.3–1.2)
BUN: 11 mg/dL (ref 6–20)
CALCIUM: 8.9 mg/dL (ref 8.9–10.3)
CO2: 23 mmol/L (ref 22–32)
Chloride: 102 mmol/L (ref 101–111)
Creatinine, Ser: 0.83 mg/dL (ref 0.61–1.24)
GFR calc non Af Amer: >60 mL/min (ref >60)
GLUCOSE: 100 mg/dL — AB (ref 65–99)
POTASSIUM: 3.8 mmol/L (ref 3.5–5.1)
SODIUM: 135 mmol/L (ref 135–145)
TOTAL PROTEIN: 7.7 g/dL (ref 6.5–8.1)

## 2015-05-22 LAB — CBC WITH DIFFERENTIAL/PLATELET
BASOS PCT: 0 %
Basophils Absolute: 0 10*3/uL (ref 0.0–0.1)
EOS ABS: 0.1 10*3/uL (ref 0.0–0.7)
Eosinophils Relative: 2 %
HCT: 44.8 % (ref 39.0–52.0)
Hemoglobin: 16.2 g/dL (ref 13.0–17.0)
LYMPHS ABS: 2.3 10*3/uL (ref 0.7–4.0)
Lymphocytes Relative: 31 %
MCH: 33.9 pg (ref 26.0–34.0)
MCHC: 36.2 g/dL — ABNORMAL HIGH (ref 30.0–36.0)
MCV: 93.7 fL (ref 78.0–100.0)
MONO ABS: 0.6 10*3/uL (ref 0.1–1.0)
MONOS PCT: 9 %
Neutro Abs: 4.2 10*3/uL (ref 1.7–7.7)
Neutrophils Relative %: 58 %
Platelets: 163 10*3/uL (ref 150–400)
RBC: 4.78 MIL/uL (ref 4.22–5.81)
RDW: 11.6 % (ref 11.5–15.5)
WBC: 7.2 10*3/uL (ref 4.0–10.5)

## 2015-05-22 LAB — ETHANOL: ALCOHOL ETHYL (B): 322 mg/dL — AB (ref <5)

## 2015-05-22 LAB — RAPID URINE DRUG SCREEN, HOSP PERFORMED
AMPHETAMINES: NOT DETECTED
Barbiturates: NOT DETECTED
Benzodiazepines: POSITIVE — AB
COCAINE: NOT DETECTED
OPIATES: NOT DETECTED
Tetrahydrocannabinol: NOT DETECTED

## 2015-05-22 MED ORDER — TETANUS-DIPHTH-ACELL PERTUSSIS 5-2.5-18.5 LF-MCG/0.5 IM SUSP
0.5000 mL | Freq: Once | INTRAMUSCULAR | Status: AC
  Administered 2015-05-22: 0.5 mL via INTRAMUSCULAR
  Filled 2015-05-22: qty 0.5

## 2015-05-22 NOTE — Discharge Instructions (Signed)
°Emergency Department Resource Guide °1) Find a Doctor and Pay Out of Pocket °Although you won't have to find out who is covered by your insurance plan, it is a good idea to ask around and get recommendations. You will then need to call the office and see if the doctor you have chosen will accept you as a new patient and what types of options they offer for patients who are self-pay. Some doctors offer discounts or will set up payment plans for their patients who do not have insurance, but you will need to ask so you aren't surprised when you get to your appointment. ° °2) Contact Your Local Health Department °Not all health departments have doctors that can see patients for sick visits, but many do, so it is worth a call to see if yours does. If you don't know where your local health department is, you can check in your phone book. The CDC also has a tool to help you locate your state's health department, and many state websites also have listings of all of their local health departments. ° °3) Find a Walk-in Clinic °If your illness is not likely to be very severe or complicated, you may want to try a walk in clinic. These are popping up all over the country in pharmacies, drugstores, and shopping centers. They're usually staffed by nurse practitioners or physician assistants that have been trained to treat common illnesses and complaints. They're usually fairly quick and inexpensive. However, if you have serious medical issues or chronic medical problems, these are probably not your best option. ° °No Primary Care Doctor: °- Call [facility] at  832-8000 - they can help you locate a primary care doctor that  accepts your insurance, provides certain services, etc. °- Physician Referral Service- 1-800-533-3463 ° °Chronic Pain Problems: °Organization         Address  Phone   Notes  °[facility]  (336) 297-2271 Patients need to be referred by their primary care doctor.  ° °Medication  Assistance: °Organization         Address  Phone   Notes  °[place] [facility] [address] °[place], [place] [address] (336) 641-8030 --Must be a resident of [place] °-- Must have NO insurance coverage whatsoever (no Medicaid/ Medicare, etc.) °-- The pt. MUST have a primary care doctor that directs their care regularly and follows them in the community °  °[facility]  (866) 331-1348   °[employer]  (888) 892-1162   ° °Agencies that provide inexpensive medical care: °Organization         Address  Phone   Notes  °[facility]  (336) 832-8035   °[facility]    (336) 832-7272   °[facility] [address] °[place], [place] [address] (336) 832-4777   °[facility] [address], °[place] (336) 271-4999   °[facility]    (336) 373-0678   °[facility]    (336) 272-1050   °[facility] ° [address], [place] Phone:  (336) 832-4444, Fax:  (336) 832-4440 Hours of Operation:  9 am - 6 pm, M-F.  Also accepts Medicaid/Medicare and self-pay.  °[facility] ° [address], [place] Phone: (336) [phone], Fax: (336) [phone]. Hours of Operation:  8:30 am - 5:30 pm, M-F.  Also accepts Medicaid and self-pay.  °[facility] [place] [address], [place] Phone: (336) 878-6027   °[facility] [address], [place], [place] (336)723-1848, Ext. 123 Mondays & Thursdays: 7-9 AM.  First 15 patients are seen on a first come, first serve basis. °  ° °Medicaid-accepting [place] Providers: ° °Organization         Address  Phone   Notes  °[facility] [address], [place] (336) 641-2100 Also accepts self-pay patients.  °[facility] [address], [place] ° (336) 856-9996   °[facility] [address], [place]  (336) 288-8857   °[facility] [address] [place] [address], [place] (336) 299-7000   °Veita Bland [address], [place]  ° (336) 373-1557 Only accepts [place] Access Medicaid patients after they have their name applied to their card.  ° °Self-Pay (no insurance) in [place]: ° °Organization         Address  Phone   Notes  °Sickle Cell Patients, [facility] [address], [place] (336) 832-1970   °[facility] [address], [place] (336) 832-4400   °[facility] ° [address], [place] (336) 992-4800   °[facility]/Dr. Osei-Bonsu ° [address] [place] [address], [place] or [address], [place] (336) [phone] Phone number for both [place] and [place] locations is the same.  °[facility] [address], [place] (336) 299-0000   °[facility] [address] [place] [address], [place] or [address] (336) 852-7530 °(336) 878-2260   °[facility] [address], [place] (336) 350-1642, phone; (336) 294-5005, fax Sees patients 1st and 3rd Saturday of every month.  Must not qualify for public or private insurance (i.e. Medicaid, Medicare, [place] Health Choice, Veterans' Benefits) • Household income should be no more than 200% of the poverty level •The clinic cannot treat you if you are pregnant or think you are pregnant • Sexually transmitted diseases are not treated at the clinic.  ° ° °Dental Care: °Organization         Address  Phone  Notes  °[place] [facility] [address], [place] (336) 641-6152 Accepts children up to age 21 who are enrolled in Medicaid or [place] Health Choice; pregnant women with a Medicaid card; and children who have applied for Medicaid or [place] Health Choice, but were declined, whose parents can pay a reduced fee at time of service.  °[place]  [facility] [place]  [address], [place] (336) 641-7733 Accepts children up to age 21 who are enrolled in Medicaid or [place] Health Choice; pregnant women with a Medicaid card; and children who have applied for Medicaid or [place] Health Choice, but were declined, whose parents can pay a reduced fee at time of service.  °[facility] ° [address], [place] (336) 641-4533 Patients are seen by appointment only. Walk-ins are not accepted. [facility] will see patients 18 years of age and older. °Monday - Tuesday (8am-5pm) °Most Wednesdays (8:30-5pm) °$30 per visit, cash only  °[facility] ° [address], [place] (336) 641-4533 Patients are seen by appointment only. Walk-ins are not accepted. [facility] will see patients 18 years of age and older. °One   Wednesday Evening (Monthly: Volunteer Based).  $30 per visit, cash only  °[facility]  (919) 537-3737 for adults; Children under age 4, call Graduate Pediatric Dentistry at ([phone]) [phone]. Children aged 4-14, please call ([phone]) [phone] to request a pediatric application. ° Dental services are provided in all areas of dental care including fillings, crowns and bridges, complete and partial dentures, implants, gum treatment, root canals, and extractions. Preventive care is also provided. Treatment is provided to both adults and children. °Patients are selected via a lottery and there is often a waiting list. °  °[facility] [address], °[place] ° (336) 763-8833 [email or web address] °  °[facility] [address], [place], [place] (336)723-1848, Ext. 123 Second and Fourth Thursday of each month, opens at 6:30 AM; Clinic ends at 9 AM.  Patients are seen on a first-come first-served basis, and a limited number are seen during each clinic.  ° °[facility] ° [address], [place], [place] (336) [phone]    Eligibility Requirements °You must have lived in [place], [place], or [place] counties for at least the last three months. °  You cannot be eligible for state or federal sponsored healthcare insurance, including Veterans Administration, Medicaid, or Medicare. °  You generally cannot be eligible for healthcare insurance through your employer.  °  How to apply: °Eligibility screenings are held every Tuesday and Wednesday afternoon from 1:00 pm until 4:00 pm. You do not need an appointment for the interview!  °[facility] [address], [place], [place] 336-631-2330   °[facility]  336-342-8273   °[facility]  336-703-3100   °[facility]  336-570-6415   ° °Behavioral Health Resources in the Community: °Intensive Outpatient Programs °Organization         Address  Phone  Notes  °[facility] [address], [place], [place] 336-878-6098   °[facility] [address], [place], [place] 336-832-9800   °[facility] [address], [place], [place] ° 336-882-2125   °[facility] [address],  °[place], [place] 1-800-853-5163 or 336-641-4981   °Substance Abuse Resources °Organization         Address  Phone  Notes  °[facility]  336-882-2125   °[facility]  336-784-9470   °[facility]  336-285-9073   °[facility]  336-845-3988   °Residential & Outpatient Substance Abuse Program  1-800-659-3381   °Psychological Services °Organization         Address  Phone  Notes  °[facility]  336- 832-9600   °[facility]  336- 378-7881   °[facility] [address], [place] 1-800-853-5163 or 336-641-4981   ° °Mobile Crisis Teams °Organization         Address  Phone  Notes  °[facility], Mobile Crisis Care Unit  1-877-626-1772   °Assertive °[facility] ° [address]  [place], [place] 336-834-9664   °Sharon DeEsch [address] °[place] 336-554-5454   ° °Self-Help/Support Groups °Organization         Address  Phone             Notes  °[facility]  - variety of support groups  336- 373-1402 Call for more information  °Narcotics Anonymous (NA), [facility] [address], °[place]  2 meetings at this location  ° °  Residential Treatment Programs Organization         Address  Phone  Notes  [facility] [address],    [place]  1-610-960-4540   [facility]  [address], [place] [address], [place], [place] 478-295-6213   [facility] [address] [place], [place] 086-578-4696 Admissions: 8am-3pm M-F  [facility] [address],    [place], [place] 295-284-1324   [facility] [address] [place], [place], [place] 401-027-2536   [facility] [address],  [place], [place] 644-034-7425   [facility] Intensive Outpatient [address], [place], [place] 956-387-5643   [facility] ([facility] [address] [place]  [place], [place] 3-295-188-4166 or (820)777-2753   [facility] ([facility] [address], [place], [place] 323-557-3220 Accepts Medicaid  [facility] [address],  [place] 2-542-706-2376 Substance Abuse/Addiction Treatment   [facility] Organization         Address  Phone  Notes  [facility]  845-397-4938   Angie Fava, PhD [address] [place], [place]   (419)383-3927 or [phone]   [facility]   [address] [place], [place] 415-333-6950   [facility] [address] [place], [place] 949-591-6086 Insurance/Medicaid/sponsorship through [facility] [address]                                    [place], [place] (534) [phone] Therapy/tele-psych/case    [facility] [address], [place] (972)184-5443    Dr. Lolly Mustache  [phone]   [facility]  [employer] [facility] Dept. 1) [address] [place] 2) [address] [place] 3)  [address], [place] (435)[phone] ([phone]  3656361065   [facility] Child Abuse Hotline 865-631-8376 or 629-111-9423 (After Hours)      You have had a head injury which does not appear to require admission at this time. A concussion is a state of changed mental ability from trauma.  SEEK IMMEDIATE MEDICAL ATTENTION IF: There is confusion or drowsiness (although children frequently become drowsy after injury).  You cannot awaken the injured person.  There is nausea (feeling sick to your stomach) or continued, forceful vomiting.  You notice dizziness or unsteadiness which is getting worse, or inability to walk.  You have convulsions or unconsciousness.  You experience severe, persistent headaches not relieved by Tylenol. (Do not take aspirin as this impairs clotting abilities). Take other pain medications only as directed.  You cannot use arms or legs normally.  There are changes in pupil sizes. (This is the black center in the colored part of the eye)  There is clear or bloody discharge from the nose or ears.  Change in speech, vision, swallowing, or understanding.  Localized weakness, numbness, tingling, or change in bowel or bladder control.    Alcohol Intoxication Alcohol intoxication occurs when you drink enough alcohol that it affects your ability to function. It can be mild or very severe. Drinking a lot of alcohol in a short time is called binge drinking. This can be very harmful. Drinking alcohol can also be more dangerous if you are taking medicines or other drugs. Some of  the effects caused by alcohol may include:  Loss of coordination.  Changes in mood and behavior.  Unclear thinking.  Trouble talking (slurred  speech).  Throwing up (vomiting).  Confusion.  Slowed breathing.  Twitching and shaking (seizures).  Loss of consciousness. HOME CARE  Do not drive after drinking alcohol.  Drink enough water and fluids to keep your pee (urine) clear or pale yellow. Avoid caffeine.  Only take medicine as told by your doctor. GET HELP IF:  You throw up (vomit) many times.  You do not feel better after a few days.  You frequently have alcohol intoxication. Your doctor can help decide if you should see a substance use treatment counselor. GET HELP RIGHT AWAY IF:  You become shaky when you stop drinking.  You have twitching and shaking.  You throw up blood. It may look bright red or like coffee grounds.  You notice blood in your poop (bowel movements).  You become lightheaded or pass out (faint). MAKE SURE YOU:   Understand these instructions.  Will watch your condition.  Will get help right away if you are not doing well or get worse.   This information is not intended to replace advice given to you by your health care provider. Make sure you discuss any questions you have with your health care provider.   Document Released: 11/25/2007 Document Revised: 02/08/2013 Document Reviewed: 11/11/2012 Elsevier Interactive Patient Education [employer].

## 2015-05-22 NOTE — ED Notes (Signed)
MD at bedside. EDP WICKLINE PRESENT  

## 2015-05-22 NOTE — ED Notes (Signed)
Daughter at bedside, wanting to speak with MD. MD made aware.

## 2015-05-22 NOTE — ED Notes (Signed)
etoh [phone]

## 2015-05-22 NOTE — ED Notes (Signed)
Per EMS-intoxicated-drinking all day, took 3 .5mg  Xanax-fell and has small laceration to back of head-bandage placed-C-collar placed-no LOC-his friend called EMS-

## 2015-05-22 NOTE — ED Provider Notes (Signed)
CSN: [id number]     Arrival date & time 05/22/15  1835 History   First MD Initiated Contact with Patient 05/22/15 1853     Chief Complaint  Patient presents with  . fall/ETOH    LEVEL 5 CAVEAT DUE ALCOHOL INTOXICATION   Patient is a 54 y.o. male presenting with intoxication. The history is provided by the patient. The history is limited by the condition of the patient.  Alcohol Intoxication This is a new problem. Episode onset: UNKNOWN. The problem occurs constantly. The problem has been gradually worsening. Nothing aggravates the symptoms. Nothing relieves the symptoms.  pt presents for alcohol intoxication and also fall Few details are known, but pt reports drinking 3 40oz bottles of beer as well as taking 3 xanax He also fell at some point but is unsure when he fell He also thinks he may have injured his chest in the fall He also reports feeling depressed but no SI  No other details are known  Past Medical History  Diagnosis Date  . Hypertension   . ETOH abuse   . Asthma    History reviewed. No pertinent past surgical history. No family history on file. Social History  Substance Use Topics  . Smoking status: Never Smoker   . Smokeless tobacco: Never Used  . Alcohol Use: Yes    Review of Systems  Unable to perform ROS: Mental status change  Endocrine: Positive for polydipsia.  Psychiatric/Behavioral: Negative for suicidal ideas.      Allergies  Shellfish allergy  Home Medications   Prior to Admission medications   Medication Sig Start Date End Date Taking? Authorizing Provider  aspirin EC 81 MG tablet Take 81 mg by mouth every morning.    Historical Provider, MD  hydrochlorothiazide (HYDRODIURIL) 25 MG tablet  04/01/14   Historical Provider, MD  ibuprofen (ADVIL,MOTRIN) 600 MG tablet Take 600 mg by mouth every 6 (six) hours as needed (pain).    Historical Provider, MD  lisinopril (PRINIVIL,ZESTRIL) 10 MG tablet  04/13/14   Historical Provider, MD  omeprazole  (PRILOSEC) 20 MG capsule  04/01/14   Historical Provider, MD   BP 150/111 mmHg  Pulse 88  Temp(Src) 97.7 F (36.5 C) (Oral)  Resp 18  SpO2 98% Physical Exam CONSTITUTIONAL: disheveled, smells of ETOH HEAD: contusion/abrasion to posterior scalp.  No laceration noted EYES: EOMI/PERRL ENMT: Mucous membranes moist NECK: c-collar in place SPINE/BACK:diffuse cspine tenderness.  No bruising/crepitance/stepoffs noted to spine CV: S1/S2 noted, no murmurs/rubs/gallops noted LUNGS: Lungs are clear to auscultation bilaterally, no apparent distress ABDOMEN: soft, nontender NEURO: Pt is awake, but he is intoxicated, he is slurring his words.  He has no focal weakness noted in his extremities  EXTREMITIES: pulses normal/equal, full ROM, no deformity or signs of trauma SKIN: warm, color normal PSYCH: anxious  ED Course  Procedures  7:55 PM Pt clearly intoxicated CT imaging negative Will monitor and reassess 9:09 PM Imaging negative Pt awake/alert He is intoxicated but no other acute issues Stable for d/c home Family is here to take patient home  Labs Review Labs Reviewed  CBC WITH DIFFERENTIAL/PLATELET - Abnormal; Notable for the following:    MCHC 36.2 (*)    All other components within normal limits  COMPREHENSIVE METABOLIC PANEL - Abnormal; Notable for the following:    Glucose, Bld 100 (*)    All other components within normal limits  URINE RAPID DRUG SCREEN, HOSP PERFORMED - Abnormal; Notable for the following:    Benzodiazepines POSITIVE (*)  All other components within normal limits  ETHANOL - Abnormal; Notable for the following:    Alcohol, Ethyl (B) 322 (*)    All other components within normal limits    Imaging Review Dg Chest 2 View  05/22/2015  CLINICAL DATA:  Intoxicated, status post fall with head laceration. EXAM: CHEST  2 VIEW COMPARISON:  Chest x-ray dated 10/28/2013. FINDINGS: Study is hypoinspiratory with crowding of the perihilar and bibasilar  bronchovascular markings. Cardiomediastinal silhouette appears stable in size and configuration. Given the low lung volumes, lungs appear clear. No evidence of infiltrate or pleural effusion. No pneumothorax seen. There is an old healed fracture within the posterior left ninth rib. No acute - appearing osseous abnormality identified. IMPRESSION: Hypoinspiratory changes as detailed above.  No acute findings. Electronically Signed   By: Bary Richard M.D.   On: 05/22/2015 19:47   Ct Head Wo Contrast  05/22/2015  CLINICAL DATA:  Intoxicated, fall, laceration to back of head. History of hypertension. EXAM: CT HEAD WITHOUT CONTRAST CT CERVICAL SPINE WITHOUT CONTRAST TECHNIQUE: Multidetector CT imaging of the head and cervical spine was performed following the standard protocol without intravenous contrast. Multiplanar CT image reconstructions of the cervical spine were also generated. COMPARISON:  Head CT and cervical spine CT dated 05/12/2014. FINDINGS: CT HEAD FINDINGS There is mild generalized brain atrophy with commensurate dilatation of the ventricles and sulci. There is no mass, hemorrhage, edema, or other evidence of acute parenchymal abnormality. No extra-axial hemorrhage. There is a slightly displaced fracture of the left nasal bone, of uncertain age. There appears to be soft tissue edema/hematoma overlying the occipital bone vertex. No underlying fracture seen. CT CERVICAL SPINE FINDINGS Mild degenerative change noted within the lower cervical spine. Straightening of the normal cervical lordosis likely related to these underlying degenerative changes. No fracture line or displaced fracture fragment identified. Facet joints appear well aligned throughout. Atherosclerotic calcifications noted at each carotid bulb region. Paravertebral soft tissues otherwise unremarkable. IMPRESSION: 1. Probable soft tissue edema/hematoma overlying the occipital bone vertex. No underlying fracture seen. 2. Slightly displaced  fracture of the left nasal bone, of uncertain age but favored to be chronic. 3. No evidence of acute intracranial abnormality. No intracranial mass, hemorrhage, or edema. 4. No fracture or acute subluxation identified in the cervical spine. Mild degenerative changes of the cervical spine, stable compared to previous exams. Electronically Signed   By: Bary Richard M.D.   On: 05/22/2015 19:41   Ct Cervical Spine Wo Contrast  05/22/2015  CLINICAL DATA:  Intoxicated, fall, laceration to back of head. History of hypertension. EXAM: CT HEAD WITHOUT CONTRAST CT CERVICAL SPINE WITHOUT CONTRAST TECHNIQUE: Multidetector CT imaging of the head and cervical spine was performed following the standard protocol without intravenous contrast. Multiplanar CT image reconstructions of the cervical spine were also generated. COMPARISON:  Head CT and cervical spine CT dated 05/12/2014. FINDINGS: CT HEAD FINDINGS There is mild generalized brain atrophy with commensurate dilatation of the ventricles and sulci. There is no mass, hemorrhage, edema, or other evidence of acute parenchymal abnormality. No extra-axial hemorrhage. There is a slightly displaced fracture of the left nasal bone, of uncertain age. There appears to be soft tissue edema/hematoma overlying the occipital bone vertex. No underlying fracture seen. CT CERVICAL SPINE FINDINGS Mild degenerative change noted within the lower cervical spine. Straightening of the normal cervical lordosis likely related to these underlying degenerative changes. No fracture line or displaced fracture fragment identified. Facet joints appear well aligned throughout. Atherosclerotic calcifications noted at  each carotid bulb region. Paravertebral soft tissues otherwise unremarkable. IMPRESSION: 1. Probable soft tissue edema/hematoma overlying the occipital bone vertex. No underlying fracture seen. 2. Slightly displaced fracture of the left nasal bone, of uncertain age but favored to be chronic.  3. No evidence of acute intracranial abnormality. No intracranial mass, hemorrhage, or edema. 4. No fracture or acute subluxation identified in the cervical spine. Mild degenerative changes of the cervical spine, stable compared to previous exams. Electronically Signed   By: Bary Richard M.D.   On: 05/22/2015 19:41   I have personally reviewed and evaluated these images and lab results as part of my medical decision-making.   EKG Interpretation   Date/Time:  Wednesday May 22 2015 19:09:29 EST Ventricular Rate:  91 PR Interval:  130 QRS Duration: 94 QT Interval:  314 QTC Calculation: 386 R Axis:   69 Text Interpretation:  Sinus rhythm Borderline repolarization abnormality  Baseline wander in lead(s) V4 No significant change since last tracing  Confirmed by Bebe Shaggy  MD, Dorinda Hill ([id number]) on 05/22/2015 7:51:06 PM     Medications  Tdap (BOOSTRIX) injection 0.5 mL (0.5 mLs Intramuscular Given 05/22/15 2033)    MDM   Final diagnoses:  Alcohol intoxication, with unspecified complication (HCC)  Scalp abrasion, initial encounter  Blunt chest trauma, initial encounter  Acute cervical sprain, initial encounter  Minor head injury, initial encounter    Nursing notes including past medical history and social history reviewed and considered in documentation Labs/vital reviewed myself and considered during evaluation xrays/imaging reviewed by myself and considered during evaluation     Zadie Rhine, MD 05/22/15 2110

## 2015-05-22 NOTE — ED Notes (Signed)
Bed: [id number] Expected date:  Expected time:  Means of arrival:  Comments: etoh fall

## 2015-08-09 ENCOUNTER — Encounter (HOSPITAL_COMMUNITY): Payer: Self-pay | Admitting: Emergency Medicine

## 2015-08-09 ENCOUNTER — Emergency Department (HOSPITAL_COMMUNITY)
Admission: EM | Admit: 2015-08-09 | Discharge: 2015-08-10 | Disposition: A | Discharge status: Home / Self Care | Payer: No Typology Code available for payment source | Attending: Emergency Medicine | Admitting: Emergency Medicine

## 2015-08-09 DIAGNOSIS — F10120 Alcohol abuse with intoxication, uncomplicated: Secondary | ICD-10-CM | POA: Insufficient documentation

## 2015-08-09 DIAGNOSIS — F10920 Alcohol use, unspecified with intoxication, uncomplicated: Secondary | ICD-10-CM

## 2015-08-09 DIAGNOSIS — R109 Unspecified abdominal pain: Secondary | ICD-10-CM | POA: Insufficient documentation

## 2015-08-09 DIAGNOSIS — J45909 Unspecified asthma, uncomplicated: Secondary | ICD-10-CM | POA: Insufficient documentation

## 2015-08-09 DIAGNOSIS — I10 Essential (primary) hypertension: Secondary | ICD-10-CM | POA: Insufficient documentation

## 2015-08-09 LAB — CBC WITH DIFFERENTIAL/PLATELET
Basophils Absolute: 0 10*3/uL (ref 0.0–0.1)
Basophils Relative: 0 %
Eosinophils Absolute: 0.1 10*3/uL (ref 0.0–0.7)
Eosinophils Relative: 1 %
HCT: 44.3 % (ref 39.0–52.0)
Hemoglobin: 15.2 g/dL (ref 13.0–17.0)
Lymphocytes Relative: 28 %
Lymphs Abs: 1.9 10*3/uL (ref 0.7–4.0)
MCH: 32.6 pg (ref 26.0–34.0)
MCHC: 34.3 g/dL (ref 30.0–36.0)
MCV: 95.1 fL (ref 78.0–100.0)
Monocytes Absolute: 0.4 10*3/uL (ref 0.1–1.0)
Monocytes Relative: 6 %
Neutro Abs: 4.4 10*3/uL (ref 1.7–7.7)
Neutrophils Relative %: 65 %
Platelets: 166 10*3/uL (ref 150–400)
RBC: 4.66 MIL/uL (ref 4.22–5.81)
RDW: 12.2 % (ref 11.5–15.5)
WBC: 6.8 10*3/uL (ref 4.0–10.5)

## 2015-08-09 LAB — URINALYSIS, ROUTINE W REFLEX MICROSCOPIC
Bilirubin Urine: NEGATIVE
Glucose, UA: NEGATIVE mg/dL
Ketones, ur: NEGATIVE mg/dL
Leukocytes, UA: NEGATIVE
Nitrite: NEGATIVE
Protein, ur: NEGATIVE mg/dL
Specific Gravity, Urine: 1.004 — ABNORMAL LOW (ref 1.005–1.030)
pH: 6 (ref 5.0–8.0)

## 2015-08-09 LAB — URINE MICROSCOPIC-ADD ON
Bacteria, UA: NONE SEEN
RBC / HPF: NONE SEEN RBC/hpf (ref 0–5)
Squamous Epithelial / HPF: NONE SEEN
WBC, UA: NONE SEEN WBC/hpf (ref 0–5)

## 2015-08-09 LAB — COMPREHENSIVE METABOLIC PANEL WITH GFR
ALT: 37 U/L (ref 17–63)
AST: 36 U/L (ref 15–41)
Albumin: 4.4 g/dL (ref 3.5–5.0)
Alkaline Phosphatase: 61 U/L (ref 38–126)
Anion gap: 14 (ref 5–15)
BUN: 9 mg/dL (ref 6–20)
CO2: 24 mmol/L (ref 22–32)
Calcium: 9.1 mg/dL (ref 8.9–10.3)
Chloride: 98 mmol/L — ABNORMAL LOW (ref 101–111)
Creatinine, Ser: 0.89 mg/dL (ref 0.61–1.24)
GFR calc Af Amer: >60 mL/min
GFR calc non Af Amer: >60 mL/min
Glucose, Bld: 127 mg/dL — ABNORMAL HIGH (ref 65–99)
Potassium: 3.9 mmol/L (ref 3.5–5.1)
Sodium: 136 mmol/L (ref 135–145)
Total Bilirubin: 0.7 mg/dL (ref 0.3–1.2)
Total Protein: 7.9 g/dL (ref 6.5–8.1)

## 2015-08-09 LAB — ETHANOL: Alcohol, Ethyl (B): 413 mg/dL (ref <5)

## 2015-08-09 LAB — LIPASE, BLOOD: Lipase: 30 U/L (ref 11–51)

## 2015-08-09 NOTE — ED Notes (Signed)
[facility] presents with a 55 yo male from home requesting alcohol detox.  Pt states "I need help."  Pt stated to [facility] that he drank 2 40 oz beers today and his appearance and behavior suggests he may have consumed more.  Pt speech is slurred and he repeats statements; however, pleasant in demeanor and behavior.  He stated that he had generalized abdominal pain and was nauseated earlier today but he stated he was not nauseated at this time.  Hx of asthma he stated to his recollection.

## 2015-08-09 NOTE — ED Notes (Signed)
Patient eating and drinking.

## 2015-08-09 NOTE — ED Notes (Signed)
Pt walked to restroom with steady gait

## 2015-08-09 NOTE — ED Notes (Signed)
Bed: [facility] Expected date:  Expected time:  Means of arrival:  Comments: ETOH 

## 2015-08-09 NOTE — ED Notes (Signed)
Patient ambulated to the restroom, gait is still some what unsteady.

## 2015-08-10 MED ORDER — CHLORDIAZEPOXIDE HCL 25 MG PO CAPS
50.0000 mg | ORAL_CAPSULE | Freq: Once | ORAL | Status: AC
  Administered 2015-08-10: 50 mg via ORAL
  Filled 2015-08-10: qty 2

## 2015-08-10 MED ORDER — VITAMIN B-1 100 MG PO TABS: 100.0000 mg | ORAL_TABLET | Freq: Every day | ORAL | Status: DC

## 2015-08-10 MED ORDER — CHLORDIAZEPOXIDE HCL 25 MG PO CAPS: ORAL_CAPSULE | ORAL | Status: DC

## 2015-08-10 MED ORDER — ADULT MULTIVITAMIN W/MINERALS CH
1.0000 | ORAL_TABLET | Freq: Every day | ORAL | Status: DC
  Filled 2015-08-10: qty 1

## 2015-08-10 MED ORDER — ONDANSETRON 4 MG PO TBDP: 4.0000 mg | ORAL_TABLET | Freq: Three times a day (TID) | ORAL | Status: DC | PRN

## 2015-08-10 MED ORDER — CHLORDIAZEPOXIDE HCL 25 MG PO CAPS: 25.0000 mg | ORAL_CAPSULE | Freq: Once | ORAL | Status: DC

## 2015-08-10 MED ORDER — THIAMINE HCL 100 MG/ML IJ SOLN
100.0000 mg | Freq: Once | INTRAMUSCULAR | Status: AC
  Administered 2015-08-10: 100 mg via INTRAMUSCULAR
  Filled 2015-08-10: qty 2

## 2015-08-10 MED ORDER — ONDANSETRON 4 MG PO TBDP
4.0000 mg | ORAL_TABLET | Freq: Once | ORAL | Status: AC
  Administered 2015-08-10: 4 mg via ORAL
  Filled 2015-08-10: qty 1

## 2015-08-10 MED ORDER — HYDROXYZINE HCL 25 MG PO TABS
25.0000 mg | ORAL_TABLET | ORAL | Status: AC
  Administered 2015-08-10: 25 mg via ORAL
  Filled 2015-08-10: qty 1

## 2015-08-10 MED ORDER — HYDROXYZINE HCL 25 MG PO TABS: 25.0000 mg | ORAL_TABLET | Freq: Three times a day (TID) | ORAL | Status: DC

## 2015-08-10 NOTE — ED Provider Notes (Signed)
CSN: [id number]     Arrival date & time 08/09/15  1841 History   First MD Initiated Contact with Patient 08/09/15 1850     Chief Complaint  Patient presents with  . Alcohol Intoxication  . Abdominal Pain     (Consider location/radiation/quality/duration/timing/severity/associated sxs/prior Treatment) HPI Patient presents to the emergency department with alcohol intoxication.  He states that he has some absent stomach.  The patient states that he needs help with his alcohol abuse.  Patient states that he is unsure of how much he drank today, but says only 2 40s.  Patient states that nothing seems make his condition worse.  Patient states that he does not have any chest pain, shortness of breath, nausea, vomiting, diarrhea, headache, blurred vision, weakness, dizziness, neck pain, dysuria, incontinence, or syncope Past Medical History  Diagnosis Date  . Hypertension   . ETOH abuse   . Asthma    History reviewed. No pertinent past surgical history. History reviewed. No pertinent family history. Social History  Substance Use Topics  . Smoking status: Never Smoker   . Smokeless tobacco: Never Used  . Alcohol Use: Yes    Review of Systems  All other systems negative except as documented in the HPI. All pertinent positives and negatives as reviewed in the HPI.  Allergies  Shellfish allergy  Home Medications   Prior to Admission medications   Not on File   BP 127/82 mmHg  Pulse 88  Temp(Src) 97.9 F (36.6 C) (Oral)  Resp 17  SpO2 97% Physical Exam  Constitutional: He is oriented to person, place, and time. He appears well-developed and well-nourished. No distress.  HENT:  Head: Normocephalic and atraumatic.  Mouth/Throat: Oropharynx is clear and moist.  Eyes: Pupils are equal, round, and reactive to light.  Cardiovascular: Normal rate, regular rhythm and normal heart sounds.  Exam reveals no gallop and no friction rub.   No murmur heard. Pulmonary/Chest: Effort normal  and breath sounds normal. No respiratory distress. He has no wheezes.  Abdominal: Soft. Bowel sounds are normal. He exhibits no distension. There is tenderness. There is no rebound and no guarding.  Musculoskeletal: He exhibits no edema.  Neurological: He is alert and oriented to person, place, and time. He exhibits normal muscle tone. Coordination normal.  Skin: Skin is warm and dry. No rash noted. No erythema.  Nursing note and vitals reviewed.   ED Course  Procedures (including critical care time) Labs Review Labs Reviewed  URINALYSIS, ROUTINE W REFLEX MICROSCOPIC (NOT AT [facility]) - Abnormal; Notable for the following:    Specific Gravity, Urine 1.004 (*)    Hgb urine dipstick TRACE (*)    All other components within normal limits  ETHANOL - Abnormal; Notable for the following:    Alcohol, Ethyl (B) 413 (*)    All other components within normal limits  COMPREHENSIVE METABOLIC PANEL - Abnormal; Notable for the following:    Chloride 98 (*)    Glucose, Bld 127 (*)    All other components within normal limits  LIPASE, BLOOD  CBC WITH DIFFERENTIAL/PLATELET  URINE MICROSCOPIC-ADD ON    Imaging Review No results found. I have personally reviewed and evaluated these images and lab results as part of my medical decision-making.   EKG Interpretation None     Patient is fairly intoxicated and is unable to stay awake to answer many questions, but does give a fairly decent history while awAke.  Patient does not have a way home and will need to stay  here until he is sober enough to be discharged.    Charlestine Night, PA-C 08/10/15 0011  Courteney Randall An, MD 08/11/15 2052

## 2015-08-10 NOTE — ED Notes (Signed)
Patient d/c'd self care.  F/U and medications reviewed.  Patient verbalized understanding. 

## 2015-08-10 NOTE — ED Provider Notes (Signed)
Eddie Torres August 23, 1960   Patient is a 55 year old male, presented to the ER with alcohol intoxication and abdominal pain. He was given to me at shift change, primary workup by Ebbie Ridge, PA-C.  Workup was pertinent for alcohol level of 413, remaining labs unremarkable.    Patient has been sleeping comfortably in the ER for the past several hours. He has been observed to ambulate without difficulty. He has eaten and drinking without difficulty.  His speech is steady without any slurring. He is without tremor or asterixis.  He is requesting medication for anxiety and states that he takes Ativan twice a day.  I have looked him up in the [place] controlled substance database and cannot find any record of prescription of benzos for this patient.  He was given Librium, thiamine, multivitamin, zofran and Vistaril for anxiety.  Patient states he'll be picked up by his daughter.  He was resource guide for alcohol dependence and detox.  Filed Vitals:   08/09/15 1911 08/09/15 2149 08/09/15 2154 08/10/15 0304  BP: 156/100 127/82 127/82 121/85  Pulse: 91 88 88 102  Temp: 97.9 F (36.6 C)  97.9 F (36.6 C)   TempSrc: Oral  Oral   Resp: SpO2: 97%  97% 92%   Medications  thiamine (B-1) injection 100 mg (not administered)  multivitamin with minerals tablet 1 tablet (not administered)  hydrOXYzine (ATARAX/VISTARIL) tablet 25 mg (not administered)  ondansetron (ZOFRAN-ODT) disintegrating tablet 4 mg (not administered)  chlordiazePOXIDE (LIBRIUM) capsule 50 mg (not administered)   Feel he is safe for discharge at this time.  He denies SI, HI and AVH.  Expresses desire to stop drinking alcohol.      Danelle Berry, PA-C 08/10/15 0454  April Palumbo, MD 08/10/15 [id number]

## 2015-08-10 NOTE — Discharge Instructions (Signed)
Alcohol Intoxication Alcohol intoxication occurs when the amount of alcohol that a person has consumed impairs his or her ability to mentally and physically function. Alcohol directly impairs the normal chemical activity of the brain. Drinking large amounts of alcohol can lead to changes in mental function and behavior, and it can cause many physical effects that can be harmful.  Alcohol intoxication can range in severity from mild to very severe. Various factors can affect the level of intoxication that occurs, such as the person's age, gender, weight, frequency of alcohol consumption, and the presence of other medical conditions (such as diabetes, seizures, or heart conditions). Dangerous levels of alcohol intoxication may occur when people drink large amounts of alcohol in a short period (binge drinking). Alcohol can also be especially dangerous when combined with certain prescription medicines or "recreational" drugs. SIGNS AND SYMPTOMS Some common signs and symptoms of mild alcohol intoxication include:  Loss of coordination.  Changes in mood and behavior.  Impaired judgment.  Slurred speech. As alcohol intoxication progresses to more severe levels, other signs and symptoms will appear. These may include:  Vomiting.  Confusion and impaired memory.  Slowed breathing.  Seizures.  Loss of consciousness. DIAGNOSIS  Your health care provider will take a medical history and perform a physical exam. You will be asked about the amount and type of alcohol you have consumed. Blood tests will be done to measure the concentration of alcohol in your blood. In many places, your blood alcohol level must be lower than 80 mg/dL (0.08%) to legally drive. However, many dangerous effects of alcohol can occur at much lower levels.  TREATMENT  People with alcohol intoxication often do not require treatment. Most of the effects of alcohol intoxication are temporary, and they go away as the alcohol naturally  leaves the body. Your health care provider will monitor your condition until you are stable enough to go home. Fluids are sometimes given through an IV access tube to help prevent dehydration.  HOME CARE INSTRUCTIONS  Do not drive after drinking alcohol.  Stay hydrated. Drink enough water and fluids to keep your urine clear or pale yellow. Avoid caffeine.   Only take over-the-counter or prescription medicines as directed by your health care provider.  SEEK MEDICAL CARE IF:   You have persistent vomiting.   You do not feel better after a few days.  You have frequent alcohol intoxication. Your health care provider can help determine if you should see a substance use treatment counselor. SEEK IMMEDIATE MEDICAL CARE IF:   You become shaky or tremble when you try to stop drinking.   You shake uncontrollably (seizure).   You throw up (vomit) blood. This may be bright red or may look like black coffee grounds.   You have blood in your stool. This may be bright red or may appear as a black, tarry, bad smelling stool.   You become lightheaded or faint.  MAKE SURE YOU:   Understand these instructions.  Will watch your condition.  Will get help right away if you are not doing well or get worse.   This information is not intended to replace advice given to you by your health care provider. Make sure you discuss any questions you have with your health care provider.   Document Released: 03/18/2005 Document Revised: 02/08/2013 Document Reviewed: 11/11/2012 Elsevier Interactive Patient Education 2016 [employer].  Alcohol Use Disorder Alcohol use disorder is a mental disorder. It is not a one-time incident of heavy drinking. Alcohol use  Document Released: 03/18/2005 Document Revised: 02/08/2013 Document Reviewed: 11/11/2012  Elsevier Interactive Patient Education ©2016 Elsevier Inc.    Alcohol Use Disorder  Alcohol use disorder is a mental disorder. It is not a one-time incident of heavy drinking. Alcohol use disorder is the excessive and uncontrollable use of alcohol over time that leads to problems with functioning in one or more areas of daily living. People with this disorder risk harming themselves and others when they drink to excess. Alcohol use  disorder also can cause other mental disorders, such as mood and anxiety disorders, and serious physical problems. People with alcohol use disorder often misuse other drugs.   Alcohol use disorder is common and widespread. Some people with this disorder drink alcohol to cope with or escape from negative life events. Others drink to relieve chronic pain or symptoms of mental illness. People with a family history of alcohol use disorder are at higher risk of losing control and using alcohol to excess.   Drinking too much alcohol can cause injury, accidents, and health problems. One drink can be too much when you are:  · Working.  · Pregnant or breastfeeding.  · Taking medicines. Ask your doctor.  · Driving or planning to drive.  SYMPTOMS   Signs and symptoms of alcohol use disorder may include the following:   · Consumption of alcohol in larger amounts or over a longer period of time than intended.  · Multiple unsuccessful attempts to cut down or control alcohol use.    · A great deal of time spent obtaining alcohol, using alcohol, or recovering from the effects of alcohol (hangover).  · A strong desire or urge to use alcohol (cravings).    · Continued use of alcohol despite problems at work, school, or home because of alcohol use.    · Continued use of alcohol despite problems in relationships because of alcohol use.  · Continued use of alcohol in situations when it is physically hazardous, such as driving a car.  · Continued use of alcohol despite awareness of a physical or psychological problem that is likely related to alcohol use. Physical problems related to alcohol use can involve the brain, heart, liver, stomach, and intestines. Psychological problems related to alcohol use include intoxication, depression, anxiety, psychosis, delirium, and dementia.    · The need for increased amounts of alcohol to achieve the same desired effect, or a decreased effect from the consumption of the same amount of alcohol  (tolerance).  · Withdrawal symptoms upon reducing or stopping alcohol use, or alcohol use to reduce or avoid withdrawal symptoms. Withdrawal symptoms include:  ¨ Racing heart.  ¨ Hand tremor.  ¨ Difficulty sleeping.  ¨ Nausea.  ¨ Vomiting.  ¨ Hallucinations.  ¨ Restlessness.  ¨ Seizures.  DIAGNOSIS  Alcohol use disorder is diagnosed through an assessment by your health care provider. Your health care provider may start by asking three or four questions to screen for excessive or problematic alcohol use. To confirm a diagnosis of alcohol use disorder, at least two symptoms must be present within a 12-month period. The severity of alcohol use disorder depends on the number of symptoms:  · Mild--two or three.  · Moderate--four or five.  · Severe--six or more.  Your health care provider may perform a physical exam or use results from lab tests to see if you have physical problems resulting from alcohol use. Your health care provider may refer you to a mental health professional for evaluation.  TREATMENT   Some people with alcohol use disorder are   withdrawal symptoms in the first week after quitting. This is important for people with a history of symptoms of withdrawal and for heavy drinkers who are likely to have withdrawal symptoms. Alcohol withdrawal can be dangerous and, in severe cases, cause death. Detoxification is usually provided in a hospital or in-patient substance use treatment facility.  Counseling or talk therapy.  Talk therapy is provided by substance use treatment counselors. It addresses the reasons people use alcohol and ways to keep them from drinking again. The goals of talk therapy are to help people with alcohol use disorder find healthy activities and ways to cope with life stress, to identify and avoid triggers for alcohol use, and to handle cravings, which can cause relapse.  Medicines.Different medicines can help treat alcohol use disorder through the following actions:  Decrease alcohol cravings.  Decrease the positive reward response felt from alcohol use.  Produce an uncomfortable physical reaction when alcohol is used (aversion therapy).  Support groups. Support groups are run by people who have quit drinking. They provide emotional support, advice, and guidance. These forms of treatment are often combined. Some people with alcohol use disorder benefit from intensive combination treatment provided by specialized substance use treatment centers. Both inpatient and outpatient treatment programs are available.   This information is not intended to replace advice given to you by your health care provider. Make sure you discuss any questions you have with your health care provider.   Document Released: 07/16/2004 Document Revised: 06/29/2014 Document Reviewed: 09/15/2012 Elsevier Interactive Patient Education 2016 [employer].    Emergency Department Resource Guide 1) Find a Doctor and Pay Out of Pocket Although you won't have to find out who is covered by your insurance plan, it is a good idea to ask around and get recommendations. You will then need to call the office and see if the doctor you have chosen will accept you as a new patient and what types of options they offer for patients who are self-pay. Some doctors offer discounts or will set up payment plans for their patients who do not have insurance, but you will need to ask so you aren't surprised when you get to your  appointment.  2) Contact Your Local Health Department Not all health departments have doctors that can see patients for sick visits, but many do, so it is worth a call to see if yours does. If you don't know where your local health department is, you can check in your phone book. The CDC also has a tool to help you locate your state's health department, and many state websites also have listings of all of their local health departments.  3) Find a Walk-in Clinic If your illness is not likely to be very severe or complicated, you may want to try a walk in clinic. These are popping up all over the country in pharmacies, drugstores, and shopping centers. They're usually staffed by nurse practitioners or physician assistants that have been trained to treat common illnesses and complaints. They're usually fairly quick and inexpensive. However, if you have serious medical issues or chronic medical problems, these are probably not your best option.  No Primary Care Doctor: - Call Health Connect at  [phone] - they can help you locate a primary care doctor that  accepts your insurance, provides certain services, etc. - Physician Referral Service- [phone]  Chronic Pain Problems: Organization         Address  Phone   Notes  [facility]  (  [phone] Patients need to be referred by their primary care doctor.   Medication Assistance: Organization         Address  Phone   Notes  [facility] Medication [facility] [address] [place] [place], [place] [address] [phone] --Must be a resident of [facility] -- Must have NO insurance coverage whatsoever (no Medicaid/ Medicare, etc.) -- The pt. MUST have a primary care doctor that directs their care regularly and follows them in the community   [facility]  [phone]   [employer]  [phone]    Agencies that provide inexpensive medical care: Organization         Address  Phone   Notes  [facility]  [phone]   [facility]    [phone]   [facility] [address] [place], [place] [address] [phone]   [facility] [address] [place] [place] [phone]   Planned Parenthood    [phone]   [facility]    [phone]   [facility] and [facility]  [address], [place] Phone:  [phone], Fax:  [phone] Hours of Operation:  9 am - 6 pm, M-F.  Also accepts Medicaid/Medicare and self-pay.  [facility]  [address], [place] Phone: [phone], Fax: [phone]. Hours of Operation:  8:30 am - 5:30 pm, M-F.  Also accepts Medicaid and self-pay.  [facility] [place] [address] [place] Phone: [phone]   [facility] [address] Natasha Bence [place], [place] [phone], Ext. [phone] Mondays & Thursdays: 7-9 AM.  First 15 patients are seen on a first come, first serve basis.    Medicaid-accepting [facility] Providers:  Organization         Address  Phone   Notes  [facility] [address],  [phone] Also accepts self-pay patients.  [facility] [address] Laurell Josephs [place], [place]  [phone]   [facility] [address], [place] [phone]   [facility] [address] [place] [phone]   Renaye Rakers [address], [place]   [phone] Only accepts [place] patients after they have their name applied to their card.   Self-Pay (no insurance) in [facility]:  Organization         Address  Phone   Notes  Sickle Cell Patients, [facility] [address] [place], [place] [phone]   [facility] [address] [place], [place] [phone]   [facility]  [place]  [address], [place] [phone]   [facility]/Dr. Osei-Bonsu  [address] [place] or [address], [place] [phone] Phone number for both [place] and [place] locations is the same.  [facility]gent [facility] and [facility] [address] [place] [phone]   [facility] [address] [place] or [address] [phone]   [facility] [address], [place] [phone], phone; [phone], fax Sees patients 1st and 3rd Saturday of every month.  Must not qualify for public or private insurance (i.e. Medicaid, Medicare, [place] Health Choice, Veterans' Benefits)  Household income should be no more than 200% of the poverty level The clinic cannot treat you if you are pregnant or think you are pregnant  Sexually transmitted diseases are not treated at the clinic.    Dental Care: Organization         Address  Phone  Notes  [facility] of [facility] [address] [place], [place] [phone] Accepts children up to age 46 who are enrolled in [place] or [place] Health Choice; pregnant women with a Medicaid card; and children who have applied for Medicaid or [place] Health Choice, but were declined, whose parents can pay a reduced fee at time of service.  [facility] Department of [facility]  [address], [place] [phone] Accepts children up to age 51 who are enrolled in [place] or [place] Health Choice; pregnant women with a Medicaid card; and children who have applied for Medicaid or [place] Health Choice, but were declined, whose parents can pay a reduced fee at time of service.  [facility]  [address] [place], [place] [phone] Patients are seen by appointment only. Walk-ins are not accepted. [facility] will see patients 29 years of age and  older. Monday - Tuesday (8am-5pm) Most Wednesdays (8:30-5pm) $30 per visit, cash only  [facility] Adult Dental Access [facility]  [address], [facility] [phone] Patients are seen by appointment only. Walk-ins are not accepted. [facility] will see patients 36 years of age and older. One Wednesday Evening (Monthly: Volunteer Based).  $30 per visit, cash only  [employer]  [phone] for adults; Children under age 64, call Graduate Pediatric Dentistry at [phone]. Children aged 79-14, please call [phone] to request a pediatric application.  Dental services are provided in all areas of dental care including fillings, crowns and bridges, complete and partial dentures, implants, gum treatment, root canals, and extractions. Preventive care is also provided. Treatment is provided to both adults and children. Patients are selected via a lottery and there is often a waiting list.   [facility] [address] [place]  [phone] [email or web address]   [facility] [address] [place], [place] [phone], Ext. [phone] Second and Fourth Thursday of each month, opens at 6:30 AM; Clinic ends at 9 AM.  Patients are seen on a first-come first-served basis, and a limited number are seen during each clinic.   [facility]  [address] Ether Griffins [place], [place] [phone]   Eligibility Requirements You must have lived in [place], [place], or [place] counties for at least the last three months.   You cannot be eligible for state or federal sponsored [place], including [employer], [place], or [employer].   You generally cannot be eligible for healthcare insurance through your employer.    How to apply: Eligibility screenings are held every Tuesday and Wednesday afternoon from 1:00 pm until 4:00 pm. You do not need an appointment for the interview!  [facility] [address]  [place], [place] [phone]   [facility]  [phone]   [facility]  [phone]   [facility]  [phone]    Behavioral Health Resources in the Community: Intensive Outpatient Programs Organization         Address  Phone  Notes  [facility] Services [address], [place],  [place] [phone]   [facility] Outpatient [address], [place], [place] [phone]   [facility] [address] [place], [place]  [phone]   [facility] Mental Health [address],  [place], [place] [phone] or [phone]   Substance Abuse Resources Organization         Address  Phone  Notes  [facility] Drug [facility]  [phone]   [facility]  [phone]   [facility] [place]  [phone]   Floydene Flock  [phone]   Residential & Outpatient Substance Abuse Program  [phone]   Psychological Services Organization         Address  Phone  Notes  [facility] Behavioral Health  [phone]   [facility]  [phone]   [facility] [address] [place] [phone] or [phone]    Mobile Crisis Teams Organization         Address  Phone  Notes  [facility], Mobile Crisis Care Unit  [phone]   [facility]  [address] [place], [place] [phone]   Doristine Locks [address] [place] [phone]    Self-Help/Support Groups Organization         Address  Phone             Notes  [facility] [place] - variety of support groups  336- I7437963 Call for more information  Narcotics Anonymous (NA), [facility] [address], [employer] [place]  2 meetings at this location   Statistician         Address  Phone  Notes  [facility] [address],    [place]  [phone]   [facility]  [address] [place] [address], [place], [place] [phone]   [facility] [address] [place], [place] [phone] Admissions: 8am-3pm M-F  [facility] [address]    [place], [place] [phone]   [facility] [address] [place], [place], [place] [phone]   [facility] [address]  [place], [place] [phone]   [facility] - Intensive Outpatient [address] Laurell Josephs [address], [place], [place] [phone]   [facility] ([facility]iction [facility] [address] [place].,  [place], [place] [phone] or [phone]   [facility] ([facility]) [address] [place], [place] [phone] Accepts Medicaid  [facility] [address]  [place] [phone] Substance Abuse/Addiction Treatment   [facility] Organization         Address  Phone  Notes  [facility]  [phone]   Angie Fava, PhD [address] Ervin [place], [place]   [phone] or [phone]   [facility]   [address] [place], [place] [phone]   [facility] [address] [place], [place] [phone] Insurance/Medicaid/sponsorship through [facility] and Families [address]                                    [place], [place] [phone] Therapy/tele-psych/case  [facility] [address], [place] [phone]    Dr. Lolly Mustache  [phone]   [facility]  [facility] Dept. 1) [address] [place] [address] [place] 2) [address] [facility]  [address], [place] °3)  [address], [place] [phone] °([phone] ° °([phone]) [phone]   °[place] Child Abuse Hotline (336) 342-1394 or (336) [phone] (After Hours)    ° ° ° °

## 2015-09-16 ENCOUNTER — Ambulatory Visit: Payer: Self-pay | Admitting: Family Medicine

## 2017-03-09 ENCOUNTER — Emergency Department (HOSPITAL_COMMUNITY): Payer: No Typology Code available for payment source

## 2017-03-09 ENCOUNTER — Encounter (HOSPITAL_COMMUNITY): Payer: Self-pay | Admitting: Emergency Medicine

## 2017-03-09 ENCOUNTER — Emergency Department (HOSPITAL_COMMUNITY)
Admission: EM | Admit: 2017-03-09 | Discharge: 2017-03-09 | Discharge status: Against Medical Advice | Payer: No Typology Code available for payment source | Attending: Emergency Medicine | Admitting: Emergency Medicine

## 2017-03-09 ENCOUNTER — Other Ambulatory Visit: Payer: Self-pay

## 2017-03-09 DIAGNOSIS — Z5321 Procedure and treatment not carried out due to patient leaving prior to being seen by health care provider: Secondary | ICD-10-CM | POA: Insufficient documentation

## 2017-03-09 LAB — CBC
HEMATOCRIT: 43.8 % (ref 39.0–52.0)
HEMOGLOBIN: 15.5 g/dL (ref 13.0–17.0)
MCH: 34.1 pg — ABNORMAL HIGH (ref 26.0–34.0)
MCHC: 35.4 g/dL (ref 30.0–36.0)
MCV: 96.5 fL (ref 78.0–100.0)
Platelets: 174 10*3/uL (ref 150–400)
RBC: 4.54 MIL/uL (ref 4.22–5.81)
RDW: 12.3 % (ref 11.5–15.5)
WBC: 6.7 10*3/uL (ref 4.0–10.5)

## 2017-03-09 LAB — COMPREHENSIVE METABOLIC PANEL
ALT: 26 U/L (ref 17–63)
AST: 28 U/L (ref 15–41)
Albumin: 4.4 g/dL (ref 3.5–5.0)
Alkaline Phosphatase: 84 U/L (ref 38–126)
Anion gap: 13 (ref 5–15)
BILIRUBIN TOTAL: 0.9 mg/dL (ref 0.3–1.2)
BUN: 7 mg/dL (ref 6–20)
CO2: 24 mmol/L (ref 22–32)
Calcium: 9 mg/dL (ref 8.9–10.3)
Chloride: 106 mmol/L (ref 101–111)
Creatinine, Ser: 0.78 mg/dL (ref 0.61–1.24)
GFR calc Af Amer: >60 mL/min (ref >60)
GLUCOSE: 104 mg/dL — AB (ref 65–99)
Potassium: 3.1 mmol/L — ABNORMAL LOW (ref 3.5–5.1)
Sodium: 143 mmol/L (ref 135–145)
TOTAL PROTEIN: 7.4 g/dL (ref 6.5–8.1)

## 2017-03-09 LAB — SALICYLATE LEVEL: Salicylate Lvl: <7 mg/dL (ref 2.8–30.0)

## 2017-03-09 LAB — RAPID URINE DRUG SCREEN, HOSP PERFORMED
AMPHETAMINES: NOT DETECTED
BARBITURATES: NOT DETECTED
Benzodiazepines: NOT DETECTED
COCAINE: NOT DETECTED
Opiates: NOT DETECTED
TETRAHYDROCANNABINOL: NOT DETECTED

## 2017-03-09 LAB — POCT I-STAT TROPONIN I: Troponin i, poc: 0.01 ng/mL (ref 0.00–0.08)

## 2017-03-09 LAB — ETHANOL: ALCOHOL ETHYL (B): 246 mg/dL — AB (ref <5)

## 2017-03-09 LAB — ACETAMINOPHEN LEVEL: Acetaminophen (Tylenol), Serum: <10 ug/mL — ABNORMAL LOW (ref 10–30)

## 2017-03-09 NOTE — ED Notes (Signed)
Patient adds that he is having chest pain with his anxiety. Pt states he has also felt light headed on and off for past couple months.
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
## 2017-03-09 NOTE — ED Notes (Signed)
Called for vitals no response

## 2017-05-08 ENCOUNTER — Emergency Department (HOSPITAL_COMMUNITY): Payer: No Typology Code available for payment source

## 2017-05-08 ENCOUNTER — Emergency Department (HOSPITAL_COMMUNITY)
Admission: EM | Admit: 2017-05-08 | Discharge: 2017-05-08 | Disposition: A | Discharge status: Home / Self Care | Payer: No Typology Code available for payment source | Attending: Emergency Medicine | Admitting: Emergency Medicine

## 2017-05-08 DIAGNOSIS — J45909 Unspecified asthma, uncomplicated: Secondary | ICD-10-CM | POA: Insufficient documentation

## 2017-05-08 DIAGNOSIS — Y999 Unspecified external cause status: Secondary | ICD-10-CM | POA: Insufficient documentation

## 2017-05-08 DIAGNOSIS — Y929 Unspecified place or not applicable: Secondary | ICD-10-CM | POA: Insufficient documentation

## 2017-05-08 DIAGNOSIS — Y9389 Activity, other specified: Secondary | ICD-10-CM | POA: Insufficient documentation

## 2017-05-08 DIAGNOSIS — S0990XA Unspecified injury of head, initial encounter: Secondary | ICD-10-CM

## 2017-05-08 DIAGNOSIS — W19XXXA Unspecified fall, initial encounter: Secondary | ICD-10-CM

## 2017-05-08 DIAGNOSIS — I10 Essential (primary) hypertension: Secondary | ICD-10-CM | POA: Insufficient documentation

## 2017-05-08 DIAGNOSIS — S0083XA Contusion of other part of head, initial encounter: Secondary | ICD-10-CM | POA: Insufficient documentation

## 2017-05-08 DIAGNOSIS — S50812A Abrasion of left forearm, initial encounter: Secondary | ICD-10-CM | POA: Insufficient documentation

## 2017-05-08 DIAGNOSIS — Z79899 Other long term (current) drug therapy: Secondary | ICD-10-CM | POA: Insufficient documentation

## 2017-05-08 DIAGNOSIS — W1830XA Fall on same level, unspecified, initial encounter: Secondary | ICD-10-CM | POA: Insufficient documentation

## 2017-05-08 MED ORDER — BACITRACIN ZINC 500 UNIT/GM EX OINT
TOPICAL_OINTMENT | Freq: Once | CUTANEOUS | Status: AC
  Administered 2017-05-08: 1 via TOPICAL
  Filled 2017-05-08: qty 28.35
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
## 2017-05-08 NOTE — ED Notes (Signed)
Patient transported to CT 

## 2017-05-08 NOTE — Discharge Instructions (Signed)
Fortunately, your imaging was reassuring.   Return to ER for new or worsening symptoms, any additional concerns.

## 2017-05-08 NOTE — ED Provider Notes (Signed)
[facility]-EMERGENCY DEPT Provider Note   CSN: [id number] Arrival date & time: 05/08/17  1557     History   Chief Complaint Chief Complaint  Patient presents with  . fall, ETOH    HPI Eddie Torres is a 56 y.o. male.  The history is provided by the patient and medical records. No language interpreter was used.   Eddie Torres is a 56 y.o. male  with a PMH of HTN, asthma, ETOH abuse who presents to the Emergency Department for evaluation after fall just prior to arrival.  Patient states that he was drinking a lot of beer all throughout the day and could not keep his balance.  He fell and hit the left side of his head.  He is unsure what he hit his head on.  He endorses headache and neck pain as well as abrasion to his left wrist.  Denies wrist pain.  No medications taken prior to arrival for symptoms.  Denies loss of consciousness, nausea or vomiting.  Per chart review, tetanus was updated in November 2016.  Past Medical History:  Diagnosis Date  . Asthma   . ETOH abuse   . Hypertension     There are no active problems to display for this patient.   No past surgical history on file.     Home Medications    Prior to Admission medications   Medication Sig Start Date End Date Taking? Authorizing Provider  ibuprofen (ADVIL,MOTRIN) 200 MG tablet Take 200 mg every other day by mouth.   Yes [provider]  LORazepam (ATIVAN) 0.5 MG tablet Take 0.5 mg every 8 (eight) hours as needed by mouth for anxiety.   Yes [provider]  omeprazole (PRILOSEC) 20 MG capsule Take 20 mg daily by mouth.   Yes [provider]  chlordiazePOXIDE (LIBRIUM) 25 MG capsule 50mg  PO TID x 1D, then 25-50mg  PO BID X 1D, then 25-50mg  PO QD X 1D Patient not taking: Reported on 05/08/2017 08/10/15   Danelle Berryapia, Leisa, PA-C  hydrOXYzine (ATARAX/VISTARIL) 25 MG tablet Take 1 tablet (25 mg total) by mouth 3 (three) times daily. Patient not taking: Reported  on 05/08/2017 08/10/15   Danelle Berryapia, Leisa, PA-C  ondansetron (ZOFRAN ODT) 4 MG disintegrating tablet Take 1 tablet (4 mg total) by mouth every 8 (eight) hours as needed for nausea or vomiting. Patient not taking: Reported on 05/08/2017 08/10/15   Danelle Berryapia, Leisa, PA-C  thiamine (VITAMIN B-1) 100 MG tablet Take 1 tablet (100 mg total) by mouth daily. Patient not taking: Reported on 05/08/2017 08/10/15   Danelle Berryapia, Leisa, PA-C    Family History No family history on file.  Social History Social History   Tobacco Use  . Smoking status: Never Smoker  . Smokeless tobacco: Never Used  Substance Use Topics  . Alcohol use: Yes  . Drug use: No     Allergies   Shellfish allergy   Review of Systems Review of Systems  Musculoskeletal: Positive for neck pain.  Skin: Positive for wound.  Neurological: Positive for headaches. Negative for tremors, syncope, speech difficulty, weakness and numbness.  All other systems reviewed and are negative.    Physical Exam Updated Vital Signs BP (!) 146/90 (BP Location: Right Arm)   Pulse (!) 112   Temp 97.9 F (36.6 C) (Oral)   Resp 18   SpO2 95%   Physical Exam  Constitutional: He appears well-developed and well-nourished. No distress.  HENT:  Head: Normocephalic. Head is without raccoon's eyes  and without Battle's sign.    Right Ear: No hemotympanum.  Left Ear: No hemotympanum.  Nose: Nose normal.  Cardiovascular: Normal rate, regular rhythm and normal heart sounds.  No murmur heard. Pulmonary/Chest: Effort normal and breath sounds normal. No respiratory distress. He has no wheezes. He has no rales.  Abdominal: Soft. He exhibits no distension. There is no tenderness.  Musculoskeletal: He exhibits no edema.  No C/T/L spine tenderness.  No tenderness to upper or lower extremities.  5/5 muscle strength in all 4 extremities.  Neurological: He is alert.  A&Ox3.  Goal oriented speech.  Moves all extremities spontaneously. CN2-12 grossly intact. All four  extremities NVI.   Skin: Skin is warm and dry.  1.5 cm skin tear to left distal forearm.  Nursing note and vitals reviewed.    ED Treatments / Results  Labs (all labs ordered are listed, but only abnormal results are displayed) Labs Reviewed - No data to display  EKG  EKG Interpretation None       Radiology Ct Head Wo Contrast  Result Date: 05/08/2017 CLINICAL DATA:  Fall while intoxicated. EXAM: CT HEAD WITHOUT CONTRAST CT CERVICAL SPINE WITHOUT CONTRAST TECHNIQUE: Multidetector CT imaging of the head and cervical spine was performed following the standard protocol without intravenous contrast. Multiplanar CT image reconstructions of the cervical spine were also generated. COMPARISON:  CT head and cervical spine dated May 22, 2015. FINDINGS: CT HEAD FINDINGS Brain: No evidence of acute infarction, hemorrhage, hydrocephalus, extra-axial collection or mass lesion/mass effect. Unchanged left basal ganglia lacunar infarct versus prominent perivascular space. Vascular: No hyperdense vessel or unexpected calcification. Skull: Normal. Negative for fracture or focal lesion. Sinuses/Orbits: No acute finding. Other: Small left frontal scalp hematoma. CT CERVICAL SPINE FINDINGS Alignment: Straightening of the normal cervical lordosis. No traumatic malalignment. Skull base and vertebrae: No acute fracture. No primary bone lesion or focal pathologic process. Soft tissues and spinal canal: No prevertebral fluid or swelling. No visible canal hematoma. Disc levels: Moderate degenerative disc disease and uncovertebral hypertrophy at C6-C7, similar to prior study. Scattered moderate facet arthropathy. Upper chest: Negative. Other: None. IMPRESSION: 1. No acute intracranial abnormality. Small left frontal scalp hematoma. 2.  No acute cervical spine fracture. Electronically Signed   By: Obie Dredge M.D.   On: 05/08/2017 17:11   Ct Cervical Spine Wo Contrast  Result Date: 05/08/2017 CLINICAL DATA:   Fall while intoxicated. EXAM: CT HEAD WITHOUT CONTRAST CT CERVICAL SPINE WITHOUT CONTRAST TECHNIQUE: Multidetector CT imaging of the head and cervical spine was performed following the standard protocol without intravenous contrast. Multiplanar CT image reconstructions of the cervical spine were also generated. COMPARISON:  CT head and cervical spine dated May 22, 2015. FINDINGS: CT HEAD FINDINGS Brain: No evidence of acute infarction, hemorrhage, hydrocephalus, extra-axial collection or mass lesion/mass effect. Unchanged left basal ganglia lacunar infarct versus prominent perivascular space. Vascular: No hyperdense vessel or unexpected calcification. Skull: Normal. Negative for fracture or focal lesion. Sinuses/Orbits: No acute finding. Other: Small left frontal scalp hematoma. CT CERVICAL SPINE FINDINGS Alignment: Straightening of the normal cervical lordosis. No traumatic malalignment. Skull base and vertebrae: No acute fracture. No primary bone lesion or focal pathologic process. Soft tissues and spinal canal: No prevertebral fluid or swelling. No visible canal hematoma. Disc levels: Moderate degenerative disc disease and uncovertebral hypertrophy at C6-C7, similar to prior study. Scattered moderate facet arthropathy. Upper chest: Negative. Other: None. IMPRESSION: 1. No acute intracranial abnormality. Small left frontal scalp hematoma. 2.  No acute cervical spine  fracture. Electronically Signed   By: Obie DredgeWilliam T Derry M.D.   On: 05/08/2017 17:11    Procedures Procedures (including critical care time)  Medications Ordered in ED Medications  bacitracin ointment (1 application Topical Given 05/08/17 1712)     Initial Impression / Assessment and Plan / ED Course  I have reviewed the triage vital signs and the nursing notes.  Pertinent labs & imaging results that were available during my care of the patient were reviewed by me and considered in my medical decision making (see chart for  details).    Eddie Torres is a 56 y.o. male who presents to ED for fall in the setting of alcohol intoxication just prior to arrival. Hematoma with overlying skin abrasion on exam.  No laceration requiring repair.  Tetanus is up-to-date.  CT head and cervical negative for acute injuries. Patient able to ambulate independently and tolerate PO. Daughter came to ED and agrees to drive patient home. Evaluation does not show pathology that would require ongoing emergent intervention or inpatient treatment. Discharged to home with daughter in satisfactory condition.   Final Clinical Impressions(s) / ED Diagnoses   Final diagnoses:  Injury of head, initial encounter  Fall, initial encounter    ED Discharge Orders    None       Azariya Freeman, Chase PicketJaime Pilcher, PA-C 05/08/17 2024    Little, Ambrose Finlandachel Morgan, MD 05/09/17 0040

## 2017-05-08 NOTE — ED Notes (Signed)
Bed: WA27 Expected date: 05/08/17 Expected time: 3:52 PM Means of arrival: Ambulance Comments: ETOH, fall, small head lac

## 2017-05-08 NOTE — ED Notes (Addendum)
Patient ambulatory to nurses station without difficulty to speak with daughter on the phone. Patient's daughter reports she will drive patient home.

## 2017-05-08 NOTE — ED Notes (Signed)
Patient given water

## 2017-09-12 ENCOUNTER — Other Ambulatory Visit: Payer: Self-pay

## 2017-09-12 ENCOUNTER — Encounter (HOSPITAL_COMMUNITY): Payer: Self-pay | Admitting: *Deleted

## 2017-09-12 ENCOUNTER — Emergency Department (HOSPITAL_COMMUNITY)
Admission: EM | Admit: 2017-09-12 | Discharge: 2017-09-13 | Disposition: A | Discharge status: Other Acute Inpatient Hospital | Payer: No Typology Code available for payment source | Attending: Emergency Medicine | Admitting: Emergency Medicine

## 2017-09-12 DIAGNOSIS — Z79899 Other long term (current) drug therapy: Secondary | ICD-10-CM | POA: Insufficient documentation

## 2017-09-12 DIAGNOSIS — J45909 Unspecified asthma, uncomplicated: Secondary | ICD-10-CM | POA: Insufficient documentation

## 2017-09-12 DIAGNOSIS — F10920 Alcohol use, unspecified with intoxication, uncomplicated: Secondary | ICD-10-CM | POA: Insufficient documentation

## 2017-09-12 DIAGNOSIS — Y908 Blood alcohol level of 240 mg/100 ml or more: Secondary | ICD-10-CM | POA: Insufficient documentation

## 2017-09-12 DIAGNOSIS — F333 Major depressive disorder, recurrent, severe with psychotic symptoms: Secondary | ICD-10-CM | POA: Insufficient documentation

## 2017-09-12 DIAGNOSIS — I10 Essential (primary) hypertension: Secondary | ICD-10-CM | POA: Insufficient documentation

## 2017-09-12 DIAGNOSIS — R45851 Suicidal ideations: Secondary | ICD-10-CM | POA: Insufficient documentation

## 2017-09-12 LAB — COMPREHENSIVE METABOLIC PANEL
ALBUMIN: 4.6 g/dL (ref 3.5–5.0)
ALK PHOS: 71 U/L (ref 38–126)
ALT: 37 U/L (ref 17–63)
ANION GAP: 14 (ref 5–15)
AST: 28 U/L (ref 15–41)
BILIRUBIN TOTAL: 0.2 mg/dL — AB (ref 0.3–1.2)
BUN: 12 mg/dL (ref 6–20)
CALCIUM: 9.1 mg/dL (ref 8.9–10.3)
CO2: 21 mmol/L — ABNORMAL LOW (ref 22–32)
Chloride: 104 mmol/L (ref 101–111)
Creatinine, Ser: 0.98 mg/dL (ref 0.61–1.24)
GFR calc Af Amer: >60 mL/min (ref >60)
GFR calc non Af Amer: >60 mL/min (ref >60)
GLUCOSE: 118 mg/dL — AB (ref 65–99)
POTASSIUM: 3.6 mmol/L (ref 3.5–5.1)
Sodium: 139 mmol/L (ref 135–145)
TOTAL PROTEIN: 8.2 g/dL — AB (ref 6.5–8.1)

## 2017-09-12 LAB — CBC
HCT: 46.6 % (ref 39.0–52.0)
HEMOGLOBIN: 16 g/dL (ref 13.0–17.0)
MCH: 33.8 pg (ref 26.0–34.0)
MCHC: 34.3 g/dL (ref 30.0–36.0)
MCV: 98.3 fL (ref 78.0–100.0)
PLATELETS: 218 10*3/uL (ref 150–400)
RBC: 4.74 MIL/uL (ref 4.22–5.81)
RDW: 12.3 % (ref 11.5–15.5)
WBC: 7.4 10*3/uL (ref 4.0–10.5)

## 2017-09-12 LAB — RAPID URINE DRUG SCREEN, HOSP PERFORMED
Amphetamines: NOT DETECTED
Barbiturates: NOT DETECTED
Benzodiazepines: NOT DETECTED
Cocaine: NOT DETECTED
OPIATES: NOT DETECTED
TETRAHYDROCANNABINOL: NOT DETECTED

## 2017-09-12 LAB — ETHANOL: ALCOHOL ETHYL (B): 373 mg/dL — AB (ref <10)

## 2017-09-12 LAB — SALICYLATE LEVEL: Salicylate Lvl: <7 mg/dL (ref 2.8–30.0)

## 2017-09-12 LAB — ACETAMINOPHEN LEVEL

## 2017-09-12 MED ORDER — THIAMINE HCL 100 MG/ML IJ SOLN: 100.0000 mg | Freq: Every day | INTRAMUSCULAR | Status: DC

## 2017-09-12 MED ORDER — LORAZEPAM 1 MG PO TABS: 0.0000 mg | ORAL_TABLET | Freq: Two times a day (BID) | ORAL | Status: DC

## 2017-09-12 MED ORDER — VITAMIN B-1 100 MG PO TABS
100.0000 mg | ORAL_TABLET | Freq: Every day | ORAL | Status: DC
  Administered 2017-09-12 – 2017-09-13 (×2): 100 mg via ORAL
  Filled 2017-09-12 (×2): qty 1

## 2017-09-12 MED ORDER — LORAZEPAM 1 MG PO TABS
0.0000 mg | ORAL_TABLET | Freq: Four times a day (QID) | ORAL | Status: DC
  Administered 2017-09-12 – 2017-09-13 (×2): 2 mg via ORAL
  Filled 2017-09-12 (×2): qty 2

## 2017-09-12 MED ORDER — LORAZEPAM 2 MG/ML IJ SOLN: 0.0000 mg | Freq: Four times a day (QID) | INTRAMUSCULAR | Status: DC

## 2017-09-12 MED ORDER — ACETAMINOPHEN 325 MG PO TABS
650.0000 mg | ORAL_TABLET | Freq: Once | ORAL | Status: AC
  Administered 2017-09-12: 650 mg via ORAL
  Filled 2017-09-12: qty 2

## 2017-09-12 NOTE — ED Triage Notes (Signed)
Pt come in with [facility] intoxicated. Pt verbalizes he wants to hurts self. 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 If it wasn't for them I would have killed myself a long time ago.

## 2017-09-12 NOTE — ED Notes (Signed)
Date and time results received: 09/12/17 6:21 PM   Test: 373 Critical Value: ethanol  Name of Provider Notified:   Orders Received? Or Actions Taken?:

## 2017-09-12 NOTE — ED Notes (Signed)
TTS assessment in progress. 

## 2017-09-12 NOTE — ED Provider Notes (Signed)
[facility]-EMERGENCY DEPT Provider Note   CSN: [id number] Arrival date & time: 09/12/17  1708     History   Chief Complaint Chief Complaint  Patient presents with  . Suicidal  . Alcohol Intoxication    HPI Eddie Torres is a 57 y.o. male who presents for evaluation of alcohol intoxication and suicidal ideation.  Patient reports that he has been drinking "a lot" today.  He states that he drinks on a daily basis.  Daughter reports that patient will drink anywhere between 1-340 ounce beers a day.  She states that he does this every day.  Patient states that he has had thoughts of wanting her to kill himself.  He states that these thoughts have become more frequent over the last few weeks.  He states that he is started thinking a lot about his life and how he is "an old man who does not have a good life."  He states that he has not thought of a specific plan but states "I have access to a lot of tools and I could figure something out."  Patient states he has not had any thoughts of wanting to hurt or kill anybody else.  He states that he hears noises all the time that he gets scared from the noises.  He states he tries to listen but they do not tell him anything.  He denies any visual hallucinations.  Patient states that he thinks a lot about being here for his daughters and states that "I hope I can hang on that long to watch them grow."Patient denies any heroin, cocaine, marijuana use.  Patient denies any recent sicknesses, chest pain, difficulty breathing.  The history is provided by the patient.    Past Medical History:  Diagnosis Date  . Asthma   . ETOH abuse   . Hypertension     There are no active problems to display for this patient.   History reviewed. No pertinent surgical history.      Home Medications    Prior to Admission medications   Medication Sig Start Date End Date Taking? Authorizing Provider  acetaminophen (TYLENOL) 500 MG tablet Take  500 mg by mouth every 6 (six) hours as needed for mild pain.   Yes [provider]  ibuprofen (ADVIL,MOTRIN) 200 MG tablet Take 200 mg every other day by mouth.   Yes [provider]  omeprazole (PRILOSEC) 20 MG capsule Take 20 mg by mouth daily.   Yes [provider]  chlordiazePOXIDE (LIBRIUM) 25 MG capsule 50mg  PO TID x 1D, then 25-50mg  PO BID X 1D, then 25-50mg  PO QD X 1D Patient not taking: Reported on 05/08/2017 08/10/15   Danelle Berryapia, Leisa, PA-C  hydrOXYzine (ATARAX/VISTARIL) 25 MG tablet Take 1 tablet (25 mg total) by mouth 3 (three) times daily. Patient not taking: Reported on 05/08/2017 08/10/15   Danelle Berryapia, Leisa, PA-C  ondansetron (ZOFRAN ODT) 4 MG disintegrating tablet Take 1 tablet (4 mg total) by mouth every 8 (eight) hours as needed for nausea or vomiting. Patient not taking: Reported on 05/08/2017 08/10/15   Danelle Berryapia, Leisa, PA-C  thiamine (VITAMIN B-1) 100 MG tablet Take 1 tablet (100 mg total) by mouth daily. Patient not taking: Reported on 05/08/2017 08/10/15   Danelle Berryapia, Leisa, PA-C    Family History No family history on file.  Social History Social History   Tobacco Use  . Smoking status: Never Smoker  . Smokeless tobacco: Never Used  Substance Use Topics  . Alcohol use:  Yes  . Drug use: No     Allergies   Shellfish allergy   Review of Systems Review of Systems  Psychiatric/Behavioral: Positive for suicidal ideas.     Physical Exam Updated Vital Signs BP 114/77 (BP Location: Right Arm)   Pulse (!) 101   Temp 98.5 F (36.9 C) (Oral)   Resp 18   Ht 5\' 8"  (1.727 m)   Wt 81.6 kg (180 lb)   SpO2 96%   BMI 27.37 kg/m   Physical Exam  Constitutional: He is oriented to person, place, and time. He appears well-developed and well-nourished.  HENT:  Head: Normocephalic and atraumatic.  Mouth/Throat: Oropharynx is clear and moist and mucous membranes are normal.  Eyes: Pupils are equal, round, and reactive to light. Conjunctivae, EOM and lids  are normal.  Neck: Full passive range of motion without pain.  Cardiovascular: Normal rate, regular rhythm, normal heart sounds and normal pulses. Exam reveals no gallop and no friction rub.  No murmur heard. Pulmonary/Chest: Effort normal and breath sounds normal.  Abdominal: Soft. Normal appearance. There is no tenderness. There is no rigidity and no guarding.  Musculoskeletal: Normal range of motion.  Neurological: He is alert and oriented to person, place, and time.  Skin: Skin is warm and dry. Capillary refill takes less than 2 seconds.  Psychiatric: He has a normal mood and affect. His speech is normal.  Nursing note and vitals reviewed.    ED Treatments / Results  Labs (all labs ordered are listed, but only abnormal results are displayed) Labs Reviewed  COMPREHENSIVE METABOLIC PANEL - Abnormal; Notable for the following components:      Result Value   CO2 21 (*)    Glucose, Bld 118 (*)    Total Protein 8.2 (*)    Total Bilirubin 0.2 (*)    All other components within normal limits  ETHANOL - Abnormal; Notable for the following components:   Alcohol, Ethyl (B) 373 (*)    All other components within normal limits  ACETAMINOPHEN LEVEL - Abnormal; Notable for the following components:   Acetaminophen (Tylenol), Serum <10 (*)    All other components within normal limits  CBC  RAPID URINE DRUG SCREEN, HOSP PERFORMED  SALICYLATE LEVEL    EKG None  Radiology No results found.  Procedures Procedures (including critical care time)  Medications Ordered in ED Medications  LORazepam (ATIVAN) injection 0-4 mg ( Intravenous See Alternative 09/12/17 2120)    Or  LORazepam (ATIVAN) tablet 0-4 mg (2 mg Oral Given 09/12/17 2120)  LORazepam (ATIVAN) tablet 0-4 mg (has no administration in time range)  thiamine (VITAMIN B-1) tablet 100 mg (100 mg Oral Given 09/12/17 2120)    Or  thiamine (B-1) injection 100 mg ( Intravenous See Alternative 09/12/17 2120)  acetaminophen (TYLENOL)  tablet 500 mg (has no administration in time range)  acetaminophen (TYLENOL) tablet 650 mg (650 mg Oral Given 09/12/17 2118)     Initial Impression / Assessment and Plan / ED Course  I have reviewed the triage vital signs and the nursing notes.  Pertinent labs & imaging results that were available during my care of the patient were reviewed by me and considered in my medical decision making (see chart for details).     57 year old male who presents for evaluation of alcohol intoxication and suicidal ideation.  Patient reports he is a daily drinker.  Also reports suicidal ideations.  No specific plan but states he has access to enough told to accomplish it.  No homicidal ideations, auditory or visual hallucinations. Patient is afebril, non-toxic appearing, sitting comfortably on examination table. Vital signs reviewed and stable. Patient is slightly tachycardic. Will continue to monitor.  Medical clearance labs ordered.  We will plan to have TTS consulted.  Discussed with patient's daughter via phone.  She states that her dad has been expressing more frequent, more severe suicidal ideations over the last week.  She states that he is also been making vague comments saying that a "hope that I am here to watch your lives."   Acetaminophen level unremarkable.  Salicylate level unremarkable.  CMP shows bicarb of 21, glucose of 118.  Anion gap of 14.  CBC without any significant leukocytosis, anemia.  Rapid urine drug screen is negative.  Alcohol level is 373.  Discussed with tray from TTS.  They are recommending inpatient treatment at this time.  Patient placed in psych hold.  I personally notify daughter of patient status.  She is agreeable to plan.  Final Clinical Impressions(s) / ED Diagnoses   Final diagnoses:  Suicidal ideations  Alcoholic intoxication without complication [facility])    ED Discharge Orders    None       Rosana Hoes 09/13/17 0138    Lorre Nick,  MD 09/13/17 [phone]
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
## 2017-09-12 NOTE — ED Notes (Signed)
Daughter's name is Eddie Torres and her phone # is [phone], She said to call with any questions.

## 2017-09-13 ENCOUNTER — Encounter (HOSPITAL_COMMUNITY): Payer: Self-pay | Admitting: *Deleted

## 2017-09-13 ENCOUNTER — Inpatient Hospital Stay (HOSPITAL_COMMUNITY)
Admission: AD | Admit: 2017-09-13 | Discharge: 2017-09-16 | DRG: 885 | Disposition: A | Discharge status: Home / Self Care | Payer: Federal, State, Local not specified - Other | Source: Intra-hospital | Attending: Psychiatry | Admitting: Psychiatry

## 2017-09-13 ENCOUNTER — Other Ambulatory Visit: Payer: Self-pay

## 2017-09-13 DIAGNOSIS — Z91013 Allergy to seafood: Secondary | ICD-10-CM | POA: Diagnosis not present

## 2017-09-13 DIAGNOSIS — Y908 Blood alcohol level of 240 mg/100 ml or more: Secondary | ICD-10-CM | POA: Diagnosis not present

## 2017-09-13 DIAGNOSIS — F332 Major depressive disorder, recurrent severe without psychotic features: Principal | ICD-10-CM | POA: Diagnosis present

## 2017-09-13 DIAGNOSIS — F1024 Alcohol dependence with alcohol-induced mood disorder: Secondary | ICD-10-CM | POA: Diagnosis present

## 2017-09-13 DIAGNOSIS — Z811 Family history of alcohol abuse and dependence: Secondary | ICD-10-CM

## 2017-09-13 DIAGNOSIS — Z915 Personal history of self-harm: Secondary | ICD-10-CM

## 2017-09-13 DIAGNOSIS — Z560 Unemployment, unspecified: Secondary | ICD-10-CM

## 2017-09-13 DIAGNOSIS — R45851 Suicidal ideations: Secondary | ICD-10-CM | POA: Diagnosis present

## 2017-09-13 DIAGNOSIS — I10 Essential (primary) hypertension: Secondary | ICD-10-CM | POA: Diagnosis present

## 2017-09-13 DIAGNOSIS — F410 Panic disorder [episodic paroxysmal anxiety] without agoraphobia: Secondary | ICD-10-CM | POA: Diagnosis not present

## 2017-09-13 DIAGNOSIS — Z79899 Other long term (current) drug therapy: Secondary | ICD-10-CM

## 2017-09-13 DIAGNOSIS — R51 Headache: Secondary | ICD-10-CM

## 2017-09-13 DIAGNOSIS — Z8249 Family history of ischemic heart disease and other diseases of the circulatory system: Secondary | ICD-10-CM

## 2017-09-13 DIAGNOSIS — F419 Anxiety disorder, unspecified: Secondary | ICD-10-CM | POA: Diagnosis not present

## 2017-09-13 DIAGNOSIS — F1020 Alcohol dependence, uncomplicated: Secondary | ICD-10-CM | POA: Diagnosis not present

## 2017-09-13 HISTORY — DX: Anxiety disorder, unspecified: F41.9

## 2017-09-13 HISTORY — DX: Bipolar disorder, unspecified: F31.9

## 2017-09-13 MED ORDER — MAGNESIUM HYDROXIDE 400 MG/5ML PO SUSP: 30.0000 mL | Freq: Every day | ORAL | Status: DC | PRN

## 2017-09-13 MED ORDER — TRAZODONE HCL 50 MG PO TABS
50.0000 mg | ORAL_TABLET | Freq: Every evening | ORAL | Status: DC | PRN
  Administered 2017-09-13: 50 mg via ORAL
  Filled 2017-09-13: qty 1

## 2017-09-13 MED ORDER — ADULT MULTIVITAMIN W/MINERALS CH
1.0000 | ORAL_TABLET | Freq: Every day | ORAL | Status: DC
  Administered 2017-09-13 – 2017-09-16 (×4): 1 via ORAL
  Filled 2017-09-13 (×6): qty 1

## 2017-09-13 MED ORDER — ACETAMINOPHEN 325 MG PO TABS
650.0000 mg | ORAL_TABLET | Freq: Four times a day (QID) | ORAL | Status: DC | PRN
  Administered 2017-09-13 – 2017-09-16 (×2): 650 mg via ORAL
  Filled 2017-09-13 (×2): qty 2

## 2017-09-13 MED ORDER — HYDROXYZINE HCL 25 MG PO TABS
25.0000 mg | ORAL_TABLET | Freq: Four times a day (QID) | ORAL | Status: AC | PRN
  Administered 2017-09-13 – 2017-09-15 (×3): 25 mg via ORAL
  Filled 2017-09-13: qty 10
  Filled 2017-09-13 (×3): qty 1

## 2017-09-13 MED ORDER — ALUM & MAG HYDROXIDE-SIMETH 200-200-20 MG/5ML PO SUSP: 30.0000 mL | ORAL | Status: DC | PRN

## 2017-09-13 MED ORDER — VITAMIN B-1 100 MG PO TABS
100.0000 mg | ORAL_TABLET | Freq: Every day | ORAL | Status: DC
  Administered 2017-09-14 – 2017-09-16 (×3): 100 mg via ORAL
  Filled 2017-09-13 (×5): qty 1

## 2017-09-13 MED ORDER — SERTRALINE HCL 50 MG PO TABS
50.0000 mg | ORAL_TABLET | Freq: Every day | ORAL | Status: DC
  Administered 2017-09-13 – 2017-09-16 (×4): 50 mg via ORAL
  Filled 2017-09-13 (×4): qty 1
  Filled 2017-09-13: qty 7
  Filled 2017-09-13 (×2): qty 1

## 2017-09-13 MED ORDER — LOPERAMIDE HCL 2 MG PO CAPS: 2.0000 mg | ORAL_CAPSULE | ORAL | Status: AC | PRN

## 2017-09-13 MED ORDER — CLONIDINE HCL 0.1 MG PO TABS
0.1000 mg | ORAL_TABLET | Freq: Three times a day (TID) | ORAL | Status: DC | PRN
  Administered 2017-09-13 – 2017-09-14 (×2): 0.1 mg via ORAL
  Filled 2017-09-13 (×2): qty 1

## 2017-09-13 MED ORDER — LORAZEPAM 1 MG PO TABS
1.0000 mg | ORAL_TABLET | Freq: Two times a day (BID) | ORAL | Status: AC
  Administered 2017-09-15 (×2): 1 mg via ORAL
  Filled 2017-09-13 (×2): qty 1

## 2017-09-13 MED ORDER — ONDANSETRON 4 MG PO TBDP: 4.0000 mg | ORAL_TABLET | Freq: Four times a day (QID) | ORAL | Status: AC | PRN

## 2017-09-13 MED ORDER — LORAZEPAM 1 MG PO TABS: 1.0000 mg | ORAL_TABLET | Freq: Four times a day (QID) | ORAL | Status: AC | PRN

## 2017-09-13 MED ORDER — ACETAMINOPHEN 500 MG PO TABS: 500.0000 mg | ORAL_TABLET | Freq: Four times a day (QID) | ORAL | Status: DC | PRN

## 2017-09-13 MED ORDER — TRAZODONE HCL 50 MG PO TABS
50.0000 mg | ORAL_TABLET | Freq: Every evening | ORAL | Status: DC | PRN
  Filled 2017-09-13 (×4): qty 1

## 2017-09-13 MED ORDER — LORAZEPAM 1 MG PO TABS
1.0000 mg | ORAL_TABLET | Freq: Four times a day (QID) | ORAL | Status: AC
  Administered 2017-09-13 (×3): 1 mg via ORAL
  Filled 2017-09-13 (×3): qty 1

## 2017-09-13 MED ORDER — PANTOPRAZOLE SODIUM 40 MG PO TBEC
40.0000 mg | DELAYED_RELEASE_TABLET | Freq: Every day | ORAL | Status: DC
  Administered 2017-09-13 – 2017-09-16 (×4): 40 mg via ORAL
  Filled 2017-09-13 (×7): qty 1

## 2017-09-13 MED ORDER — LORAZEPAM 1 MG PO TABS
1.0000 mg | ORAL_TABLET | Freq: Three times a day (TID) | ORAL | Status: AC
  Administered 2017-09-14 (×3): 1 mg via ORAL
  Filled 2017-09-13 (×3): qty 1

## 2017-09-13 MED ORDER — LORAZEPAM 1 MG PO TABS
1.0000 mg | ORAL_TABLET | Freq: Every day | ORAL | Status: AC
  Administered 2017-09-16: 1 mg via ORAL
  Filled 2017-09-13: qty 1

## 2017-09-13 NOTE — Tx Team (Deleted)
Initial Treatment Plan 09/13/2017 4:12 PM Mallie Snooksimothy L Atkins WUJ:[id number]    PATIENT STRESSORS: Financial difficulties Legal issue Medication change or noncompliance Occupational concerns Substance abuse   PATIENT STRENGTHS: Ability for insight Average or above average intelligence Capable of independent living Communication skills General fund of knowledge Motivation for treatment/growth Religious Affiliation Special hobby/interest Supportive family/friends   PATIENT IDENTIFIED PROBLEMS: "anxiety"  "depression"  "alcohol abuse"  "panic attacks"               DISCHARGE CRITERIA:  Ability to meet basic life and health needs Adequate post-discharge living arrangements Improved stabilization in mood, thinking, and/or behavior Medical problems require only outpatient monitoring Motivation to continue treatment in a less acute level of care Need for constant or close observation no longer present Reduction of life-threatening or endangering symptoms to within safe limits Withdrawal symptoms are absent or subacute and managed without 24-hour nursing intervention  PRELIMINARY DISCHARGE PLAN: Attend aftercare/continuing care group Attend PHP/IOP Attend 12-step recovery group Outpatient therapy Participate in family therapy Placement in alternative living arrangements  PATIENT/FAMILY INVOLVEMENT: This treatment plan has been presented to and reviewed with the patient, Mallie Snooksimothy L Dimiceli..  The patient and family have been given the opportunity to ask questions and make suggestions.  Quintella ReichertKnight, Ras Kollman LamontShephard, [place]RN 09/13/2017, 4:12 PM

## 2017-09-13 NOTE — Plan of Care (Signed)
Nurse discussed anxiety, depression, coping skills with patient. 
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
## 2017-09-13 NOTE — Tx Team (Signed)
Initial Treatment Plan 09/13/2017 4:09 PM Eddie Torres [id number]    PATIENT STRESSORS: Financial difficulties Medication change or noncompliance Occupational concerns Substance abuse   PATIENT STRENGTHS: Ability for insight Active sense of humor Capable of independent living Communication skills General fund of knowledge Motivation for treatment/growth Supportive family/friends Work skills   PATIENT IDENTIFIED PROBLEMS:                      DISCHARGE CRITERIA:  Ability to meet basic life and health needs Adequate post-discharge living arrangements Improved stabilization in mood, thinking, and/or behavior Medical problems require only outpatient monitoring Motivation to continue treatment in a less acute level of care Need for constant or close observation no longer present Reduction of life-threatening or endangering symptoms to within safe limits Safe-care adequate arrangements made Verbal commitment to aftercare and medication compliance Withdrawal symptoms are absent or subacute and managed without 24-hour nursing intervention  PRELIMINARY DISCHARGE PLAN:   PATIENT/FAMILY INVOLVEMENT: This treatment plan has been presented to and reviewed with the patient, Eddie Torres..  The patient and family have been given the opportunity to ask questions and make suggestions.  Quintella ReichertKnight, Tannen Vandezande Johnson PrairieShephard, [place]RN 09/13/2017, 4:09 PM
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
## 2017-09-13 NOTE — ED Notes (Signed)
[name]/ Eddie Torres HERE FOR TRANSPORT

## 2017-09-13 NOTE — ED Notes (Signed)
ED TO INPATIENT HANDOFF REPORT  Name/Age/Gender Eddie Torres 57 y.o. male  Code Status    Code Status Orders  (From admission, onward)        Start     Ordered   09/12/17 2052  Full code  Continuous     09/12/17 2052    Code Status History    Date Active Date Inactive Code Status Order ID Comments User Context   05/25/2013 2058 05/26/2013 1938 Full Code [id number]  Mackie Pai., MD ED      Home/SNF/Other Home  Chief Complaint SI  Level of Care/Admitting Diagnosis ED Disposition    None      Medical History Past Medical History:  Diagnosis Date  . Asthma   . ETOH abuse   . Hypertension     Allergies Allergies  Allergen Reactions  . Shellfish Allergy Anaphylaxis    IV Location/Drains/Wounds Patient Lines/Drains/Airways Status   Active Line/Drains/Airways    Name:   Placement date:   Placement time:   Site:   Days:   Wound / Incision (Open or Dehisced) 05/22/15 Laceration Head Posterior   05/22/15    1913    Head   845          Labs/Imaging Results for orders placed or performed during the hospital encounter of 09/12/17 (from the past 48 hour(s))  Rapid urine drug screen (hospital performed)     Status: None   Collection Time: 09/12/17  5:20 PM  Result Value Ref Range   Opiates NONE DETECTED NONE DETECTED   Cocaine NONE DETECTED NONE DETECTED   Benzodiazepines NONE DETECTED NONE DETECTED   Amphetamines NONE DETECTED NONE DETECTED   Tetrahydrocannabinol NONE DETECTED NONE DETECTED   Barbiturates NONE DETECTED NONE DETECTED    Comment: (NOTE) DRUG SCREEN FOR MEDICAL PURPOSES ONLY.  IF CONFIRMATION IS NEEDED FOR ANY PURPOSE, NOTIFY LAB WITHIN 5 DAYS. LOWEST DETECTABLE LIMITS FOR URINE DRUG SCREEN Drug Class                     Cutoff (ng/mL) Amphetamine and metabolites    1000 Barbiturate and metabolites    200 Benzodiazepine                 993 Tricyclics and metabolites     300 Opiates and metabolites        300 Cocaine and  metabolites        300 THC                            50 Performed at [facility], [place] [address], [place], [place] [address]   Comprehensive metabolic panel     Status: Abnormal   Collection Time: 09/12/17  5:29 PM  Result Value Ref Range   Sodium 139 135 - 145 mmol/L   Potassium 3.6 3.5 - 5.1 mmol/L   Chloride 104 101 - 111 mmol/L   CO2 21 (L) 22 - 32 mmol/L   Glucose, Bld 118 (H) 65 - 99 mg/dL   BUN 12 6 - 20 mg/dL   Creatinine, Ser 0.98 0.61 - 1.24 mg/dL   Calcium 9.1 8.9 - 10.3 mg/dL   Total Protein 8.2 (H) 6.5 - 8.1 g/dL   Albumin 4.6 3.5 - 5.0 g/dL   AST 28 15 - 41 U/L   ALT 37 17 - 63 U/L   Alkaline Phosphatase 71 38 - 126 U/L   Total Bilirubin  0.2 (L) 0.3 - 1.2 mg/dL   GFR calc non Af Amer >60 >60 mL/min   GFR calc Af Amer >60 >60 mL/min    Comment: (NOTE) The eGFR has been calculated using the CKD EPI equation. This calculation has not been validated in all clinical situations. eGFR's persistently <60 mL/min signify possible Chronic Kidney Disease.    Anion gap 14 5 - 15    Comment: Performed at [facility], [place] [address], [place], [place] [address]  Ethanol     Status: Abnormal   Collection Time: 09/12/17  5:29 PM  Result Value Ref Range   Alcohol, Ethyl (B) 373 (HH) <10 mg/dL    Comment:        LOWEST DETECTABLE LIMIT FOR SERUM ALCOHOL IS 10 mg/dL FOR MEDICAL PURPOSES ONLY CRITICAL RESULT CALLED TO, READ BACK BY AND VERIFIED WITH: KELLY B AT 1821 ON 09/12/2017 BY MOSLEY,J Performed at [facility] [address] [place], [place] [address]   cbc     Status: None   Collection Time: 09/12/17  5:29 PM  Result Value Ref Range   WBC 7.4 4.0 - 10.5 K/uL   RBC 4.74 4.22 - 5.81 MIL/uL   Hemoglobin 16.0 13.0 - 17.0 g/dL   HCT 46.6 39.0 - 52.0 %   MCV 98.3 78.0 - 100.0 fL   MCH 33.8 26.0 - 34.0 pg   MCHC 34.3 30.0 - 36.0 g/dL   RDW 12.3 11.5 - 15.5 %   Platelets 218 150 - 400 K/uL    Comment:  Performed at [facility], [place] [address], [place], [place] [address]  Salicylate level     Status: None   Collection Time: 09/12/17  6:07 PM  Result Value Ref Range   Salicylate Lvl <8.6 2.8 - 30.0 mg/dL    Comment: Performed at [facility], [place] [address] [place], [place] [address]  Acetaminophen level     Status: Abnormal   Collection Time: 09/12/17  6:07 PM  Result Value Ref Range   Acetaminophen (Tylenol), Serum <10 (L) 10 - 30 ug/mL    Comment:        THERAPEUTIC CONCENTRATIONS VARY SIGNIFICANTLY. A RANGE OF 10-30 ug/mL MAY BE AN EFFECTIVE CONCENTRATION FOR MANY PATIENTS. HOWEVER, SOME ARE BEST TREATED AT CONCENTRATIONS OUTSIDE THIS RANGE. ACETAMINOPHEN CONCENTRATIONS >150 ug/mL AT 4 HOURS AFTER INGESTION AND >50 ug/mL AT 12 HOURS AFTER INGESTION ARE OFTEN ASSOCIATED WITH TOXIC REACTIONS. Performed at [facility], [place] [address] [place], [place] [address]    No results found.  Pending Labs [employer] (From admission, onward)   Start     Ordered   Signed and Held  Hemoglobin A1c  [facility] Morning draw,   R     Signed and Held   Signed and Held  Lipid panel  [facility] Morning draw,   R     Signed and Held   Signed and Held  TSH  [facility] Morning draw,   R     Signed and Held      Vitals/Pain Today's Vitals   09/13/17 0606 09/13/17 0726 09/13/17 0730 09/13/17 0913  BP: (!) 161/100   (!) 159/99  Pulse: 99   (!) 105  Resp: 18   20  Temp: 99.3 F (37.4 C)   97.8 F (36.6 C)  TempSrc: Oral   Oral  SpO2: 98%   94%  Weight:      Height:  PainSc:  0-No pain 1      Isolation Precautions No active isolations  Medications Medications  LORazepam (ATIVAN) injection 0-4 mg ( Intravenous See Alternative 09/13/17 0758)    Or  LORazepam (ATIVAN) tablet 0-4 mg (2 mg Oral Given 09/13/17 0758)  LORazepam (ATIVAN) tablet 0-4 mg (has no administration in time range)  thiamine (VITAMIN B-1) tablet 100 mg (100 mg Oral  Given 09/13/17 0922)    Or  thiamine (B-1) injection 100 mg ( Intravenous See Alternative 09/13/17 0922)  acetaminophen (TYLENOL) tablet 500 mg (has no administration in time range)  acetaminophen (TYLENOL) tablet 650 mg (650 mg Oral Given 09/12/17 2118)    Mobility walks

## 2017-09-13 NOTE — Progress Notes (Signed)
No charge note:   Discussed this patient's care with Dr. Jama Flavorsobos regarding his elevated blood pressures. Pt carries Dx of HTN though is not on home medications. Elevated here as high as 161/100 in the setting of mild tachycardia and clinical findings suggestive of EtOH withdrawal. EtOH level 373 yesterday evening, so withdrawal may continue to worsen through tomorrow. He is on CIWA.   - Plan to give prn clonidine 0.1mg  TID prn SBP >11680mmHg or DBP >16500mmHg as ordered.  - Discussed with Dr. Jama Flavorsobos.  - Please let us know if any further concerns.   Hazeline Junkeryan Angeleena Dueitt, MD 09/13/2017 1:28 PM

## 2017-09-13 NOTE — Plan of Care (Signed)
Nurse discussed depression, anxiety, coping skills with patient.  

## 2017-09-13 NOTE — [facility] Suicide Risk Assessment (Signed)
[facility]   Nursing information obtained from:   Patient and chart Demographic factors:   57 year old divorced male, unemployed Current Mental Status:   See below Loss Factors:   Alcohol use disorder, financial difficulties, not being able to drive his vehicle. Historical Factors:   Alcohol dependence, depression, anxiety, alcohol induced anxiety and depression. Risk Reduction Factors:   Resilience  Total Time spent with patient: 45 minutes Principal Problem: Alcohol use disorder, alcohol induced mood disorder.   Diagnosis:   Patient Active Problem List   Diagnosis Date Noted  . Severe recurrent major depression without psychotic features (HCC) [F33.2] 09/13/2017     Continued Clinical Symptoms:    The "Alcohol Use Disorders Identification Test", Guidelines for Use in Primary Care, Second Edition.  World Science writerHealth Organization [facility](WHO). Score between 0-7:  no or low risk or alcohol related problems. Score between 8-15:  moderate risk of alcohol related problems. Score between 16-19:  high risk of alcohol related problems. Score 20 or above:  warrants further diagnostic evaluation for alcohol dependence and treatment.   CLINICAL FACTORS:  57 year old divorced male, lives alone in rooming house, presented due to depression and suicidal ideations in the context of alcohol intoxication.  Reports long history of alcohol use disorder.  He has been drinking more heavily recently.  Today denies suicidal ideations.   Psychiatric Specialty Exam: Physical Exam  ROS  There were no vitals taken for this visit.There is no height or weight on file to calculate BMI.  See admission note mental status exam    COGNITIVE FEATURES THAT CONTRIBUTE TO RISK:  Closed-mindedness and Loss of executive function    SUICIDE RISK:   Moderate:  Frequent suicidal ideation with limited intensity, and duration, some specificity in terms of plans, no associated intent, good  self-control, limited dysphoria/symptomatology, some risk factors present, and identifiable protective factors, including available and accessible social support.  PLAN OF CARE: Patient will be admitted to inpatient psychiatric unit for stabilization and safety. Will provide and encourage milieu participation. Provide medication management and maked adjustments as needed.  We will also provide medication to minimize risk of withdrawal. will follow daily.    I certify that inpatient services furnished can reasonably be expected to improve the patient's condition.   Craige CottaFernando A Cobos, MD 09/13/2017, 1:34 PM

## 2017-09-13 NOTE — Progress Notes (Signed)
Patient 57 yrs old, voluntary.  BAL 373 upon admission.  Drinks 2-3 beers every other day.  Started drinking at age 57 yrs old, sober 2 years when daughters were born.  Patient denied heroin, cocaine, THC and all other drugs.  Has not seen MD in years, no insurance, no job.  Had DUI in 2003, self medicated with beer.  No drivers license 16 yrs.  Truck is locked and he cannot drive because he has no job and cannot catch up on his fee.  Regular diet.  Does have trouble concentrating. denied SI and HI, contracts for safety.  Denied A/V hallucinations.  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  Rated hopeless 7, anxiety and depression 10+.  Lives in boarding house (rents room) at [facility] [place], [place]   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  Two daughters in college.  12th grade education.   [facility]  Fall risk information given and discussed with patient.  High Fall Risk.   Patient oriented to unit. Food/drink given patient. Patient was cooperative and pleasant.

## 2017-09-13 NOTE — ED Notes (Signed)
Pt did not receive 0300 ATIVAN. WILL ADMINISTER NOW FOR CIWA 11

## 2017-09-13 NOTE — ED Provider Notes (Signed)
Vitals:   09/13/17 0039 09/13/17 0606  BP: 114/77 (!) 161/100  Pulse: (!) 101 99  Resp: 18 18  Temp: 98.5 F (36.9 C) 99.3 F (37.4 C)  SpO2: 96% 98%   HTN this am.  Ativan dose is overdue.  Otherwise stable this am   Linwood DibblesKnapp, Carrissa Taitano, MD 09/13/17 [phone]

## 2017-09-13 NOTE — H&P (Signed)
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 Severe recurrent major depression without psychotic features ([place]) [F33.2] 09/13/2017   History of Present Illness: Patient is a 57 year old male. Reports history of alcohol use disorder.  States he normally drinks 4-5 beers per day but acknowledges that amount consumed has increased recently and has been drinking up to 12 beers per day. Patient presented to the emergency room intoxicated,  reporting suicidal ideations, without specific plan. Admission blood alcohol level was 373.  Admission urine drug screen was negative. Currently denies suicidal ideations.  Does state he has been anxious and somewhat depressed recently which he attributes to psychosocial stressors.  He states he does not have a source of income and in order to make money needs to be able to drive. He has an alcohol monitor installed in his vehicle based on history of DUI but states he cannot afford to have it turned on the result of which is that he has not been able to drive and therefore been unable to obtain employment. He endorses some neurovegetative symptoms as below.  Associated Signs/Symptoms: Depression Symptoms:  depressed mood, suicidal thoughts without plan, anxiety, panic attacks, decreased appetite, (Hypo) Manic Symptoms:  No  Anxiety Symptoms: Describes increased anxiety due to stressors as above.  Reports panic attacks and excessive worrying. Psychotic Symptoms:  Denies  PTSD Symptoms: Does not endorse  Total Time spent with patient: 45 minutes  Past Psychiatric History: He reports a history of depression and anxiety.  He  denies any history of suicide attempts and denies any history of self injurious behaviors.  Denies history of mania, denies history of psychosis, denies history of PTSD. Denies history of violence. States that at some point in the past was prescribed Ativan for anxiety but that he has not been on any psychiatric medications for years.  Is the patient at risk to self? Yes.    Has the patient been a risk to self in the past 6 months? No.  Has the patient been a risk to self within the distant past? No.  Is the patient a risk to others? No.  Has the patient been a risk to others in the past 6 months? No.  Has the patient been a risk to others within the distant past? No.   Prior Inpatient Therapy:  denies  Prior Outpatient Therapy:  no  Alcohol Screening:   Substance Abuse History in the last 12 months: States he has a long history of alcohol use disorder.  He started drinking as an adolescent.  He has had significant periods of sobriety in the past up to 2 years at one point.  Alcohol is substance of choice.  She denies any drug abuse.  Recently he has been drinking up to 12 beers per day. Consequences of Substance Abuse: Reports history of withdrawal symptoms such as tremors, diaphoresis.  Denies history of DTs.  Denies history of seizures.  Does not endorse recent blackouts.  Describes history of DUI charges for which he lost his driver's license for a long time. Previous Psychotropic Medications: States he was prescribed Ativan for anxiety in the past, but not recently.  He has not been on any psychiatric medications for "a long time" Psychological  Evaluations: No  Past Medical History: Patient states he has been told he has hypertension in the past but has not been treated for it. Past Medical History:  Diagnosis Date  . Asthma   . ETOH abuse   . Hypertension    No past surgical history on file. Family History: Both parents are deceased.  He also has a brother who is deceased.  Father  passed away from complications of alcohol dependence.  Brother passed away from MI.  Mother passed away from pneumonia.   Family Psychiatric  History: Reports history of alcohol use disorder in the family in (father).  States sibling suffers from anxiety.  No suicides in family. Tobacco Screening:   Social History: Patient is divorced.  Has 2 adult daughters.  Currently he is living alone.  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  Currently not employed.  Describes being unable to drive his vehicle in (see above) as a major stressor. Social History   Substance and Sexual Activity  Alcohol Use Yes     Social History   Substance and Sexual Activity  Drug Use No    Additional Social History:  Allergies:   Allergies  Allergen Reactions  . Shellfish Allergy Anaphylaxis   Lab Results:  Results for orders placed or performed during the hospital encounter of 09/12/17 (from the past 48 hour(s))  Rapid urine drug screen (hospital performed)     Status: None   Collection Time: 09/12/17  5:20 PM  Result Value Ref Range   Opiates NONE DETECTED NONE DETECTED   Cocaine NONE DETECTED NONE DETECTED   Benzodiazepines NONE DETECTED NONE DETECTED   Amphetamines NONE DETECTED NONE DETECTED   Tetrahydrocannabinol NONE DETECTED NONE DETECTED   Barbiturates NONE DETECTED NONE DETECTED    Comment: (NOTE) DRUG SCREEN FOR MEDICAL PURPOSES ONLY.  IF CONFIRMATION IS NEEDED FOR ANY PURPOSE, NOTIFY LAB WITHIN 5 DAYS. LOWEST DETECTABLE LIMITS FOR URINE DRUG SCREEN Drug Class                     Cutoff (ng/mL) Amphetamine and metabolites    1000 Barbiturate and metabolites    200 Benzodiazepine                 470 Tricyclics and metabolites     300 Opiates and metabolites        300 Cocaine and metabolites        300 THC                            50 Performed at [facility], [place] [address] [place], [place] [address]    Comprehensive metabolic panel     Status: Abnormal   Collection Time: 09/12/17  5:29 PM  Result Value Ref Range   Sodium 139 135 - 145 mmol/L   Potassium 3.6 3.5 - 5.1 mmol/L   Chloride 104 101 - 111 mmol/L   CO2 21 (L) 22 - 32 mmol/L   Glucose, Bld 118 (H) 65 - 99 mg/dL   BUN 12 6 - 20 mg/dL   Creatinine, Ser 0.98 0.61 - 1.24 mg/dL   Calcium 9.1 8.9 - 10.3 mg/dL   Total Protein 8.2 (H) 6.5 - 8.1 g/dL   Albumin 4.6 3.5 - 5.0 g/dL   AST 28 15 - 41 U/L   ALT 37 17 - 63 U/L  Alkaline Phosphatase 71 38 - 126 U/L   Total Bilirubin 0.2 (L) 0.3 - 1.2 mg/dL   GFR calc non Af Amer >60 >60 mL/min   GFR calc Af Amer >60 >60 mL/min    Comment: (NOTE) The eGFR has been calculated using the CKD EPI equation. This calculation has not been validated in all clinical situations. eGFR's persistently <60 mL/min signify possible Chronic Kidney Disease.    Anion gap 14 5 - 15    Comment: Performed at [facility], [place] [address] [place], [place] [address]  Ethanol     Status: Abnormal   Collection Time: 09/12/17  5:29 PM  Result Value Ref Range   Alcohol, Ethyl (B) 373 (HH) <10 mg/dL    Comment:        LOWEST DETECTABLE LIMIT FOR SERUM ALCOHOL IS 10 mg/dL FOR MEDICAL PURPOSES ONLY CRITICAL RESULT CALLED TO, READ BACK BY AND VERIFIED WITH: KELLY B AT 1821 ON 09/12/2017 BY MOSLEY,J Performed at [place] [address], [place], [place] [address]   cbc     Status: None   Collection Time: 09/12/17  5:29 PM  Result Value Ref Range   WBC 7.4 4.0 - 10.5 K/uL   RBC 4.74 4.22 - 5.81 MIL/uL   Hemoglobin 16.0 13.0 - 17.0 g/dL   HCT 46.6 39.0 - 52.0 %   MCV 98.3 78.0 - 100.0 fL   MCH 33.8 26.0 - 34.0 pg   MCHC 34.3 30.0 - 36.0 g/dL   RDW 12.3 11.5 - 15.5 %   Platelets 218 150 - 400 K/uL    Comment: Performed at [facility], [place] [address], [place], [place] [address]  Salicylate level     Status: None   Collection Time: 09/12/17   6:07 PM  Result Value Ref Range   Salicylate Lvl <2.0 2.8 - 30.0 mg/dL    Comment: Performed at [facility], [place] [address] [place], [place] [address]  Acetaminophen level     Status: Abnormal   Collection Time: 09/12/17  6:07 PM  Result Value Ref Range   Acetaminophen (Tylenol), Serum <10 (L) 10 - 30 ug/mL    Comment:        THERAPEUTIC CONCENTRATIONS VARY SIGNIFICANTLY. A RANGE OF 10-30 ug/mL MAY BE AN EFFECTIVE CONCENTRATION FOR MANY PATIENTS. HOWEVER, SOME ARE BEST TREATED AT CONCENTRATIONS OUTSIDE THIS RANGE. ACETAMINOPHEN CONCENTRATIONS >150 ug/mL AT 4 HOURS AFTER INGESTION AND >50 ug/mL AT 12 HOURS AFTER INGESTION ARE OFTEN ASSOCIATED WITH TOXIC REACTIONS. Performed at [facility], [place] [address] [place], [place] [address]     Blood Alcohol level:  Lab Results  Component Value Date   ETH 373 [facility]) 09/12/2017   ETH 246 (H) 45/36/4680    Metabolic Disorder Labs:  No results found for: HGBA1C, MPG No results found for: PROLACTIN Lab Results  Component Value Date   CHOL (H) 06/25/2008    210        ATP III CLASSIFICATION:  <200     mg/dL   Desirable  200-239  mg/dL   Borderline High  >=240    mg/dL   High          TRIG 181 (H) 06/25/2008   HDL 55 06/25/2008   CHOLHDL 3.8 06/25/2008   VLDL 36 06/25/2008   LDLCALC (H) 06/25/2008    119        Total Cholesterol/HDL:CHD Risk Coronary Heart Disease Risk Table  Men   Women  1/2 Average Risk   3.4   3.3  Average Risk       5.0   4.4  2 X Average Risk   9.6   7.1  3 X Average Risk  23.4   11.0        Use the calculated Patient Ratio above and the CHD Risk Table to determine the patient's CHD Risk.        ATP III CLASSIFICATION (LDL):  <100     mg/dL   Optimal  100-129  mg/dL   Near or Above                    Optimal  130-159  mg/dL   Borderline  160-189  mg/dL   High  >190     mg/dL   Very High    Current Medications: Current  Facility-Administered Medications  Medication Dose Route Frequency Provider Last Rate Last Dose  . acetaminophen (TYLENOL) tablet 650 mg  650 mg Oral Q6H PRN Lindon Romp A, NP   650 mg at 09/13/17 1153  . alum & mag hydroxide-simeth (MAALOX/MYLANTA) 200-200-20 MG/5ML suspension 30 mL  30 mL Oral Q4H PRN Lindon Romp A, NP      . hydrOXYzine (ATARAX/VISTARIL) tablet 25 mg  25 mg Oral Q6H PRN Lindon Romp A, NP      . loperamide (IMODIUM) capsule 2-4 mg  2-4 mg Oral PRN Lindon Romp A, NP      . LORazepam (ATIVAN) tablet 1 mg  1 mg Oral Q6H PRN Lindon Romp A, NP      . LORazepam (ATIVAN) tablet 1 mg  1 mg Oral QID Lindon Romp A, NP   1 mg at 09/13/17 1150   Followed by  . [START ON 09/14/2017] LORazepam (ATIVAN) tablet 1 mg  1 mg Oral TID Rozetta Nunnery, NP       Followed by  . [START ON 09/15/2017] LORazepam (ATIVAN) tablet 1 mg  1 mg Oral BID Rozetta Nunnery, NP       Followed by  . [START ON 09/16/2017] LORazepam (ATIVAN) tablet 1 mg  1 mg Oral Daily Lindon Romp A, NP      . magnesium hydroxide (MILK OF MAGNESIA) suspension 30 mL  30 mL Oral Daily PRN Lindon Romp A, NP      . multivitamin with minerals tablet 1 tablet  1 tablet Oral Daily Lindon Romp A, NP   1 tablet at 09/13/17 1151  . ondansetron (ZOFRAN-ODT) disintegrating tablet 4 mg  4 mg Oral Q6H PRN Lindon Romp A, NP      . pantoprazole (PROTONIX) EC tablet 40 mg  40 mg Oral Daily Lindon Romp A, NP   40 mg at 09/13/17 1152  . [START ON 09/14/2017] thiamine (VITAMIN B-1) tablet 100 mg  100 mg Oral Daily Lindon Romp A, NP      . traZODone (DESYREL) tablet 50 mg  50 mg Oral QHS,MR X 1 Lindon Romp A, NP       PTA Medications: Medications Prior to Admission  Medication Sig Dispense Refill Last Dose  . acetaminophen (TYLENOL) 500 MG tablet Take 500 mg by mouth every 6 (six) hours as needed for mild pain.   09/12/2017 at Unknown time  . ibuprofen (ADVIL,MOTRIN) 200 MG tablet Take 200 mg every other day by mouth.   09/11/2017 at Unknown  time  . omeprazole (PRILOSEC) 20 MG capsule Take 20 mg by mouth daily.  09/11/2017 at Unknown time    Musculoskeletal: Strength & Muscle Tone: within normal limits minimal distal tremors, some facial flushing, no psychomotor agitation or restlessness at present. Gait & Station: normal Patient leans: N/A  Psychiatric Specialty Exam: Physical Exam  Review of Systems  Constitutional: Negative.   HENT: Positive for tinnitus.   Eyes: Negative.        Denies visual disturbances   Respiratory: Negative.   Cardiovascular: Negative.   Gastrointestinal: Negative.   Genitourinary: Negative.   Musculoskeletal: Negative.   Skin: Negative.   Neurological: Positive for headaches. Negative for seizures.  Endo/Heme/Allergies: Negative.   Psychiatric/Behavioral: Positive for depression, substance abuse and suicidal ideas. The patient is nervous/anxious.   All other systems reviewed and are negative.   There were no vitals taken for this visit.There is no height or weight on file to calculate BMI.  General Appearance: Fairly Groomed  Eye Contact:  Good  Speech:  Normal Rate  Volume:  Decreased  Mood:  anxious, vaguely depressed  Affect:  anxious  Thought Process:  Linear and Descriptions of Associations: Intact  Orientation:  Full (Time, Place, and Person)- except unable to identify hospital by name.  Thought Content:  no hallucinations, no delusions, not internally preoccupied, focused on stressors.  Suicidal Thoughts:  No currently denies any suicidal or self-injurious ideations.  Contracts for safety on unit.  Homicidal Thoughts:  No  Memory: Recent and remote grossly intact  Judgement:  Fair  Insight:  Fair  Psychomotor Activity:  Mild distal tremors but no overt agitation or restlessness  Concentration:  Concentration: Good and Attention Span: Good  Recall:  Good  Fund of Knowledge:  Good  Language:  Good  Akathisia:  Negative  Handed:  Right  AIMS (if indicated):     Assets:   Desire for Improvement Resilience  ADL's:  Intact  Cognition:  WNL  Sleep:       Treatment Plan Summary: Daily contact with patient to assess and evaluate symptoms and progress in treatment, Medication management, Plan inpatient admission and medications as below   Observation Level/Precautions:  15 minute checks  Laboratory:  as needed   Psychotherapy: Milieu and groups  Medications: We discussed options.  He agrees to an antidepressant trial for mood and anxiety.  Agrees to Zoloft. Start Zoloft 50 mgrs QDAY. Continue Ativan detox protocol to minimize risk of WDL symptoms  Consultations:  I have discussed case with hospitalist consultant.  At this time it is felt that high blood pressure is related primarily to withdrawal.  Recommendation is not to start a standing antihypertensive medication at this time.  Will start as needed medication for elevated blood pressure if needed.  Discharge Concerns:  -  Estimated LOS:5 days   Other:     Physician Treatment Plan for Primary Diagnosis: Alcohol use disorder, alcohol induced mood disorder.  Alcohol induced anxiety versus panic disorder  Long Term Goal(s): Improvement in symptoms so as ready for discharge  Short Term Goals: Ability to identify changes in lifestyle to reduce recurrence of condition will improve and Ability to identify and develop effective coping behaviors will improve  Physician Treatment Plan for Secondary Diagnosis: Alcohol use disorder. Long Term Goal(s): Improvement in symptoms so as ready for discharge  Short Term Goals: Ability to identify triggers associated with substance abuse/mental health issues will improve  I certify that inpatient services furnished can reasonably be expected to improve the patient's condition.    Jenne Campus, MD 3/25/20191:00 PM

## 2017-09-13 NOTE — Progress Notes (Signed)
Pt did not attend group this evening.  

## 2017-09-13 NOTE — [facility] Counselor (Signed)
Pt [facility] been [facility] by Chyrl CivatteJoAnn, [facility] and assigned to room/bed: 302-1. Pt can come after 0800. Updated disposition discussed with CubaHui, Charity fundraiserN. Support paperwork completed and faxed.    Redmond Pullingreylese D Karlene Southard, MS, [facility], [facility] Triage Specialist [phone]

## 2017-09-13 NOTE — Progress Notes (Addendum)
Patient ID: Eddie Torres, male   DOB: 03/09/1961, 57 y.o.   MRN: [id number]  Pt currently presents with a blunted affect and cooperative behavior. Pt reports to writer that their goal is to "get up tomorrow and go to groups." Pt reports good sleep with current medication regimen. Reports ongoing cravings for alcohol. Pt blood pressure remains HTN, trending down since this morning. Currently asymptomatic. Presents with signs and symptoms of withdrawal including fatigue, tremors, and ongoing anxiety.    Pt provided with medications per providers orders. Pt's labs and vitals were monitored throughout the night. Pt given a 1:1 about emotional and mental status. Pt supported and encouraged to express concerns and questions. Pt educated on medications and withdrawal symptoms.   Pt's safety ensured with 15 minute and environmental checks. Pt currently denies SI/HI and A/V hallucinations. Pt verbally agrees to seek staff if SI/HI or A/VH occurs and to consult with staff before acting on any harmful thoughts. Will continue POC.

## 2017-09-13 NOTE — Progress Notes (Signed)
Patient's BP improved after dinner.  BP 139/94 (dinamap)    P96 manually.

## 2017-09-13 NOTE — [facility] Suicide Risk Assessment (Signed)
[facility] INPATIENT:  Family/Significant Other Suicide Prevention Education  Suicide Prevention Education:  Education Completed; Eddie Torres, daughter [phone] has been identified by the patient as the family member/significant other with whom the patient will be residing, and identified as the person(s) who will aid the patient in the event of a mental health crisis (suicidal ideations/suicide attempt).  With written consent from the patient, the family member/significant other has been provided the following suicide prevention education, prior to the and/or following the discharge of the patient.  The suicide prevention education provided includes the following:  Suicide risk factors  Suicide prevention and interventions  National Suicide Hotline telephone number  [facility] assessment telephone number  [facility] Emergency Assistance 911  [facility] and/or Residential Mobile Crisis Unit telephone number  Request made of family/significant other to:  Remove weapons (e.g., guns, rifles, knives), all items previously/currently identified as safety concern.    Remove drugs/medications (over-the-counter, prescriptions, illicit drugs), all items previously/currently identified as a safety concern.  The family member/significant other verbalizes understanding of the suicide prevention education information provided.  The family member/significant other agrees to remove the items of safety concern listed above.  Eddie Torres 09/13/2017, 4:02 PM

## 2017-09-14 DIAGNOSIS — F410 Panic disorder [episodic paroxysmal anxiety] without agoraphobia: Secondary | ICD-10-CM

## 2017-09-14 DIAGNOSIS — Z811 Family history of alcohol abuse and dependence: Secondary | ICD-10-CM

## 2017-09-14 DIAGNOSIS — F1020 Alcohol dependence, uncomplicated: Secondary | ICD-10-CM

## 2017-09-14 LAB — LIPID PANEL
CHOL/HDL RATIO: 3.2 ratio
Cholesterol: 213 mg/dL — ABNORMAL HIGH (ref 0–200)
HDL: 66 mg/dL (ref >40)
LDL CALC: 112 mg/dL — AB (ref 0–99)
TRIGLYCERIDES: 175 mg/dL — AB (ref <150)
VLDL: 35 mg/dL (ref 0–40)

## 2017-09-14 LAB — HEMOGLOBIN A1C
Hgb A1c MFr Bld: 4.6 % — ABNORMAL LOW (ref 4.8–5.6)
MEAN PLASMA GLUCOSE: 85.32 mg/dL

## 2017-09-14 LAB — TSH: TSH: 0.883 u[IU]/mL (ref 0.350–4.500)

## 2017-09-14 MED ORDER — PNEUMOCOCCAL VAC POLYVALENT 25 MCG/0.5ML IJ INJ: 0.5000 mL | INJECTION | INTRAMUSCULAR | Status: DC

## 2017-09-14 MED ORDER — AMLODIPINE BESYLATE 5 MG PO TABS
5.0000 mg | ORAL_TABLET | Freq: Every day | ORAL | Status: DC
Start: 2017-09-14 — End: 2017-09-15
  Administered 2017-09-14 – 2017-09-15 (×2): 5 mg via ORAL
  Filled 2017-09-14 (×4): qty 1

## 2017-09-14 MED ORDER — INFLUENZA VAC SPLIT HIGH-DOSE 0.5 ML IM SUSY
0.5000 mL | PREFILLED_SYRINGE | INTRAMUSCULAR | Status: DC
  Filled 2017-09-14: qty 0.5

## 2017-09-14 NOTE — Progress Notes (Addendum)
[facility]  09/14/2017 3:44 PM Eddie Torres  MRN:  [id number] Subjective:  Patient is seen, examined and key elements reviewed. Patient was discussed with treatment team this am. Patient was admitted on 3/25 with excessive alcohol intake and depression. The patient stated he had been coping with his stress by drinking alcohol. He admitted to depression, anxiety and panic attacks. He has a breathalyzer on his truck and has been unable to afford to get it removed. 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 He denied overt alcohol withdrawal symptoms. His blood pressure has been elevated and he has only received clonidine prn. He denied any suicidal or homicidal ideation. He did admit to anxiety and some depressive symptoms. He remains on a lorazepam taper, CIWA and sertraline for depression and anxiety. Principal Problem: alcohol use disorder; severe, dependence. Unspecified anxiety, panic disorder by report Diagnosis:   Patient Active Problem List   Diagnosis Date Noted  . Severe recurrent major depression without psychotic features (HCC) [F33.2] 09/13/2017   Total Time spent with patient: 15 minutes  Past Psychiatric History: major depression, alcohol use disorder, anxiety, panic disorder. One previous detox  Past Medical History:  Past Medical History:  Diagnosis Date  . Anxiety   . Asthma   . Bipolar disorder (HCC)   . ETOH abuse   . Hypertension    History reviewed. No pertinent surgical history. Family History: History reviewed. No pertinent family history. Family Psychiatric  History: father was an alcoholic by report Social History:  Social History   Substance and Sexual Activity  Alcohol Use Yes  . Alcohol/week: 1.8 oz  . Types: 3 Cans of beer per week   Comment: 3 cans beer every other day     Social History   Substance and Sexual Activity  Drug Use No   Comment: denied all drug use    Social History    Socioeconomic History  . Marital status: Divorced    Spouse name: Not on file  . Number of children: Not on file  . Years of education: Not on file  . Highest education level: Not on file  Occupational History  . Not on file  Social Needs  . Financial resource strain: Not on file  . Food insecurity:    Worry: Not on file    Inability: Not on file  . Transportation needs:    Medical: Not on file    Non-medical: Not on file  Tobacco Use  . Smoking status: Never Smoker  . Smokeless tobacco: Never Used  Substance and Sexual Activity  . Alcohol use: Yes    Alcohol/week: 1.8 oz    Types: 3 Cans of beer per week    Comment: 3 cans beer every other day  . Drug use: No    Comment: denied all drug use  . Sexual activity: Not Currently  Lifestyle  . Physical activity:    Days per week: Not on file    Minutes per session: Not on file  . Stress: Not on file  Relationships  . Social connections:    Talks on phone: Not on file    Gets together: Not on file    Attends religious service: Not on file    Active member of club or organization: Not on file    Attends meetings of clubs or organizations: Not on file    Relationship status: Not on file  Other Topics Concern  . Not on file  Social History Narrative  . Not on file   Additional Social History:    Pain Medications: see MAR Prescriptions: see MAR Over the Counter: see MAR History of alcohol / drug use?: Yes Longest period of sobriety (when/how long): 2 years after birth of daughters Negative Consequences of Use: Financial Withdrawal Symptoms: Tremors, Other (Comment)(anxiety, depression) Name of Substance 1: alcohol 1 - Age of First Use: 57 years old, started drinking beer 1 - Amount (size/oz): 2-3 beers every other day 1 - Frequency: every other day 1 - Duration: drank beer for many years  1 - Last Use / Amount: every other day                  Sleep: Fair  Appetite:  Good  Current  Medications: Current Facility-Administered Medications  Medication Dose Route Frequency Provider Last Rate Last Dose  . acetaminophen (TYLENOL) tablet 650 mg  650 mg Oral Q6H PRN Nira Conn A, NP   650 mg at 09/13/17 1153  . alum & mag hydroxide-simeth (MAALOX/MYLANTA) 200-200-20 MG/5ML suspension 30 mL  30 mL Oral Q4H PRN Nira Conn A, NP      . amLODipine (NORVASC) tablet 5 mg  5 mg Oral Daily Antonieta Pert, MD   5 mg at 09/14/17 1159  . cloNIDine (CATAPRES) tablet 0.1 mg  0.1 mg Oral TID PRN Tyrone Nine, MD   0.1 mg at 09/14/17 0801  . hydrOXYzine (ATARAX/VISTARIL) tablet 25 mg  25 mg Oral Q6H PRN Cobos, Rockey Situ, MD   25 mg at 09/13/17 2119  . [START ON 09/15/2017] Influenza vac split quadrivalent PF (FLUZONE HIGH-DOSE) injection 0.5 mL  0.5 mL Intramuscular Tomorrow-1000 Simon, Spencer E, PA-C      . loperamide (IMODIUM) capsule 2-4 mg  2-4 mg Oral PRN Nira Conn A, NP      . LORazepam (ATIVAN) tablet 1 mg  1 mg Oral Q6H PRN Nira Conn A, NP      . LORazepam (ATIVAN) tablet 1 mg  1 mg Oral TID Nira Conn A, NP   1 mg at 09/14/17 1159   Followed by  . [START ON 09/15/2017] LORazepam (ATIVAN) tablet 1 mg  1 mg Oral BID Jackelyn Poling, NP       Followed by  . [START ON 09/16/2017] LORazepam (ATIVAN) tablet 1 mg  1 mg Oral Daily Nira Conn A, NP      . magnesium hydroxide (MILK OF MAGNESIA) suspension 30 mL  30 mL Oral Daily PRN Nira Conn A, NP      . multivitamin with minerals tablet 1 tablet  1 tablet Oral Daily Nira Conn A, NP   1 tablet at 09/14/17 0802  . ondansetron (ZOFRAN-ODT) disintegrating tablet 4 mg  4 mg Oral Q6H PRN Nira Conn A, NP      . pantoprazole (PROTONIX) EC tablet 40 mg  40 mg Oral Daily Nira Conn A, NP   40 mg at 09/14/17 0801  . [START ON 09/15/2017] pneumococcal 23 valent vaccine (PNU-IMMUNE) injection 0.5 mL  0.5 mL Intramuscular Tomorrow-1000 Simon, Spencer E, PA-C      . sertraline (ZOLOFT) tablet 50 mg  50 mg Oral Daily Cobos, Rockey Situ, MD   50 mg at 09/14/17 0801  . thiamine (VITAMIN B-1) tablet 100 mg  100 mg Oral Daily Nira Conn A, NP   100 mg at 09/14/17 0801  . traZODone (DESYREL) tablet 50 mg  50 mg Oral QHS PRN Cobos, Rockey Situ, MD   50 mg at 09/13/17 2119    Lab Results:  Results for orders placed or performed during the hospital encounter of 09/13/17 (from the past 48 hour(s))  Hemoglobin A1c     Status: Abnormal   Collection Time: 09/14/17  7:27 AM  Result Value Ref Range   Hgb A1c MFr Bld 4.6 (L) 4.8 - 5.6 %    Comment: (NOTE) Pre diabetes:          5.7%-6.4% Diabetes:              >6.4% Glycemic control for   <7.0% adults with diabetes    Mean Plasma Glucose 85.32 mg/dL    Comment: Performed at [facility], [address] [place], [place] [address]  Lipid panel     Status: Abnormal   Collection Time: 09/14/17  7:27 AM  Result Value Ref Range   Cholesterol 213 (H) 0 - 200 mg/dL   Triglycerides 604 (H) <150 mg/dL   HDL 66 >54 mg/dL   Total CHOL/HDL Ratio 3.2 RATIO   VLDL 35 0 - 40 mg/dL   LDL Cholesterol 098 (H) 0 - 99 mg/dL    Comment:        Total Cholesterol/HDL:CHD Risk Coronary Heart Disease Risk Table                     Men   Women  1/2 Average Risk   3.4   3.3  Average Risk       5.0   4.4  2 X Average Risk   9.6   7.1  3 X Average Risk  23.4   11.0        Use the calculated Patient Ratio above and the CHD Risk Table to determine the patient's CHD Risk.        ATP III CLASSIFICATION (LDL):  <100     mg/dL   Optimal  119-147  mg/dL   Near or Above                    Optimal  130-159  mg/dL   Borderline  829-562  mg/dL   High  >130     mg/dL   Very High Performed at [facility], [address], [place], [place] [address]   TSH     Status: None   Collection Time: 09/14/17  7:27 AM  Result Value Ref Range   TSH 0.883 0.350 - 4.500 uIU/mL    Comment: Performed by a 3rd Generation assay with a functional sensitivity of <=0.01 uIU/mL. Performed at  [facility], [address], [place], [place] [address]     Blood Alcohol level:  Lab Results  Component Value Date   ETH 373 [facility]) 09/12/2017   ETH 246 (H) 03/09/2017    Metabolic Disorder Labs: Lab Results  Component Value Date   HGBA1C 4.6 (L) 09/14/2017   MPG 85.32 09/14/2017   No results found for: PROLACTIN Lab Results  Component Value Date   CHOL 213 (H) 09/14/2017   TRIG 175 (H) 09/14/2017   HDL 66 09/14/2017   CHOLHDL 3.2 09/14/2017   VLDL 35 09/14/2017   LDLCALC 112 (H) 09/14/2017   LDLCALC (H) 06/25/2008    119        Total Cholesterol/HDL:CHD Risk Coronary Heart Disease Risk Table  Men   Women  1/2 Average Risk   3.4   3.3  Average Risk       5.0   4.4  2 X Average Risk   9.6   7.1  3 X Average Risk  23.4   11.0        Use the calculated Patient Ratio above and the CHD Risk Table to determine the patient's CHD Risk.        ATP III CLASSIFICATION (LDL):  <100     mg/dL   Optimal  161-096  mg/dL   Near or Above                    Optimal  130-159  mg/dL   Borderline  045-409  mg/dL   High  >811     mg/dL   Very High    Physical Findings: AIMS: Facial and Oral Movements Muscles of Facial Expression: None, normal Lips and Perioral Area: None, normal Jaw: None, normal Tongue: None, normal,Extremity Movements Upper (arms, wrists, hands, fingers): None, normal Lower (legs, knees, ankles, toes): None, normal, Trunk Movements Neck, shoulders, hips: None, normal, Overall Severity Severity of abnormal movements (highest score from questions above): None, normal Incapacitation due to abnormal movements: None, normal Patient's awareness of abnormal movements (rate only patient's report): No Awareness, Dental Status Current problems with teeth and/or dentures?: No Does patient usually wear dentures?: No  CIWA:  CIWA-Ar Total: 1 COWS:  COWS Total Score: 10  Musculoskeletal: Strength & Muscle Tone: within normal  limits Gait & Station: normal Patient leans: N/A  Psychiatric Specialty Exam: Physical Exam  Constitutional: He is oriented to person, place, and time. He appears well-developed and well-nourished.  HENT:  Head: Normocephalic.  Neck: Normal range of motion.  Musculoskeletal: Normal range of motion.  Neurological: He is alert and oriented to person, place, and time.    ROS  Blood pressure 120/86, pulse 89, temperature (!) 97.5 F (36.4 C), temperature source Oral, resp. rate 16, height 5\' 8"  (1.727 m), weight 83.9 kg (185 lb), SpO2 98 %.Body mass index is 28.13 kg/m.  General Appearance: Fairly Groomed  Eye Contact:  Good  Speech:  Normal Rate  Volume:  Normal  Mood:  Euthymic  Affect:  Congruent  Thought Process:  Coherent  Orientation:  Full (Time, Place, and Person)  Thought Content:  Logical  Suicidal Thoughts:  No  Homicidal Thoughts:  No  Memory:  Immediate;   Good  Judgement:  Good  Insight:  Fair  Psychomotor Activity:  Restlessness  Concentration:  Concentration: Fair  Recall:  Fair  Fund of Knowledge:  Fair  Language:  Fair  Akathisia:  No  Handed:  Right  AIMS (if indicated):     Assets:  Communication Skills Desire for Improvement Resilience  ADL's:  Intact  Cognition:  WNL  Sleep:  Number of Hours: 6.5     Treatment Plan Summary: 1) cointnue to monitor for alcohol withdrawal symptoms 2) no change to psychiatric meds at this time 3) add amlodipine 5 mg po q day for HTN and titrate   Daily contact with patient to assess and evaluate symptoms and progress in treatment  Antonieta Pert, MD 09/14/2017, 3:44 PM

## 2017-09-14 NOTE — [facility] Group Notes (Signed)
[facility] Group Notes:  (Nursing/MHT/Case Management/Adjunct)  Date:  09/14/2017  Time:  4:00 pm  Type of Therapy:  Group Therapy  Participation Level:  Did Not Attend  Participation Quality:    Affect:    Cognitive:    Insight:    Engagement in Group:    Modes of Intervention:    Summary of Progress/Problems:  Earline MayotteKnight, Aggie Douse Shephard 09/14/2017, 7:51 PM

## 2017-09-14 NOTE — [facility] Group Notes (Signed)
[facility]      09/14/2017 3:04 PM  Type of Therapy: Mental Health Association Presentation  Participation Level: Active  Participation Quality: Attentive  Affect: Appropriate  Cognitive: Oriented  Insight: Developing/Improving  Engagement in Therapy: Engaged  Modes of Intervention: Discussion, Education and Socialization  Summary of Progress/Problems:  Mental Health Association (MHA) Speaker came to talk about his personal journey with mental health. The pt processed ways by which to relate to the speaker. MHA speaker provided handouts and educational information pertaining to groups and services offered by the [facility]. Pt was engaged in speaker's presentation and was receptive to resources provided.    Alcario DroughtJolan Chiniqua Kilcrease LCSWA Clinical Social Worker

## 2017-09-14 NOTE — Plan of Care (Signed)
Nurse discussed depression, anxiety, coping skills with patient.  

## 2017-09-14 NOTE — Progress Notes (Signed)
D:  Patient's self inventory sheet, patient has poor sleep, sleep medication helpful.  Good appetite, low energy level, good concentration.  Denied depression, hopeless, rated anxiety 5.  Withdrawals, irritability.  Denied SI.  Physical problems, lightheaded, dizziness, headaches, rash.  Denied physical pain.  Goal is "rest"  Plans to try to relax.  Stated he has anxiety, sleep problems.  No discharge plans. A:  Medications administered per MD orders.  Emotional support and encouragement given patient. R:  Patient denied SI and HI, contracts for safety.  Denied A/V hallucinations.  Safety maintained with 15 minute checks.

## 2017-09-14 NOTE — Progress Notes (Signed)
Pt attended morning orientation/goals group and stated that the goal for the day is to speak with his daughters about their family issues and to learn about his discharge plan.

## 2017-09-15 MED ORDER — AMLODIPINE BESYLATE 10 MG PO TABS
10.0000 mg | ORAL_TABLET | Freq: Every day | ORAL | Status: DC
  Administered 2017-09-16: 10 mg via ORAL
  Filled 2017-09-15 (×2): qty 1
  Filled 2017-09-15: qty 7

## 2017-09-15 MED ORDER — TRAZODONE HCL 100 MG PO TABS
100.0000 mg | ORAL_TABLET | Freq: Every evening | ORAL | Status: DC | PRN
  Administered 2017-09-15: 100 mg via ORAL
  Filled 2017-09-15: qty 7
  Filled 2017-09-15: qty 1

## 2017-09-15 NOTE — Tx Team (Signed)
Interdisciplinary Treatment and Diagnostic Plan Update  09/15/2017 Time of Session: 9:05am Eddie Torres MRN: [id number]  Principal Diagnosis: <principal problem not specified>  Secondary Diagnoses: Active Problems:   Severe recurrent major depression without psychotic features (HCC)   Current Medications:  Current Facility-Administered Medications  Medication Dose Route Frequency Provider Last Rate Last Dose  . acetaminophen (TYLENOL) tablet 650 mg  650 mg Oral Q6H PRN Nira Conn A, NP   650 mg at 09/13/17 1153  . alum & mag hydroxide-simeth (MAALOX/MYLANTA) 200-200-20 MG/5ML suspension 30 mL  30 mL Oral Q4H PRN Nira Conn A, NP      . amLODipine (NORVASC) tablet 5 mg  5 mg Oral Daily Antonieta Pert, MD   5 mg at 09/14/17 1159  . cloNIDine (CATAPRES) tablet 0.1 mg  0.1 mg Oral TID PRN Tyrone Nine, MD   0.1 mg at 09/14/17 0801  . hydrOXYzine (ATARAX/VISTARIL) tablet 25 mg  25 mg Oral Q6H PRN Cobos, Rockey Situ, MD   25 mg at 09/13/17 2119  . Influenza vac split quadrivalent PF (FLUZONE HIGH-DOSE) injection 0.5 mL  0.5 mL Intramuscular Tomorrow-1000 Simon, Spencer E, PA-C      . loperamide (IMODIUM) capsule 2-4 mg  2-4 mg Oral PRN Nira Conn A, NP      . LORazepam (ATIVAN) tablet 1 mg  1 mg Oral Q6H PRN Nira Conn A, NP      . LORazepam (ATIVAN) tablet 1 mg  1 mg Oral BID Jackelyn Poling, NP       Followed by  . [START ON 09/16/2017] LORazepam (ATIVAN) tablet 1 mg  1 mg Oral Daily Nira Conn A, NP      . magnesium hydroxide (MILK OF MAGNESIA) suspension 30 mL  30 mL Oral Daily PRN Nira Conn A, NP      . multivitamin with minerals tablet 1 tablet  1 tablet Oral Daily Nira Conn A, NP   1 tablet at 09/14/17 0802  . ondansetron (ZOFRAN-ODT) disintegrating tablet 4 mg  4 mg Oral Q6H PRN Nira Conn A, NP      . pantoprazole (PROTONIX) EC tablet 40 mg  40 mg Oral Daily Nira Conn A, NP   40 mg at 09/14/17 0801  . pneumococcal 23 valent vaccine (PNU-IMMUNE) injection  0.5 mL  0.5 mL Intramuscular Tomorrow-1000 Simon, Spencer E, PA-C      . sertraline (ZOLOFT) tablet 50 mg  50 mg Oral Daily Cobos, Rockey Situ, MD   50 mg at 09/14/17 0801  . thiamine (VITAMIN B-1) tablet 100 mg  100 mg Oral Daily Nira Conn A, NP   100 mg at 09/14/17 0801  . traZODone (DESYREL) tablet 50 mg  50 mg Oral QHS PRN Cobos, Rockey Situ, MD   50 mg at 09/13/17 2119   PTA Medications: Medications Prior to Admission  Medication Sig Dispense Refill Last Dose  . acetaminophen (TYLENOL) 500 MG tablet Take 500 mg by mouth every 6 (six) hours as needed for mild pain.   09/12/2017 at Unknown time  . ibuprofen (ADVIL,MOTRIN) 200 MG tablet Take 200 mg every other day by mouth.   09/11/2017 at Unknown time  . omeprazole (PRILOSEC) 20 MG capsule Take 20 mg by mouth daily.   09/11/2017 at Unknown time    Patient Stressors: Financial difficulties Medication change or noncompliance Occupational concerns Substance abuse  Patient Strengths: Ability for insight Active sense of humor Capable of independent living Communication skills General fund of knowledge Motivation for treatment/growth  Supportive family/friends Work skills  Treatment Modalities: Medication Management, Group therapy, Case management,  1 to 1 session with clinician, Psychoeducation, Recreational therapy.   Physician Treatment Plan for Primary Diagnosis: <principal problem not specified> Long Term Goal(s): Improvement in symptoms so as ready for discharge Improvement in symptoms so as ready for discharge   Short Term Goals: Ability to identify changes in lifestyle to reduce recurrence of condition will improve Ability to identify and develop effective coping behaviors will improve Ability to identify triggers associated with substance abuse/mental health issues will improve  Medication Management: Evaluate patient's response, side effects, and tolerance of medication regimen.  Therapeutic Interventions: 1 to 1  sessions, Unit Group sessions and Medication administration.  Evaluation of Outcomes: Progressing  Physician Treatment Plan for Secondary Diagnosis: Active Problems:   Severe recurrent major depression without psychotic features (HCC)  Long Term Goal(s): Improvement in symptoms so as ready for discharge Improvement in symptoms so as ready for discharge   Short Term Goals: Ability to identify changes in lifestyle to reduce recurrence of condition will improve Ability to identify and develop effective coping behaviors will improve Ability to identify triggers associated with substance abuse/mental health issues will improve     Medication Management: Evaluate patient's response, side effects, and tolerance of medication regimen.  Therapeutic Interventions: 1 to 1 sessions, Unit Group sessions and Medication administration.  Evaluation of Outcomes: Progressing   RN Treatment Plan for Primary Diagnosis: <principal problem not specified> Long Term Goal(s): Knowledge of disease and therapeutic regimen to maintain health will improve  Short Term Goals: Ability to remain free from injury will improve, Ability to verbalize frustration and anger appropriately will improve, Ability to demonstrate self-control, Ability to participate in decision making will improve, Ability to verbalize feelings will improve, Ability to disclose and discuss suicidal ideas, Ability to identify and develop effective coping behaviors will improve and Compliance with prescribed medications will improve  Medication Management: RN will administer medications as ordered by provider, will assess and evaluate patient's response and provide education to patient for prescribed medication. RN will report any adverse and/or side effects to prescribing provider.  Therapeutic Interventions: 1 on 1 counseling sessions, Psychoeducation, Medication administration, Evaluate responses to treatment, Monitor vital signs and CBGs as  ordered, Perform/monitor CIWA, COWS, AIMS and Fall Risk screenings as ordered, Perform wound care treatments as ordered.  Evaluation of Outcomes: Progressing   LCSW Treatment Plan for Primary Diagnosis: <principal problem not specified> Long Term Goal(s): Safe transition to appropriate next level of care at discharge, Engage patient in therapeutic group addressing interpersonal concerns.  Short Term Goals: Engage patient in aftercare planning with referrals and resources, Increase social support, Increase ability to appropriately verbalize feelings, Increase emotional regulation, Facilitate acceptance of mental health diagnosis and concerns, Facilitate patient progression through stages of change regarding substance use diagnoses and concerns, Identify triggers associated with mental health/substance abuse issues and Increase skills for wellness and recovery  Therapeutic Interventions: Assess for all discharge needs, 1 to 1 time with Social worker, Explore available resources and support systems, Assess for adequacy in community support network, Educate family and significant other(s) on suicide prevention, Complete Psychosocial Assessment, Interpersonal group therapy.  Evaluation of Outcomes: Progressing   Progress in Treatment: Attending groups: Yes. Participating in groups: Yes. Taking medication as prescribed: Yes. Toleration medication: Yes. Family/Significant other contact made: Yes, individual(s) contacted:  the patient's daughter, Arty Baumgartneroel Bumgarner  Patient understands diagnosis: Yes. Discussing patient identified problems/goals with staff: Yes. Medical problems stabilized or resolved: Yes.  Denies suicidal/homicidal ideation: Yes. Issues/concerns per patient self-inventory: No. Other:   New problem(s) identified: None   New Short Term/Long Term Goal(s): Detox, medication stabilization, elimination of SI thoughts, development of comprehensive mental wellness plan.   Patient Goals:  "I just want to get my anxiety together so I can get out of here"   Discharge Plan or Barriers: Return home and follow up with [facility] for medication management and therapeutic services.   Reason for Continuation of Hospitalization: Anxiety Depression Medication stabilization Withdrawal symptoms  Estimated Length of Stay: Friday, 09/17/17  Attendees: Patient: Eddie Torres 09/15/2017 8:51 AM  Physician: Dr. Landry Mellow  09/15/2017 8:51 AM  Nursing: Merian Capron, RN 09/15/2017 8:51 AM  RN Care Manager: Juliann Pares 09/15/2017 8:51 AM  Social Worker: Baldo Daub, LCSWA 09/15/2017 8:51 AM  Recreational Therapist: Juliann Pares 09/15/2017 8:51 AM  Other: X 09/15/2017 8:51 AM  Other: X 09/15/2017 8:51 AM  Other: Juliann Pares 09/15/2017 8:51 AM    Scribe for Treatment Team: Maeola Sarah, LCSWA 09/15/2017 8:51 AM

## 2017-09-15 NOTE — Progress Notes (Signed)
Pt attended morning orientation/goals group and stated that his plan is for him to feel better overall and to get back to work.

## 2017-09-15 NOTE — Progress Notes (Signed)
D:Pt is in his room not attending afternoon group. Pt reports that he went to two groups this morning and talked with the MD. He says that he needed to rest this afternoon. Pt rates depression as a 2 and anxiety as a 5 on 0-10 scale with 10 being the most.   A:Offered support, encouragement and 15 minute checks.  R:Pt denies si and hi. Safety maintained on the unit.

## 2017-09-15 NOTE — Progress Notes (Signed)
Recreation Therapy Notes  Date: 3.27.19 Time: 9:30 a.m. Location: [address] Dayroom   Group Topic: Stress Management   Goal Area(s) Addresses:  Goal 1.1: To reduce stress  -Patient will report feeling a reduction in stress level  -Patient will identify the importance of stress management  -Patient will participate during stress management group treatment     Behavioral Response: Engaged   Intervention: Stress Management   Activity: Guided Imagery- Patients were in a peaceful environment with soft lighting enhancing patients mood. Patients were read a guided imagery script to help decrease stress levels   Education: Stress Management, Discharge Planning.    Education Outcome: Acknowledges edcuation/In group clarification offered/Needs additional education   Clinical Observations/Feedback:: Patient attended and participated appropriately during stress management group treatment. Patient reported feeling a reduction in stress level.    Lia Vigilante, Recreation Therapy Intern    Atticus Wedin 09/15/2017 8:23 AM 

## 2017-09-15 NOTE — [facility] Group Notes (Signed)
LCSW Group Therapy Note 09/15/2017 2:17 PM  Type of Therapy/Topic: Group Therapy: Feelings about Diagnosis  Participation Level: Did Not Attend   Description of Group:  This group will allow patients to explore their thoughts and feelings about diagnoses they have received. Patients will be guided to explore their level of understanding and acceptance of these diagnoses. Facilitator will encourage patients to process their thoughts and feelings about the reactions of others to their diagnosis and will guide patients in identifying ways to discuss their diagnosis with significant others in their lives. This group will be process-oriented, with patients participating in exploration of their own experiences, giving and receiving support, and processing challenge from other group members.  Therapeutic Goals: 1. Patient will demonstrate understanding of diagnosis as evidenced by identifying two or more symptoms of the disorder 2. Patient will be able to express two feelings regarding the diagnosis 3. Patient will demonstrate their ability to communicate their needs through discussion and/or role play  Summary of Patient Progress:  Invited, chose not to attend.    Therapeutic Modalities:  Cognitive Behavioral Therapy Brief Therapy Feelings Identification    Eddie Torres LCSWA Clinical Social Worker

## 2017-09-15 NOTE — Progress Notes (Signed)
[facility] MD Progress Note  09/15/2017 3:35 PM Eddie Torres  MRN:  [id number] Subjective: Patient is seen and examined.  Patient is a 57 year old male with a past psychiatric history significant for recurrent major depression and alcohol use disorder.  He is seen in follow-up.  He did not attend groups today.  He did stated he was tired.  He stated he had not slept well last night.  We discussed that and decided to increase his trazodone 100 mg p.o. nightly.  He is very motivated to get back to work.  He feels like if he stays here much longer it will hurt him financially.  He is denied all alcohol withdrawal symptoms, and his blood pressure is stable.  He denies suicidal or homicidal ideation. Principal Problem: <principal problem not specified> Diagnosis:   Patient Active Problem List   Diagnosis Date Noted  . Severe recurrent major depression without psychotic features (HCC) [F33.2] 09/13/2017   Total Time spent with patient: 20 minutes  Past Psychiatric History: Major depression, alcohol use disorder  Past Medical History:  Past Medical History:  Diagnosis Date  . Anxiety   . Asthma   . Bipolar disorder (HCC)   . ETOH abuse   . Hypertension    History reviewed. No pertinent surgical history. Family History: History reviewed. No pertinent family history. Family Psychiatric  History: As dictated previously Social History:  Social History   Substance and Sexual Activity  Alcohol Use Yes  . Alcohol/week: 1.8 oz  . Types: 3 Cans of beer per week   Comment: 3 cans beer every other day     Social History   Substance and Sexual Activity  Drug Use No   Comment: denied all drug use    Social History   Socioeconomic History  . Marital status: Divorced    Spouse name: Not on file  . Number of children: Not on file  . Years of education: Not on file  . Highest education level: Not on file  Occupational History  . Not on file  Social Needs  . Financial resource strain: Not on  file  . Food insecurity:    Worry: Not on file    Inability: Not on file  . Transportation needs:    Medical: Not on file    Non-medical: Not on file  Tobacco Use  . Smoking status: Never Smoker  . Smokeless tobacco: Never Used  Substance and Sexual Activity  . Alcohol use: Yes    Alcohol/week: 1.8 oz    Types: 3 Cans of beer per week    Comment: 3 cans beer every other day  . Drug use: No    Comment: denied all drug use  . Sexual activity: Not Currently  Lifestyle  . Physical activity:    Days per week: Not on file    Minutes per session: Not on file  . Stress: Not on file  Relationships  . Social connections:    Talks on phone: Not on file    Gets together: Not on file    Attends religious service: Not on file    Active member of club or organization: Not on file    Attends meetings of clubs or organizations: Not on file    Relationship status: Not on file  Other Topics Concern  . Not on file  Social History Narrative  . Not on file   Additional Social History:    Pain Medications: see MAR Prescriptions: see MAR Over the Counter: see  MAR History of alcohol / drug use?: Yes Longest period of sobriety (when/how long): 2 years after birth of daughters Negative Consequences of Use: Financial Withdrawal Symptoms: Tremors, Other (Comment)(anxiety, depression) Name of Substance 1: alcohol 1 - Age of First Use: 57 years old, started drinking beer 1 - Amount (size/oz): 2-3 beers every other day 1 - Frequency: every other day 1 - Duration: drank beer for many years  1 - Last Use / Amount: every other day                  Sleep: Fair  Appetite:  Fair  Current Medications: Current Facility-Administered Medications  Medication Dose Route Frequency Provider Last Rate Last Dose  . acetaminophen (TYLENOL) tablet 650 mg  650 mg Oral Q6H PRN Nira Conn A, NP   650 mg at 09/13/17 1153  . alum & mag hydroxide-simeth (MAALOX/MYLANTA) 200-200-20 MG/5ML suspension 30  mL  30 mL Oral Q4H PRN Jackelyn Poling, NP      . Melene Muller ON 09/16/2017] amLODipine (NORVASC) tablet 10 mg  10 mg Oral Daily Antonieta Pert, MD      . cloNIDine (CATAPRES) tablet 0.1 mg  0.1 mg Oral TID PRN Tyrone Nine, MD   0.1 mg at 09/14/17 0801  . hydrOXYzine (ATARAX/VISTARIL) tablet 25 mg  25 mg Oral Q6H PRN Cobos, Rockey Situ, MD   25 mg at 09/13/17 2119  . Influenza vac split quadrivalent PF (FLUZONE HIGH-DOSE) injection 0.5 mL  0.5 mL Intramuscular Tomorrow-1000 Simon, Spencer E, PA-C      . loperamide (IMODIUM) capsule 2-4 mg  2-4 mg Oral PRN Nira Conn A, NP      . LORazepam (ATIVAN) tablet 1 mg  1 mg Oral Q6H PRN Nira Conn A, NP      . LORazepam (ATIVAN) tablet 1 mg  1 mg Oral BID Nira Conn A, NP   1 mg at 09/15/17 0902   Followed by  . [START ON 09/16/2017] LORazepam (ATIVAN) tablet 1 mg  1 mg Oral Daily Nira Conn A, NP      . magnesium hydroxide (MILK OF MAGNESIA) suspension 30 mL  30 mL Oral Daily PRN Nira Conn A, NP      . multivitamin with minerals tablet 1 tablet  1 tablet Oral Daily Nira Conn A, NP   1 tablet at 09/15/17 0902  . ondansetron (ZOFRAN-ODT) disintegrating tablet 4 mg  4 mg Oral Q6H PRN Nira Conn A, NP      . pantoprazole (PROTONIX) EC tablet 40 mg  40 mg Oral Daily Nira Conn A, NP   40 mg at 09/15/17 0902  . pneumococcal 23 valent vaccine (PNU-IMMUNE) injection 0.5 mL  0.5 mL Intramuscular Tomorrow-1000 Simon, Spencer E, PA-C      . sertraline (ZOLOFT) tablet 50 mg  50 mg Oral Daily Cobos, Rockey Situ, MD   50 mg at 09/15/17 0902  . thiamine (VITAMIN B-1) tablet 100 mg  100 mg Oral Daily Nira Conn A, NP   100 mg at 09/15/17 0902  . traZODone (DESYREL) tablet 100 mg  100 mg Oral QHS PRN Antonieta Pert, MD        Lab Results:  Results for orders placed or performed during the hospital encounter of 09/13/17 (from the past 48 hour(s))  Hemoglobin A1c     Status: Abnormal   Collection Time: 09/14/17  7:27 AM  Result Value Ref Range    Hgb A1c MFr Bld 4.6 (L) 4.8 - 5.6 %  Comment: (NOTE) Pre diabetes:          5.7%-6.4% Diabetes:              >6.4% Glycemic control for   <7.0% adults with diabetes    Mean Plasma Glucose 85.32 mg/dL    Comment: Performed at [facility], [address] [place], [place] [address]  Lipid panel     Status: Abnormal   Collection Time: 09/14/17  7:27 AM  Result Value Ref Range   Cholesterol 213 (H) 0 - 200 mg/dL   Triglycerides 914175 (H) <150 mg/dL   HDL 66 >78>40 mg/dL   Total CHOL/HDL Ratio 3.2 RATIO   VLDL 35 0 - 40 mg/dL   LDL Cholesterol 295112 (H) 0 - 99 mg/dL    Comment:        Total Cholesterol/HDL:CHD Risk Coronary Heart Disease Risk Table                     Men   Women  1/2 Average Risk   3.4   3.3  Average Risk       5.0   4.4  2 X Average Risk   9.6   7.1  3 X Average Risk  23.4   11.0        Use the calculated Patient Ratio above and the CHD Risk Table to determine the patient's CHD Risk.        ATP III CLASSIFICATION (LDL):  <100     mg/dL   Optimal  [id number]  mg/dL   Near or Above                    Optimal  130-159  mg/dL   Borderline  [id number]  mg/dL   High  >962>190     mg/dL   Very High Performed at [facility], [address] [place], [place] [address]   TSH     Status: None   Collection Time: 09/14/17  7:27 AM  Result Value Ref Range   TSH 0.883 0.350 - 4.500 uIU/mL    Comment: Performed by a 3rd Generation assay with a functional sensitivity of <=0.01 uIU/mL. Performed at [facility], [address] [place], [place] [address]     Blood Alcohol level:  Lab Results  Component Value Date   ETH 373 [facility](HH) 09/12/2017   ETH 246 (H) 03/09/2017    Metabolic Disorder Labs: Lab Results  Component Value Date   HGBA1C 4.6 (L) 09/14/2017   MPG 85.32 09/14/2017   No results found for: PROLACTIN Lab Results  Component Value Date   CHOL 213 (H) 09/14/2017   TRIG 175 (H) 09/14/2017   HDL 66 09/14/2017   CHOLHDL  3.2 09/14/2017   VLDL 35 09/14/2017   LDLCALC 112 (H) 09/14/2017   LDLCALC (H) 06/25/2008    119        Total Cholesterol/HDL:CHD Risk Coronary Heart Disease Risk Table                     Men   Women  1/2 Average Risk   3.4   3.3  Average Risk       5.0   4.4  2 X Average Risk   9.6   7.1  3 X Average Risk  23.4   11.0        Use the calculated Patient Ratio above and the CHD Risk Table to determine the  patient's CHD Risk.        ATP III CLASSIFICATION (LDL):  <100     mg/dL   Optimal  161-096  mg/dL   Near or Above                    Optimal  130-159  mg/dL   Borderline  045-409  mg/dL   High  >811     mg/dL   Very High    Physical Findings: AIMS: Facial and Oral Movements Muscles of Facial Expression: None, normal Lips and Perioral Area: None, normal Jaw: None, normal Tongue: None, normal,Extremity Movements Upper (arms, wrists, hands, fingers): None, normal Lower (legs, knees, ankles, toes): None, normal, Trunk Movements Neck, shoulders, hips: None, normal, Overall Severity Severity of abnormal movements (highest score from questions above): None, normal Incapacitation due to abnormal movements: None, normal Patient's awareness of abnormal movements (rate only patient's report): No Awareness, Dental Status Current problems with teeth and/or dentures?: No Does patient usually wear dentures?: No  CIWA:  CIWA-Ar Total: 1 COWS:  COWS Total Score: 2  Musculoskeletal: Strength & Muscle Tone: within normal limits Gait & Station: normal Patient leans: N/A  Psychiatric Specialty Exam: Physical Exam  Constitutional: He is oriented to person, place, and time. He appears well-developed and well-nourished.  Musculoskeletal: Normal range of motion.  Neurological: He is alert and oriented to person, place, and time.    ROS  Blood pressure (!) 136/96, pulse 86, temperature 98.4 F (36.9 C), temperature source Oral, resp. rate 16, height 5\' 8"  (1.727 m), weight 83.9 kg (185  lb), SpO2 98 %.Body mass index is 28.13 kg/m.  General Appearance: Casual  Eye Contact:  Fair  Speech:  Clear and Coherent  Volume:  Normal  Mood:  Anxious  Affect:  Appropriate  Thought Process:  Coherent  Orientation:  Full (Time, Place, and Person)  Thought Content:  Negative  Suicidal Thoughts:  No  Homicidal Thoughts:  No  Memory:  Immediate;   Fair  Judgement:  Fair  Insight:  Fair  Psychomotor Activity:  Normal  Concentration:  Concentration: Fair  Recall:  [employer] of Knowledge:  Fair  Language:  Good  Akathisia:  No  Handed:  Right  AIMS (if indicated):     Assets:  Communication Skills Desire for Improvement Talents/Skills  ADL's:  Intact  Cognition:  WNL  Sleep:  Number of Hours: 6     Treatment Plan Summary: Daily contact with patient to assess and evaluate symptoms and progress in treatment, Medication management and Plan Patient is seen and examined.  Patient is a 57 year old male with the above-stated past psychiatric history is seen in follow-up.  His mood seems to be generally improving, and he is denying suicidal or homicidal ideation.  His alcohol withdrawal symptoms are improving, and his scores are showing improvement of that.  His blood pressure is improving on amlodipine.  He did state that his sleep was not as good as it had been, and his trazodone dosage has been increased to 100 mg p.o. nightly.  No other changes in his current medications.  We will plan on discharge tomorrow if everything continues to go well overnight.  Antonieta Pert, MD 09/15/2017, 3:35 PM

## 2017-09-15 NOTE — Progress Notes (Signed)
Pt reports his day has been good.  He denies SI/HI/AVH at this time.  He states his withdrawal symptoms are mild.  He voiced no needs or concerns at the time of the assessment.  He later asked for Ativan at bedtime for sleep.  Writer explained that the Ativan was for strong withdrawal symptoms and he had Vistaril available for mild symptoms.  Writer also reminded pt that his Trazodone had been increased for tonight.  Pt was agreeable to take the Vistaril instead of the Ativan.  He has been appropriate,  pleasant and cooperative with staff.  Support and encouragement offered.  Pt makes his needs known to staff.  Discharge plans are in process.  Pt wants to return home at discharge because of his work.  Safety maintained with q15 minute checks.

## 2017-09-15 NOTE — Progress Notes (Signed)
DAR Note: Patient spent all evening in his room sleeping. Refused to attend group/sit out on dayroom. Patient stated "I am tired. I just want to sleep". Endorses depression but contracts for safety. Denies pain, SI/HI, AH/VH at this time. Appear in no distress. Routine safety checks maintained. Will continue to monitor patient.

## 2017-09-16 MED ORDER — HYDROXYZINE HCL 25 MG PO TABS: 25.0000 mg | ORAL_TABLET | Freq: Four times a day (QID) | ORAL | 0 refills | Status: AC | PRN

## 2017-09-16 MED ORDER — AMLODIPINE BESYLATE 10 MG PO TABS: 10.0000 mg | ORAL_TABLET | Freq: Every day | ORAL | 0 refills | Status: AC

## 2017-09-16 MED ORDER — SERTRALINE HCL 50 MG PO TABS: 50.0000 mg | ORAL_TABLET | Freq: Every day | ORAL | 0 refills | Status: AC

## 2017-09-16 MED ORDER — TRAZODONE HCL 100 MG PO TABS: 100.0000 mg | ORAL_TABLET | Freq: Every evening | ORAL | 0 refills | Status: AC | PRN

## 2017-09-16 NOTE — Discharge Summary (Signed)
Physician Discharge Summary Note  Patient:  Eddie Torres is an 57 y.o., male MRN:  [id number] DOB:  06-Apr-1961 Patient phone:  [phone] (home)  Patient address:   [address] [place] [address],  Total Time spent with patient: 20 minutes  Date of Admission:  09/13/2017 Date of Discharge: 09/17/17   Reason for Admission:  Worsening depression with intoxicated SI  Principal Problem: Severe recurrent major depression without psychotic features [facility]) Discharge Diagnoses: Patient Active Problem List   Diagnosis Date Noted  . Severe recurrent major depression without psychotic features (HCC) [F33.2] 09/13/2017    Past Psychiatric History: He reports a history of depression and anxiety.  He denies any history of suicide attempts and denies any history of self injurious behaviors.  Denies history of mania, denies history of psychosis, denies history of PTSD. Denies history of violence. States that at some point in the past was prescribed Ativan for anxiety but that he has not been on any psychiatric medications for years.  Past Medical History:  Past Medical History:  Diagnosis Date  . Anxiety   . Asthma   . Bipolar disorder (HCC)   . ETOH abuse   . Hypertension    History reviewed. No pertinent surgical history. Family History: History reviewed. No pertinent family history. Family Psychiatric  History: Reports history of alcohol use disorder in the family in (father).  States sibling suffers from anxiety.  No suicides in family  Social History:  Social History   Substance and Sexual Activity  Alcohol Use Yes  . Alcohol/week: 1.8 oz  . Types: 3 Cans of beer per week   Comment: 3 cans beer every other day     Social History   Substance and Sexual Activity  Drug Use No   Comment: denied all drug use    Social History   Socioeconomic History  . Marital status: Divorced    Spouse name: Not on file  . Number of children: Not on file  . Years of education:  Not on file  . Highest education level: Not on file  Occupational History  . Not on file  Social Needs  . Financial resource strain: Not on file  . Food insecurity:    Worry: Not on file    Inability: Not on file  . Transportation needs:    Medical: Not on file    Non-medical: Not on file  Tobacco Use  . Smoking status: Never Smoker  . Smokeless tobacco: Never Used  Substance and Sexual Activity  . Alcohol use: Yes    Alcohol/week: 1.8 oz    Types: 3 Cans of beer per week    Comment: 3 cans beer every other day  . Drug use: No    Comment: denied all drug use  . Sexual activity: Not Currently  Lifestyle  . Physical activity:    Days per week: Not on file    Minutes per session: Not on file  . Stress: Not on file  Relationships  . Social connections:    Talks on phone: Not on file    Gets together: Not on file    Attends religious service: Not on file    Active member of club or organization: Not on file    Attends meetings of clubs or organizations: Not on file    Relationship status: Not on file  Other Topics Concern  . Not on file  Social History Narrative  . Not on file    Hospital Course:  09/13/17 [facility] MD Assessment: 57 year old male. Reports history of alcohol use disorder.  States he normally drinks 4-5 beers per day but acknowledges that amount consumed has increased recently and has been drinking up to 12 beers per day. Patient presented to the emergency room intoxicated,  reporting suicidal ideations, without specific plan. Admission blood alcohol level was 373.  Admission urine drug screen was negative. Currently denies suicidal ideations.  Does state he has been anxious and somewhat depressed recently which he attributes to psychosocial stressors.  He states he does not have a source of income and in order to make money needs to be able to drive. He has an alcohol monitor installed in his vehicle based on history of DUI but states he cannot afford to have it  turned on the result of which is that he has not been able to drive and therefore been unable to obtain employment. He endorses some neurovegetative symptoms as below.  [facility] and stabilized with medication and therapy. Patient was discharged on Vistaril 25 mg Q6H PRN, Zoloft 50 mg Daily, and Trazodone 100 mg QHS PRN. Patient showed improvement with improved mood, affect, sleep, appetite, and interaction. Patient has been attending group and participating. Patient has been seen in the day room interacting with peers and staff appropriately. Patient has denied any SI/HI/AVH and contracts for safety. Patient [facility] agreed [facility]. Patient is provided with prescriptions for his medications upon discharge.    Physical Findings: AIMS: Facial and Oral Movements Muscles of Facial Expression: None, normal Lips and Perioral Area: None, normal Jaw: None, normal Tongue: None, normal,Extremity Movements Upper (arms, wrists, hands, fingers): None, normal Lower (legs, knees, ankles, toes): None, normal, Trunk Movements Neck, shoulders, hips: None, normal, Overall Severity Severity of abnormal movements (highest score from questions above): None, normal Incapacitation due to abnormal movements: None, normal Patient's awareness of abnormal movements (rate only patient's report): No Awareness, Dental Status Current problems with teeth and/or dentures?: No Does patient usually wear dentures?: No  CIWA:  CIWA-Ar Total: 1 COWS:  COWS Total Score: 2  Musculoskeletal: Strength & Muscle Tone: within normal limits Gait & Station: normal Patient leans: N/A  Psychiatric Specialty Exam: Physical Exam  Nursing note and vitals reviewed. Constitutional: He is oriented to person, place, and time. He appears well-developed and well-nourished.  Cardiovascular: Normal rate.  Respiratory: Effort normal.  Musculoskeletal: Normal range of motion.  Neurological: He is alert  and oriented to person, place, and time.  Skin: Skin is warm.    Review of Systems  Constitutional: Negative.   HENT: Negative.   Eyes: Negative.   Respiratory: Negative.   Cardiovascular: Negative.   Gastrointestinal: Negative.   Genitourinary: Negative.   Musculoskeletal: Negative.   Skin: Negative.   Neurological: Negative.   Endo/Heme/Allergies: Negative.   Psychiatric/Behavioral: Negative.     Blood pressure 108/85, pulse 98, temperature 97.8 F (36.6 C), temperature source Oral, resp. rate 18, height 5\' 8"  (1.727 m), weight 83.9 kg (185 lb), SpO2 98 %.Body mass index is 28.13 kg/m.  General Appearance: Casual  Eye Contact:  Good  Speech:  Clear and Coherent and Normal Rate  Volume:  Normal  Mood:  Euthymic  Affect:  Congruent  Thought Process:  Goal Directed and Descriptions of Associations: Intact  Orientation:  Full (Time, Place, and Person)  Thought Content:  WDL  Suicidal Thoughts:  No  Homicidal Thoughts:  No  Memory:  Immediate;   Good Recent;  Good Remote;   Good  Judgement:  Good  Insight:  Good  Psychomotor Activity:  Normal  Concentration:  Concentration: Good and Attention Span: Good  Recall:  Good  Fund of Knowledge:  Good  Language:  Good  Akathisia:  No  Handed:  Right  AIMS (if indicated):     Assets:  Communication Skills Desire for Improvement Financial Resources/Insurance Housing Physical Health Social Support Transportation  ADL's:  Intact  Cognition:  WNL  Sleep:  Number of Hours: 6.75     Have you used any form of tobacco in the last 30 days? (Cigarettes, Smokeless Tobacco, Cigars, and/or Pipes): No  Has this patient used any form of tobacco in the last 30 days? (Cigarettes, Smokeless Tobacco, Cigars, and/or Pipes) Yes, No  Blood Alcohol level:  Lab Results  Component Value Date   ETH 373 (HH) 09/12/2017   ETH 246 (H) 03/09/2017    Metabolic Disorder Labs:  Lab Results  Component Value Date   HGBA1C 4.6 (L) 09/14/2017    MPG 85.32 09/14/2017   No results found for: PROLACTIN Lab Results  Component Value Date   CHOL 213 (H) 09/14/2017   TRIG 175 (H) 09/14/2017   HDL 66 09/14/2017   CHOLHDL 3.2 09/14/2017   VLDL 35 09/14/2017   LDLCALC 112 (H) 09/14/2017   LDLCALC (H) 06/25/2008    119        Total Cholesterol/HDL:CHD Risk Coronary Heart Disease Risk Table                     Men   Women  1/2 Average Risk   3.4   3.3  Average Risk       5.0   4.4  2 X Average Risk   9.6   7.1  3 X Average Risk  23.4   11.0        Use the calculated Patient Ratio above and the CHD Risk Table to determine the patient's CHD Risk.        ATP III CLASSIFICATION (LDL):  <100     mg/dL   Optimal  161-096  mg/dL   Near or Above                    Optimal  130-159  mg/dL   Borderline  045-409  mg/dL   High  >811     mg/dL   Very High    See Psychiatric Specialty Exam and Suicide Risk Assessment completed by Attending Physician prior to discharge.  Discharge destination:  Home  Is patient on multiple antipsychotic therapies at discharge:  No   Has Patient had three or more failed trials of antipsychotic monotherapy by history:  No  Recommended Plan for Multiple Antipsychotic Therapies: NA   Allergies as of 09/16/2017      Reactions   Shellfish Allergy Anaphylaxis      Medication List    STOP taking these medications   acetaminophen 500 MG tablet Commonly known as:  TYLENOL   ibuprofen 200 MG tablet Commonly known as:  ADVIL,MOTRIN     TAKE these medications     Indication  amLODipine 10 MG tablet Commonly known as:  NORVASC Take 1 tablet (10 mg total) by mouth daily. For high blood pressure  Indication:  High Blood Pressure Disorder   hydrOXYzine 25 MG tablet Commonly known as:  ATARAX/VISTARIL Take 1 tablet (25 mg total) by mouth every 6 (six) hours as needed for anxiety.  Indication:  Feeling Anxious   omeprazole 20 MG capsule Commonly known as:  PRILOSEC Take 20 mg by mouth daily.   Indication:  Gastroesophageal Reflux Disease   sertraline 50 MG tablet Commonly known as:  ZOLOFT Take 1 tablet (50 mg total) by mouth daily. For mood control  Indication:  mood stability   traZODone 100 MG tablet Commonly known as:  DESYREL Take 1 tablet (100 mg total) by mouth at bedtime as needed for sleep.  Indication:  Trouble Sleeping      Follow-up Information    [facility] Follow up.   Specialty:  Behavioral Health Why:  Appointment is 09/21/17 at 8:00am. Please be sure to bring your Photo ID, SSN, and insurance card (if you have any).  [address] [place] [address] [phone]           Follow-up recommendations:  Continue activity as tolerated. Continue diet as recommended by your PCP. Ensure to keep all appointments with outpatient providers.  Comments:  Patient is instructed prior to discharge to: Take all medications as prescribed by his/her mental healthcare provider. Report any adverse effects and or reactions from the medicines to his/her outpatient provider promptly. Patient has been instructed & cautioned: To not engage in alcohol and or illegal drug use while on prescription medicines. In the event of worsening symptoms, patient is instructed to call the crisis hotline, 911 and or go to the nearest ED for appropriate evaluation and treatment of symptoms. To follow-up with his/her primary care provider for your other medical issues, concerns and or health care needs.    Signed: Gerlene Burdock Money, FNP 09/17/2017, 9:13 AM

## 2017-09-16 NOTE — Progress Notes (Signed)
  [facility] :  Will you be returning to the same living situation after discharge:  Yes,  home At discharge, do you have transportation home?: Yes,  family member or friend Do you have the ability to pay for your medications: Yes,  mental health  Release of information consent forms completed and submitted to medical records by CSW.  Patient to Follow up at: Follow-up Information    [facility] Follow up.   Specialty:  Behavioral Health Why:  Appointment is 09/21/17 at 8:00am. Please be sure to bring your Photo ID, SSN, and insurance card (if you have any).  Contact information: [address] [place] [address] [phone]           Next level of care provider has access to [facility] Link:no  Safety Planning and Suicide Prevention discussed: Yes,  SPE completed with pt's daughter  Have you used any form of tobacco in the last 30 days? (Cigarettes, Smokeless Tobacco, Cigars, and/or Pipes): No  Has patient been referred to the Quitline?: Patient refused referral  Patient has been referred for addiction treatment: Yes  Pulte HomesHeather N Smart, LCSW 09/16/2017, 10:05 AM

## 2017-09-16 NOTE — Progress Notes (Addendum)
Nursing Progress Note [id number]  Data: Patient presents with pleasant mood and animated affect. Patient took morning medications without incident. Patient states goal for today is to "figure out discharge plan". Patient denies SI/HI/AVH or pain. Patient contracts for safety on the unit at this time. Patient is free of withdrawal symptoms at this time. Patient is observed visible in the milieu and complete his Suicide Safety Plan.  Action: Patient educated about and provided medication per provider's orders. Patient safety maintained with q15 min safety checks. High fall risk precautions in place. Emotional support given. 1:1 interaction and active listening provided. Patient encouraged to attend meals and groups. Labs, vital signs and patient behavior monitored throughout shift. Patient encouraged to work on treatment plan and goals.  Response: Patient receptive to interaction with nurse. Patient remains safe on the unit at this time. Patient is appropriate on the unit. Will continue to support and monitor.

## 2017-09-16 NOTE — [facility] Suicide Risk Assessment (Signed)
[facility]   Principal Problem: <principal problem not specified> Discharge Diagnoses:  Patient Active Problem List   Diagnosis Date Noted  . Severe recurrent major depression without psychotic features (HCC) [F33.2] 09/13/2017    Total Time spent with patient: 30 minutes  Musculoskeletal: Strength & Muscle Tone: within normal limits Gait & Station: normal Patient leans: N/A  Psychiatric Specialty Exam: Review of Systems  Respiratory: Negative for wheezing.   All other systems reviewed and are negative.   Blood pressure 132/85, pulse 88, temperature 98.4 F (36.9 C), temperature source Oral, resp. rate 16, height 5\' 8"  (1.727 m), weight 83.9 kg (185 lb), SpO2 98 %.Body mass index is 28.13 kg/m.  General Appearance: Fairly Groomed  Patent attorneyye Contact::  Good  Speech:  Clear and Coherent409  Volume:  Normal  Mood:  Anxious  Affect:  Appropriate  Thought Process:  Coherent  Orientation:  Full (Time, Place, and Person)  Thought Content:  Negative  Suicidal Thoughts:  No  Homicidal Thoughts:  No  Memory:  Immediate;   Fair  Judgement:  Fair  Insight:  Fair  Psychomotor Activity:  Normal  Concentration:  Fair  Recall:  Good  Fund of Knowledge:Fair  Language: Good  Akathisia:  No  Handed:  Right  AIMS (if indicated):     Assets:  Communication Skills Desire for Improvement Resilience Talents/Skills  Sleep:  Number of Hours: 6.75  Cognition: WNL  ADL's:  Intact   Mental Status Per Nursing Assessment::   On Admission:  NA  Demographic Factors:  Male and Caucasian  Loss Factors: Financial problems/change in socioeconomic status  Historical Factors: Impulsivity  Risk Reduction Factors:   Employed, Positive social support and Positive coping skills or problem solving skills  Continued Clinical Symptoms:  Depression:   Comorbid alcohol abuse/dependence  Cognitive Features That Contribute To Risk:  None    Suicide Risk:  Minimal: No  identifiable suicidal ideation.  Patients presenting with no risk factors but with morbid ruminations; may be classified as minimal risk based on the severity of the depressive symptoms  Follow-up Information    [facility] Follow up.   Specialty:  Behavioral Health Why:  Appointment is 09/21/17 at 8:00am. Please be sure to bring your Photo ID, SSN, and insurance card (if you have any).  Contact information: [address] [place] [address] [phone]           Plan Of Care/Follow-up recommendations:  Activity:  ad lib  Antonieta PertGreg Lawson Bishop Vanderwerf, MD 09/16/2017, 7:37 AM

## 2017-09-16 NOTE — Progress Notes (Signed)
Discharge Note  D) Patient discharged to lobby on 09/16/2017 2:48 PM. Patient states readiness for discharge. Patient denies SI/HI, AVH and is not delusional or psychotic. Patient in no acute distress.  A) Written and verbal discharge instructions given to the patient. Patient accepting and verbalized understanding. Patient agrees to the discharge plan. Patient discharged with follow-up appointment, prescriptions, medication samples and discharge paperwork. All belongings returned to patient. Patient cooperative and pleasant during discharge process. Opportunity for questions and concerns presented to patient. Patient denied any further questions or concerns. Patient signed for return of belongings.   R) Patient safely escorted to the lobby.

## 2019-07-21 ENCOUNTER — Emergency Department (HOSPITAL_COMMUNITY): Payer: Self-pay

## 2019-07-21 ENCOUNTER — Inpatient Hospital Stay (HOSPITAL_COMMUNITY)
Admission: EM | Admit: 2019-07-21 | Discharge: 2019-08-21 | DRG: 963 | Disposition: E | Payer: Self-pay | Attending: Surgery | Admitting: Surgery

## 2019-07-21 ENCOUNTER — Other Ambulatory Visit: Payer: Self-pay

## 2019-07-21 ENCOUNTER — Encounter (HOSPITAL_COMMUNITY): Payer: Self-pay

## 2019-07-21 ENCOUNTER — Inpatient Hospital Stay (HOSPITAL_COMMUNITY): Payer: Self-pay

## 2019-07-21 DIAGNOSIS — R402432 Glasgow coma scale score 3-8, at arrival to emergency department: Secondary | ICD-10-CM | POA: Diagnosis present

## 2019-07-21 DIAGNOSIS — K567 Ileus, unspecified: Secondary | ICD-10-CM | POA: Diagnosis present

## 2019-07-21 DIAGNOSIS — S27329A Contusion of lung, unspecified, initial encounter: Secondary | ICD-10-CM | POA: Diagnosis present

## 2019-07-21 DIAGNOSIS — I959 Hypotension, unspecified: Secondary | ICD-10-CM | POA: Diagnosis not present

## 2019-07-21 DIAGNOSIS — H9190 Unspecified hearing loss, unspecified ear: Secondary | ICD-10-CM | POA: Diagnosis present

## 2019-07-21 DIAGNOSIS — W19XXXA Unspecified fall, initial encounter: Secondary | ICD-10-CM

## 2019-07-21 DIAGNOSIS — Y92009 Unspecified place in unspecified non-institutional (private) residence as the place of occurrence of the external cause: Secondary | ICD-10-CM

## 2019-07-21 DIAGNOSIS — J9690 Respiratory failure, unspecified, unspecified whether with hypoxia or hypercapnia: Secondary | ICD-10-CM

## 2019-07-21 DIAGNOSIS — K920 Hematemesis: Secondary | ICD-10-CM | POA: Diagnosis present

## 2019-07-21 DIAGNOSIS — F319 Bipolar disorder, unspecified: Secondary | ICD-10-CM | POA: Diagnosis present

## 2019-07-21 DIAGNOSIS — S066X9A Traumatic subarachnoid hemorrhage with loss of consciousness of unspecified duration, initial encounter: Principal | ICD-10-CM | POA: Diagnosis present

## 2019-07-21 DIAGNOSIS — I639 Cerebral infarction, unspecified: Secondary | ICD-10-CM

## 2019-07-21 DIAGNOSIS — Z515 Encounter for palliative care: Secondary | ICD-10-CM

## 2019-07-21 DIAGNOSIS — Z9911 Dependence on respirator [ventilator] status: Secondary | ICD-10-CM

## 2019-07-21 DIAGNOSIS — R509 Fever, unspecified: Secondary | ICD-10-CM

## 2019-07-21 DIAGNOSIS — W109XXA Fall (on) (from) unspecified stairs and steps, initial encounter: Secondary | ICD-10-CM | POA: Diagnosis present

## 2019-07-21 DIAGNOSIS — Y908 Blood alcohol level of 240 mg/100 ml or more: Secondary | ICD-10-CM | POA: Diagnosis present

## 2019-07-21 DIAGNOSIS — Z781 Physical restraint status: Secondary | ICD-10-CM

## 2019-07-21 DIAGNOSIS — G9389 Other specified disorders of brain: Secondary | ICD-10-CM | POA: Diagnosis present

## 2019-07-21 DIAGNOSIS — S02119A Unspecified fracture of occiput, initial encounter for closed fracture: Secondary | ICD-10-CM | POA: Diagnosis present

## 2019-07-21 DIAGNOSIS — S0990XA Unspecified injury of head, initial encounter: Secondary | ICD-10-CM

## 2019-07-21 DIAGNOSIS — R739 Hyperglycemia, unspecified: Secondary | ICD-10-CM | POA: Diagnosis not present

## 2019-07-21 DIAGNOSIS — J9601 Acute respiratory failure with hypoxia: Secondary | ICD-10-CM | POA: Diagnosis not present

## 2019-07-21 DIAGNOSIS — S0219XA Other fracture of base of skull, initial encounter for closed fracture: Secondary | ICD-10-CM | POA: Diagnosis present

## 2019-07-21 DIAGNOSIS — S065X9A Traumatic subdural hemorrhage with loss of consciousness of unspecified duration, initial encounter: Secondary | ICD-10-CM | POA: Diagnosis present

## 2019-07-21 DIAGNOSIS — F10129 Alcohol abuse with intoxication, unspecified: Secondary | ICD-10-CM | POA: Diagnosis present

## 2019-07-21 DIAGNOSIS — Z66 Do not resuscitate: Secondary | ICD-10-CM | POA: Diagnosis not present

## 2019-07-21 DIAGNOSIS — I619 Nontraumatic intracerebral hemorrhage, unspecified: Secondary | ICD-10-CM | POA: Diagnosis present

## 2019-07-21 DIAGNOSIS — G8314 Monoplegia of lower limb affecting left nondominant side: Secondary | ICD-10-CM | POA: Diagnosis present

## 2019-07-21 DIAGNOSIS — D696 Thrombocytopenia, unspecified: Secondary | ICD-10-CM | POA: Diagnosis present

## 2019-07-21 DIAGNOSIS — I10 Essential (primary) hypertension: Secondary | ICD-10-CM | POA: Diagnosis present

## 2019-07-21 DIAGNOSIS — G40A01 Absence epileptic syndrome, not intractable, with status epilepticus: Secondary | ICD-10-CM | POA: Diagnosis present

## 2019-07-21 DIAGNOSIS — G9341 Metabolic encephalopathy: Secondary | ICD-10-CM | POA: Diagnosis present

## 2019-07-21 DIAGNOSIS — S0101XA Laceration without foreign body of scalp, initial encounter: Secondary | ICD-10-CM | POA: Diagnosis present

## 2019-07-21 DIAGNOSIS — D649 Anemia, unspecified: Secondary | ICD-10-CM | POA: Diagnosis not present

## 2019-07-21 DIAGNOSIS — E876 Hypokalemia: Secondary | ICD-10-CM | POA: Diagnosis not present

## 2019-07-21 DIAGNOSIS — S2232XA Fracture of one rib, left side, initial encounter for closed fracture: Secondary | ICD-10-CM | POA: Diagnosis present

## 2019-07-21 DIAGNOSIS — Z20822 Contact with and (suspected) exposure to covid-19: Secondary | ICD-10-CM | POA: Diagnosis present

## 2019-07-21 DIAGNOSIS — E781 Pure hyperglyceridemia: Secondary | ICD-10-CM | POA: Diagnosis not present

## 2019-07-21 DIAGNOSIS — Z7189 Other specified counseling: Secondary | ICD-10-CM

## 2019-07-21 DIAGNOSIS — F10139 Alcohol abuse with withdrawal, unspecified: Secondary | ICD-10-CM | POA: Diagnosis not present

## 2019-07-21 LAB — COMPREHENSIVE METABOLIC PANEL
ALT: 58 U/L — ABNORMAL HIGH (ref 0–44)
AST: 49 U/L — ABNORMAL HIGH (ref 15–41)
Albumin: 3.7 g/dL (ref 3.5–5.0)
Alkaline Phosphatase: 77 U/L (ref 38–126)
Anion gap: 15 (ref 5–15)
BUN: 14 mg/dL (ref 6–20)
CO2: 20 mmol/L — ABNORMAL LOW (ref 22–32)
Calcium: 8.3 mg/dL — ABNORMAL LOW (ref 8.9–10.3)
Chloride: 100 mmol/L (ref 98–111)
Creatinine, Ser: 0.94 mg/dL (ref 0.61–1.24)
GFR calc Af Amer: >60 mL/min (ref >60)
GFR calc non Af Amer: >60 mL/min (ref >60)
Glucose, Bld: 148 mg/dL — ABNORMAL HIGH (ref 70–99)
Potassium: 3.5 mmol/L (ref 3.5–5.1)
Sodium: 135 mmol/L (ref 135–145)
Total Bilirubin: 0.6 mg/dL (ref 0.3–1.2)
Total Protein: 6.8 g/dL (ref 6.5–8.1)

## 2019-07-21 LAB — URINALYSIS, ROUTINE W REFLEX MICROSCOPIC
Bacteria, UA: NONE SEEN
Bilirubin Urine: NEGATIVE
Glucose, UA: NEGATIVE mg/dL
Hgb urine dipstick: NEGATIVE
Ketones, ur: 20 mg/dL — AB
Leukocytes,Ua: NEGATIVE
Nitrite: NEGATIVE
Protein, ur: 30 mg/dL — AB
Specific Gravity, Urine: >1.046 — ABNORMAL HIGH (ref 1.005–1.030)
pH: 5 (ref 5.0–8.0)

## 2019-07-21 LAB — CBC
HCT: 44.1 % (ref 39.0–52.0)
Hemoglobin: 15.1 g/dL (ref 13.0–17.0)
MCH: 34.3 pg — ABNORMAL HIGH (ref 26.0–34.0)
MCHC: 34.2 g/dL (ref 30.0–36.0)
MCV: 100.2 fL — ABNORMAL HIGH (ref 80.0–100.0)
Platelets: 169 10*3/uL (ref 150–400)
RBC: 4.4 MIL/uL (ref 4.22–5.81)
RDW: 11.4 % — ABNORMAL LOW (ref 11.5–15.5)
WBC: 8.6 10*3/uL (ref 4.0–10.5)
nRBC: 0 % (ref 0.0–0.2)

## 2019-07-21 LAB — I-STAT CHEM 8, ED
BUN: 15 mg/dL (ref 6–20)
Calcium, Ion: 0.93 mmol/L — ABNORMAL LOW (ref 1.15–1.40)
Chloride: 102 mmol/L (ref 98–111)
Creatinine, Ser: 1.3 mg/dL — ABNORMAL HIGH (ref 0.61–1.24)
Glucose, Bld: 145 mg/dL — ABNORMAL HIGH (ref 70–99)
HCT: 45 % (ref 39.0–52.0)
Hemoglobin: 15.3 g/dL (ref 13.0–17.0)
Potassium: 3.5 mmol/L (ref 3.5–5.1)
Sodium: 136 mmol/L (ref 135–145)
TCO2: 23 mmol/L (ref 22–32)

## 2019-07-21 LAB — POCT I-STAT 7, (LYTES, BLD GAS, ICA,H+H)
Acid-base deficit: 5 mmol/L — ABNORMAL HIGH (ref 0.0–2.0)
Bicarbonate: 22.8 mmol/L (ref 20.0–28.0)
Calcium, Ion: 1.1 mmol/L — ABNORMAL LOW (ref 1.15–1.40)
HCT: 38 % — ABNORMAL LOW (ref 39.0–52.0)
Hemoglobin: 12.9 g/dL — ABNORMAL LOW (ref 13.0–17.0)
O2 Saturation: 100 %
Patient temperature: 96.3
Potassium: 3.6 mmol/L (ref 3.5–5.1)
Sodium: 135 mmol/L (ref 135–145)
TCO2: 24 mmol/L (ref 22–32)
pCO2 arterial: 49.5 mmHg — ABNORMAL HIGH (ref 32.0–48.0)
pH, Arterial: 7.264 — ABNORMAL LOW (ref 7.350–7.450)
pO2, Arterial: 252 mmHg — ABNORMAL HIGH (ref 83.0–108.0)

## 2019-07-21 LAB — PROTIME-INR
INR: 0.9 (ref 0.8–1.2)
Prothrombin Time: 12.1 seconds (ref 11.4–15.2)

## 2019-07-21 LAB — SAMPLE TO BLOOD BANK

## 2019-07-21 LAB — LACTIC ACID, PLASMA: Lactic Acid, Venous: 2.8 mmol/L (ref 0.5–1.9)

## 2019-07-21 LAB — RESPIRATORY PANEL BY RT PCR (FLU A&B, COVID)
Influenza A by PCR: NEGATIVE
Influenza B by PCR: NEGATIVE
SARS Coronavirus 2 by RT PCR: NEGATIVE

## 2019-07-21 LAB — CDS SEROLOGY

## 2019-07-21 LAB — ETHANOL: Alcohol, Ethyl (B): 354 mg/dL (ref <10)

## 2019-07-21 LAB — MRSA PCR SCREENING: MRSA by PCR: NEGATIVE

## 2019-07-21 LAB — GLUCOSE, CAPILLARY
Glucose-Capillary: 148 mg/dL — ABNORMAL HIGH (ref 70–99)
Glucose-Capillary: 174 mg/dL — ABNORMAL HIGH (ref 70–99)

## 2019-07-21 MED ORDER — IOHEXOL 300 MG/ML  SOLN
100.0000 mL | Freq: Once | INTRAMUSCULAR | Status: AC | PRN
  Administered 2019-07-21: 100 mL via INTRAVENOUS

## 2019-07-21 MED ORDER — LACTATED RINGERS IV SOLN
INTRAVENOUS | Status: AC | PRN
  Administered 2019-07-21: 1000 mL/h via INTRAVENOUS

## 2019-07-21 MED ORDER — FENTANYL 2500MCG IN NS 250ML (10MCG/ML) PREMIX INFUSION
50.0000 ug/h | INTRAVENOUS | Status: DC
  Administered 2019-07-21: 19:00:00 50 ug/h via INTRAVENOUS
  Administered 2019-07-22: 06:00:00 200 ug/h via INTRAVENOUS
  Administered 2019-07-23: 04:00:00 100 ug/h via INTRAVENOUS
  Administered 2019-07-24: 18:00:00 50 ug/h via INTRAVENOUS
  Administered 2019-07-26: 100 ug/h via INTRAVENOUS
  Administered 2019-07-27 (×2): 150 ug/h via INTRAVENOUS
  Administered 2019-07-28: 22:00:00 200 ug/h via INTRAVENOUS
  Administered 2019-07-28: 08:00:00 100 ug/h via INTRAVENOUS
  Administered 2019-07-29 (×2): 200 ug/h via INTRAVENOUS
  Filled 2019-07-21 (×10): qty 250

## 2019-07-21 MED ORDER — LEVETIRACETAM IN NACL 1000 MG/100ML IV SOLN
1000.0000 mg | Freq: Once | INTRAVENOUS | Status: AC
  Administered 2019-07-21: 18:00:00 1000 mg via INTRAVENOUS
  Filled 2019-07-21: qty 100

## 2019-07-21 MED ORDER — DOCUSATE SODIUM 100 MG PO CAPS: 100.0000 mg | ORAL_CAPSULE | Freq: Two times a day (BID) | ORAL | Status: DC

## 2019-07-21 MED ORDER — ONDANSETRON HCL 4 MG/2ML IJ SOLN: 4.0000 mg | Freq: Four times a day (QID) | INTRAMUSCULAR | Status: DC | PRN

## 2019-07-21 MED ORDER — SUCCINYLCHOLINE CHLORIDE 20 MG/ML IJ SOLN
INTRAMUSCULAR | Status: AC | PRN
  Administered 2019-07-21: 150 mg via INTRAVENOUS

## 2019-07-21 MED ORDER — CEFAZOLIN SODIUM-DEXTROSE 2-4 GM/100ML-% IV SOLN
2.0000 g | Freq: Once | INTRAVENOUS | Status: AC
  Administered 2019-07-21: 17:00:00 2 g via INTRAVENOUS

## 2019-07-21 MED ORDER — TETANUS-DIPHTH-ACELL PERTUSSIS 5-2.5-18.5 LF-MCG/0.5 IM SUSP
0.5000 mL | Freq: Once | INTRAMUSCULAR | Status: AC
  Administered 2019-07-21: 0.5 mL via INTRAMUSCULAR

## 2019-07-21 MED ORDER — PANTOPRAZOLE SODIUM 40 MG PO TBEC: 40.0000 mg | DELAYED_RELEASE_TABLET | Freq: Every day | ORAL | Status: DC

## 2019-07-21 MED ORDER — FENTANYL CITRATE (PF) 100 MCG/2ML IJ SOLN
INTRAMUSCULAR | Status: AC | PRN
  Administered 2019-07-21: 100 ug via INTRAVENOUS

## 2019-07-21 MED ORDER — FENTANYL CITRATE (PF) 100 MCG/2ML IJ SOLN: 50.0000 ug | INTRAMUSCULAR | Status: DC | PRN

## 2019-07-21 MED ORDER — LEVETIRACETAM IN NACL 500 MG/100ML IV SOLN
500.0000 mg | Freq: Two times a day (BID) | INTRAVENOUS | Status: DC
  Administered 2019-07-22 – 2019-07-23 (×3): 500 mg via INTRAVENOUS
  Filled 2019-07-21 (×4): qty 100

## 2019-07-21 MED ORDER — CHLORHEXIDINE GLUCONATE 0.12% ORAL RINSE (MEDLINE KIT)
15.0000 mL | Freq: Two times a day (BID) | OROMUCOSAL | Status: DC
  Administered 2019-07-21 – 2019-07-29 (×17): 15 mL via OROMUCOSAL

## 2019-07-21 MED ORDER — FENTANYL 2500MCG IN NS 250ML (10MCG/ML) PREMIX INFUSION
0.0000 ug/h | INTRAVENOUS | Status: DC
  Administered 2019-07-21: 100 ug/h via INTRAVENOUS
  Filled 2019-07-21: qty 250

## 2019-07-21 MED ORDER — PROPOFOL 1000 MG/100ML IV EMUL
INTRAVENOUS | Status: AC | PRN
  Administered 2019-07-21: 30 ug/kg/min via INTRAVENOUS

## 2019-07-21 MED ORDER — FENTANYL CITRATE (PF) 100 MCG/2ML IJ SOLN
50.0000 ug | Freq: Once | INTRAMUSCULAR | Status: DC
  Filled 2019-07-21: qty 2

## 2019-07-21 MED ORDER — ONDANSETRON 4 MG PO TBDP: 4.0000 mg | ORAL_TABLET | Freq: Four times a day (QID) | ORAL | Status: DC | PRN

## 2019-07-21 MED ORDER — LABETALOL HCL 5 MG/ML IV SOLN
5.0000 mg | INTRAVENOUS | Status: DC | PRN
  Administered 2019-07-25: 20 mg via INTRAVENOUS
  Administered 2019-07-27 (×2): 10 mg via INTRAVENOUS
  Administered 2019-07-28 (×3): 20 mg via INTRAVENOUS
  Filled 2019-07-21 (×4): qty 4

## 2019-07-21 MED ORDER — SODIUM CHLORIDE 0.9 % IV SOLN: INTRAVENOUS | Status: DC

## 2019-07-21 MED ORDER — VITAL HIGH PROTEIN PO LIQD
1000.0000 mL | ORAL | Status: DC
  Filled 2019-07-21: qty 1000

## 2019-07-21 MED ORDER — FENTANYL CITRATE (PF) 100 MCG/2ML IJ SOLN
INTRAMUSCULAR | Status: AC
  Filled 2019-07-21: qty 2

## 2019-07-21 MED ORDER — PRO-STAT SUGAR FREE PO LIQD: 30.0000 mL | Freq: Two times a day (BID) | ORAL | Status: DC

## 2019-07-21 MED ORDER — FENTANYL BOLUS VIA INFUSION
50.0000 ug | Freq: Once | INTRAVENOUS | Status: AC
  Administered 2019-07-21: 17:00:00 50 ug via INTRAVENOUS
  Filled 2019-07-21: qty 50

## 2019-07-21 MED ORDER — PANTOPRAZOLE SODIUM 40 MG IV SOLR: 40.0000 mg | Freq: Every day | INTRAVENOUS | Status: DC

## 2019-07-21 MED ORDER — CHLORHEXIDINE GLUCONATE CLOTH 2 % EX PADS
6.0000 | MEDICATED_PAD | Freq: Every day | CUTANEOUS | Status: DC
  Administered 2019-07-21: 19:00:00 6 via TOPICAL

## 2019-07-21 MED ORDER — ORAL CARE MOUTH RINSE
15.0000 mL | OROMUCOSAL | Status: DC
  Administered 2019-07-21 – 2019-07-30 (×76): 15 mL via OROMUCOSAL

## 2019-07-21 MED ORDER — ETOMIDATE 2 MG/ML IV SOLN
INTRAVENOUS | Status: AC | PRN
  Administered 2019-07-21: 30 mg via INTRAVENOUS

## 2019-07-21 MED ORDER — HYDRALAZINE HCL 20 MG/ML IJ SOLN
INTRAMUSCULAR | Status: AC
  Filled 2019-07-21: qty 1

## 2019-07-21 MED ORDER — PROPOFOL 1000 MG/100ML IV EMUL
0.0000 ug/kg/min | INTRAVENOUS | Status: DC
  Administered 2019-07-22: 10 ug/kg/min via INTRAVENOUS
  Administered 2019-07-22: 04:00:00 5 ug/kg/min via INTRAVENOUS
  Administered 2019-07-23 (×2): 30 ug/kg/min via INTRAVENOUS
  Filled 2019-07-21 (×6): qty 100

## 2019-07-21 MED ORDER — FENTANYL CITRATE (PF) 100 MCG/2ML IJ SOLN
25.0000 ug | INTRAMUSCULAR | Status: DC | PRN
  Administered 2019-07-24: 50 ug via INTRAVENOUS

## 2019-07-21 MED ORDER — FENTANYL BOLUS VIA INFUSION
50.0000 ug | INTRAVENOUS | Status: DC | PRN
  Administered 2019-07-27: 50 ug via INTRAVENOUS
  Filled 2019-07-21: qty 50

## 2019-07-21 MED ORDER — HYDRALAZINE HCL 20 MG/ML IJ SOLN
5.0000 mg | INTRAMUSCULAR | Status: DC | PRN
  Administered 2019-07-21 – 2019-07-28 (×7): 20 mg via INTRAVENOUS
  Filled 2019-07-21 (×7): qty 1

## 2019-07-21 NOTE — Consult Note (Signed)
MCV 100.2 (H) 80.0 - 100.0 fL   MCH 34.3 (H) 26.0 - 34.0 pg   MCHC 34.2 30.0 - 36.0 g/dL   RDW 11.4 (L) 11.5 - 15.5 %   Platelets 169 150 - 400 K/uL   nRBC 0.0 0.0 - 0.2 %    Comment: Performed at [facility] [address], [place], [place] [address]  Lactic acid, plasma     Status: Abnormal   Collection Time: 19-Aug-2019  3:59 PM  Result Value Ref Range   Lactic Acid, Venous 2.8 (HH) 0.5 - 1.9 mmol/L    Comment: CRITICAL RESULT CALLED TO, READ BACK BY AND VERIFIED WITH: T PHILLIPS,RN 1648 2019-08-19 D BRADLEY Performed at [facility], [place] [address] [place], [place] [address]   Protime-INR     Status: None   Collection Time: 08-19-2019  3:59 PM  Result Value Ref Range   Prothrombin Time 12.1 11.4 - 15.2 seconds   INR 0.9 0.8 - 1.2    Comment: (NOTE) INR goal varies based on device and disease states. Performed at [facility], [place] [address], [place], [place] [address]   Sample to Blood Bank     Status: None   Collection Time: 08/19/19  4:06 PM  Result Value Ref Range     Blood Bank Specimen SAMPLE AVAILABLE FOR TESTING    Sample Expiration      07/22/2019,2359 Performed at [facility], [place] [address] [place], [place] [address]   I-stat chem 8, ED     Status: Abnormal   Collection Time: 19-Aug-2019  4:11 PM  Result Value Ref Range   Sodium 136 135 - 145 mmol/L   Potassium 3.5 3.5 - 5.1 mmol/L   Chloride 102 98 - 111 mmol/L   BUN 15 6 - 20 mg/dL   Creatinine, Ser 1.30 (H) 0.61 - 1.24 mg/dL   Glucose, Bld 145 (H) 70 - 99 mg/dL   Calcium, Ion 0.93 (L) 1.15 - 1.40 mmol/L   TCO2 23 22 - 32 mmol/L   Hemoglobin 15.3 13.0 - 17.0 g/dL   HCT 45.0 39.0 - 52.0 %  I-STAT 7, (LYTES, BLD GAS, ICA, H+H)     Status: Abnormal   Collection Time: August 19, 2019  4:56 PM  Result Value Ref Range   pH, Arterial 7.264 (L) 7.350 - 7.450   pCO2 arterial 49.5 (H) 32.0 - 48.0 mmHg   pO2, Arterial 252.0 (H) 83.0 - 108.0 mmHg   Bicarbonate 22.8 20.0 - 28.0 mmol/L   TCO2 24 22 - 32 mmol/L   O2 Saturation 100.0 %   Acid-base deficit 5.0 (H) 0.0 - 2.0 mmol/L   Sodium 135 135 - 145 mmol/L   Potassium 3.6 3.5 - 5.1 mmol/L   Calcium, Ion 1.10 (L) 1.15 - 1.40 mmol/L   HCT 38.0 (L) 39.0 - 52.0 %   Hemoglobin 12.9 (L) 13.0 - 17.0 g/dL   Patient temperature 96.3 F    Sample type ARTERIAL     DG Pelvis Portable  Result Date: 08/19/19 CLINICAL DATA:  Level 1 trauma. Patient fell down stairs. Unresponsive. EXAM: PORTABLE PELVIS 1-2 VIEWS COMPARISON:  None. FINDINGS: 1606 hours. This portable study excludes the iliac crests and portions of the proximal right femur. The mineralization and alignment are normal. There is no evidence of acute fracture or dislocation. The symphysis pubis and sacroiliac joints appear intact. Various lines project over the pelvis. IMPRESSION: No evidence of acute pelvic injury on this single view.  MCV 100.2 (H) 80.0 - 100.0 fL   MCH 34.3 (H) 26.0 - 34.0 pg   MCHC 34.2 30.0 - 36.0 g/dL   RDW 11.4 (L) 11.5 - 15.5 %   Platelets 169 150 - 400 K/uL   nRBC 0.0 0.0 - 0.2 %    Comment: Performed at [facility] [address], [place], [place] [address]  Lactic acid, plasma     Status: Abnormal   Collection Time: 19-Aug-2019  3:59 PM  Result Value Ref Range   Lactic Acid, Venous 2.8 (HH) 0.5 - 1.9 mmol/L    Comment: CRITICAL RESULT CALLED TO, READ BACK BY AND VERIFIED WITH: T PHILLIPS,RN 1648 2019-08-19 D BRADLEY Performed at [facility], [place] [address] [place], [place] [address]   Protime-INR     Status: None   Collection Time: 08-19-2019  3:59 PM  Result Value Ref Range   Prothrombin Time 12.1 11.4 - 15.2 seconds   INR 0.9 0.8 - 1.2    Comment: (NOTE) INR goal varies based on device and disease states. Performed at [facility], [place] [address], [place], [place] [address]   Sample to Blood Bank     Status: None   Collection Time: 08/19/19  4:06 PM  Result Value Ref Range     Blood Bank Specimen SAMPLE AVAILABLE FOR TESTING    Sample Expiration      07/22/2019,2359 Performed at [facility], [place] [address] [place], [place] [address]   I-stat chem 8, ED     Status: Abnormal   Collection Time: 19-Aug-2019  4:11 PM  Result Value Ref Range   Sodium 136 135 - 145 mmol/L   Potassium 3.5 3.5 - 5.1 mmol/L   Chloride 102 98 - 111 mmol/L   BUN 15 6 - 20 mg/dL   Creatinine, Ser 1.30 (H) 0.61 - 1.24 mg/dL   Glucose, Bld 145 (H) 70 - 99 mg/dL   Calcium, Ion 0.93 (L) 1.15 - 1.40 mmol/L   TCO2 23 22 - 32 mmol/L   Hemoglobin 15.3 13.0 - 17.0 g/dL   HCT 45.0 39.0 - 52.0 %  I-STAT 7, (LYTES, BLD GAS, ICA, H+H)     Status: Abnormal   Collection Time: August 19, 2019  4:56 PM  Result Value Ref Range   pH, Arterial 7.264 (L) 7.350 - 7.450   pCO2 arterial 49.5 (H) 32.0 - 48.0 mmHg   pO2, Arterial 252.0 (H) 83.0 - 108.0 mmHg   Bicarbonate 22.8 20.0 - 28.0 mmol/L   TCO2 24 22 - 32 mmol/L   O2 Saturation 100.0 %   Acid-base deficit 5.0 (H) 0.0 - 2.0 mmol/L   Sodium 135 135 - 145 mmol/L   Potassium 3.6 3.5 - 5.1 mmol/L   Calcium, Ion 1.10 (L) 1.15 - 1.40 mmol/L   HCT 38.0 (L) 39.0 - 52.0 %   Hemoglobin 12.9 (L) 13.0 - 17.0 g/dL   Patient temperature 96.3 F    Sample type ARTERIAL     DG Pelvis Portable  Result Date: 08/19/19 CLINICAL DATA:  Level 1 trauma. Patient fell down stairs. Unresponsive. EXAM: PORTABLE PELVIS 1-2 VIEWS COMPARISON:  None. FINDINGS: 1606 hours. This portable study excludes the iliac crests and portions of the proximal right femur. The mineralization and alignment are normal. There is no evidence of acute fracture or dislocation. The symphysis pubis and sacroiliac joints appear intact. Various lines project over the pelvis. IMPRESSION: No evidence of acute pelvic injury on this single view.  Electronically Signed   By: Carey Bullocks M.D.   On: 06/30/2019 16:23   DG Chest Port 1 View  Result Date: 06/28/2019 CLINICAL DATA:  Level 1 trauma. Patient  fell down stairs. Unresponsive with bleeding from ears. EXAM: PORTABLE CHEST 1 VIEW COMPARISON:  Radiographs 03/09/2017 and 05/22/2015. FINDINGS: 1605 hours. Tip of the endotracheal tube is in the mid trachea. An enteric tube projects below the diaphragm with tip in the left subdiaphragmatic region. The heart size and mediastinal contours are stable without evidence of mediastinal hematoma. Faint asymmetric density projecting over the right upper lobe could reflect mild contusion or atelectasis. The lungs are otherwise clear. There is no pleural effusion or pneumothorax. No acute fractures are seen. IMPRESSION: Satisfactory position of the endotracheal and enteric tubes. Possible mild right upper lobe contusion or atelectasis. Electronically Signed   By: Carey Bullocks M.D.   On: 06/29/2019 16:22   Impression/Plan   59 y.o. male with multiple injuries after unwitnessed fall down approximately 10 steps. He is currently intubated and was just given fentanyl bolus prior to my examination. Prior to intubation, he was purposeful with BUE. No motor response has been witnessed in BLE. etoh [id number] certainly complicating exam. I have reviewed the imaging as it pertains to the brain and spine.  Left subdural hematoma 5-61mm, right frontal hematoma 22mm, frontal hemorrhagic contusion, SAH, parafalcine hemorrhage. No MLS, no hydrocephalus.  - No indication for emergent NS intervention. Will avoid sedating meds and get a true sense for his exam prior to deciding on need for ICP monitoring. Hopefully as he sobers up and sedation wears off, we will get a true sense of his exam. - Keppra 500mg  Bid x7days for seizure prophylaxis - Neuro check q 1 hour, report any change - Repeat head CT in 6 hours for monitoring  Left temporal bone fracture, central skull base fracture. Nondepressed - No evidence of CSF leak on exam - No role for NS intervention   No movement BLE - May need MRI. Will reassess when sedation is held and  as he sobers up.   , PA-C [facility] [place]

## 2019-07-21 NOTE — ED Notes (Signed)
Arty Baumgartner (Daughter#[phone])

## 2019-07-21 NOTE — ED Notes (Signed)
NS PA finished exam

## 2019-07-21 NOTE — ED Notes (Signed)
Neurosurgery PA at the bedside

## 2019-07-21 NOTE — Progress Notes (Signed)
Orthopedic Tech Progress Note Patient Details:  Eddie Torres [birth year] [id number]  Ortho Devices Ortho Device/Splint Location: LEVEL 1 TRAUMA       Saul Fordyce August 03, 2019, 4:24 PM

## 2019-07-21 NOTE — Progress Notes (Signed)
Patient transported from 4N28 to CT and back with no complications. 

## 2019-07-21 NOTE — ED Notes (Signed)
Neurosurgery consult being completed at this time by Dr. Bedelia Person.

## 2019-07-21 NOTE — Progress Notes (Signed)
  NEUROSURGERY PROGRESS NOTE   Re-evaluated 1 hour after initial exam.  Sedation has been off for the past hour.  E: opening eyes spontaneously.  Pupils 57mm reactive. ?following commands with wiggling fingers right hand, although not consistent Does hold right arm flexion at elbow No response LUE/BLE to central pain  A/P: Exam slightly improved. Continue to hold sedating meds as possible. Fentanyl as needed Will continue to allow him to sober up May consider MRI C/T spine pending exam tomorrow Discussed with patient's daughter.

## 2019-07-21 NOTE — ED Provider Notes (Signed)
[facility] EMERGENCY DEPARTMENT Provider Note   CSN: [id number] Arrival date & time: 07/19/2019  1551     History Chief Complaint  Patient presents with   Eddie Torres    Eddie Torres is a 59 y.o. male.  HPI 59 year old male presenting to the emergency department as a level 1 trauma secondary to fall from at the top of 10 steps.  Per report per EMS, patient fell, was found minimally responsive, and transported to the ED for further evaluation.  On arrival to the ED patient was GCS 8, with signs of trauma to the face and head, blood in the ears bilaterally, patient was groaning, moving all extremities however was not protecting airway on arrival, decision was made to intubate.  See procedure note below.  Patient was hypertensive on arrival in the 403K systolic, patient was satting well on a nonrebreather prior intubation, preoxygenated with bag valve mask, on initial RSI, patient had large amount of hematemesis, ET tube was advanced under video laryngoscopy through the vocal cords, good end-tidal CO2 with good oxygen saturation and breath sounds with a confirmatory chest x-ray were performed.  Review of systems limited secondary to patient's acuity, no seizure activity per EMS, no blood thinning medications per EMS.  Patient sustained a large laceration to the posterior head that was stapled in the trauma bay, EMS did state that his left arm was flaccid on arrival and he would not move it.    History reviewed. No pertinent past medical history.  Patient Active Problem List   Diagnosis Date Noted   ICH (intracerebral hemorrhage) ([place]) 06/26/2019    History reviewed. No pertinent surgical history.     No family history on file.  Social History   Tobacco Use   Smoking status: Not on file  Substance Use Topics   Alcohol use: Not on file   Drug use: Not on file    Home Medications Prior to Admission medications   Medication Sig Start Date End Date Taking?  Authorizing Provider  ibuprofen (ADVIL) 200 MG tablet Take 400 mg by mouth every 6 (six) hours as needed (for headaches).   Yes [provider]    Allergies    Shellfish-derived products  Review of Systems   Review of Systems  Unable to perform ROS: Acuity of condition    Physical Exam Updated Vital Signs BP 109/74    Pulse (!) 113    Temp (!) 97.2 F (36.2 C)    Resp (!) 24    Ht _0  (1.676 m)    Wt 92.2 kg    SpO2 96%    BMI 32.81 kg/m   Physical Exam Vitals and nursing note reviewed.  Constitutional:      General: He is in acute distress.     Appearance: He is well-developed. He is ill-appearing.     Comments: GCS 8, ill appearing, trauma to face/posterior head, blood from ears bilaterally, responsive but groaning  HENT:     Head: Normocephalic.     Comments: Large posterior laceration to head Blood in left ear, right ear minimally  Pupils 81m symmetric but minimally reactive  Eyes:     Conjunctiva/sclera: Conjunctivae normal.  Cardiovascular:     Rate and Rhythm: Normal rate and regular rhythm.     Heart sounds: No murmur.  Pulmonary:     Effort: Pulmonary effort is normal. No respiratory distress.     Breath sounds: Normal breath sounds.  Abdominal:     General:  There is distension.     Palpations: Abdomen is soft.     Tenderness: There is no abdominal tenderness.  Musculoskeletal:        General: No swelling, tenderness, deformity or signs of injury.     Cervical back: Neck supple.  Skin:    General: Skin is warm and dry.     Capillary Refill: Capillary refill takes less than 2 seconds.  Neurological:     Comments: GCS 8 Not opening eyes-1 Groaning-2 Localizing-5      ED Results / Procedures / Treatments   Labs (all labs ordered are listed, but only abnormal results are displayed) Labs Reviewed  COMPREHENSIVE METABOLIC PANEL - Abnormal; Notable for the following components:      Result Value   CO2 20 (*)    Glucose, Bld 148 (*)     Calcium 8.3 (*)    AST 49 (*)    ALT 58 (*)    All other components within normal limits  CBC - Abnormal; Notable for the following components:   MCV 100.2 (*)    MCH 34.3 (*)    RDW 11.4 (*)    All other components within normal limits  ETHANOL - Abnormal; Notable for the following components:   Alcohol, Ethyl (B) 354 (*)    All other components within normal limits  URINALYSIS, ROUTINE W REFLEX MICROSCOPIC - Abnormal; Notable for the following components:   Color, Urine STRAW (*)    Specific Gravity, Urine >1.046 (*)    Ketones, ur 20 (*)    Protein, ur 30 (*)    All other components within normal limits  LACTIC ACID, PLASMA - Abnormal; Notable for the following components:   Lactic Acid, Venous 2.8 (*)    All other components within normal limits  GLUCOSE, CAPILLARY - Abnormal; Notable for the following components:   Glucose-Capillary 174 (*)    All other components within normal limits  I-STAT CHEM 8, ED - Abnormal; Notable for the following components:   Creatinine, Ser 1.30 (*)    Glucose, Bld 145 (*)    Calcium, Ion 0.93 (*)    All other components within normal limits  POCT I-STAT 7, (LYTES, BLD GAS, ICA,H+H) - Abnormal; Notable for the following components:   pH, Arterial 7.264 (*)    pCO2 arterial 49.5 (*)    pO2, Arterial 252.0 (*)    Acid-base deficit 5.0 (*)    Calcium, Ion 1.10 (*)    HCT 38.0 (*)    Hemoglobin 12.9 (*)    All other components within normal limits  RESPIRATORY PANEL BY RT PCR (FLU A&B, COVID)  MRSA PCR SCREENING  CDS SEROLOGY  PROTIME-INR  TRIGLYCERIDES  CBC  BASIC METABOLIC PANEL  MAGNESIUM  PHOSPHORUS  BLOOD GAS, ARTERIAL  HIV ANTIBODY (ROUTINE TESTING W REFLEX)  SAMPLE TO BLOOD BANK    EKG None  Radiology CT HEAD WO CONTRAST  Result Date: 06/23/2019 CLINICAL DATA:  59 year old male status post unwitnessed fall down approximately 10 stairs. GCS of 3, left arm flaccid. Laceration to posterior head, blood from left ear. EXAM: CT  HEAD WITHOUT CONTRAST TECHNIQUE: Contiguous axial images were obtained from the base of the skull through the vertex without intravenous contrast. COMPARISON:  Head CT 05/08/2017. FINDINGS: Brain: Scattered small volume pneumocephalus, Including along the anterior and posterior clinoid processes. Broad-based left side subdural hematoma measures 5-6 millimeters in thickness. Superimposed right anterior frontal convexity subdural hematoma measures 5 millimeters in thickness. Superimposed subdural blood along the  falx and left tentorium. Superimposed 2 centimeter hemorrhagic contusion of the right anterior inferior frontal gyrus on series 3, image 21. Superimposed bilateral subarachnoid hemorrhage in the anterior cranial fossa, sylvian fissures right greater than left. There is also a small volume of subarachnoid hemorrhage in the left lateral occipital and posterior temporal sulci. No intraventricular hemorrhage or ventriculomegaly. No midline shift at this time. Basilar cisterns remain patent. No superimposed acute cortically based infarct. Vascular: Calcified atherosclerosis at the skull base. Small volume pneumocephalus Skull: Complex fracture at the left temporal bone (series 4, image 27 with both transverse and longitudinal components. There is associated diastasis of the adjacent suture (petro-squamosal suture) on series 4, image 25, and a linear nondisplaced fracture tracking cephalad along the left lambdoid suture as seen on series 4, image 36. Additionally, a nondisplaced fracture through the left lateral wall of the sphenoid sinus is suspected on series 4, image 27 anterior to the left carotid canal. There is also suspicion of a nondisplaced curvilinear skull fracture through the sphenoid planum on series 4, image 35. Possible also right lateral sphenoid sinus wall fracture on image 30. Sinuses/Orbits: Opacified left middle ear and mastoid air cells likely due to hemorrhage. The left-side ossicles appear to  remain aligned. Superimposed fractures as stated above. Right tympanic cavities and mastoids are clear. Hemorrhage layering in the sphenoid sinuses, and trace blood or fluid scattered in the bilateral ethmoid and maxillary sinuses. Frontal sinuses remain relatively clear. Other: Broad-based left posterior convexity scalp hematoma. Skin staples in place. Orbits soft tissues appear to remain normal. IMPRESSION: 1. Multifocal acute posttraumatic intracranial hemorrhage: - broad-based left subdural hematoma, 5-6 mm. - right anterior frontal convexity subdural hematoma, 5 mm. - 20 mm right anterior frontal lobe hemorrhagic contusion. - scattered bilateral subarachnoid hemorrhage. - para falcine subdural blood. 2. Complex left temporal bone fracture associated with diastasis of the adjacent suture and a linear nondisplaced fracture tracking cephalad along the left lambdoid suture. 3. Subtle nondisplaced central skull base fractures are suspected at the sphenoid planum and lateral walls of both sphenoid sinuses. 4. Associated small volume pneumocephalus. Associated hemorrhage in the sphenoid sinuses, left middle ear and mastoid. 5. No midline shift or ventriculomegaly. Basilar cisterns remain patent at this time. 6. Broad-based left posterior convexity scalp hematoma. Salient findings on this exam were initially reviewed in person with Trauma Surgery Dr. Bobbye Morton On 06/28/2019 at 1639 hours. Electronically Signed   By: Genevie Ann M.D.   On: 07/14/2019 17:10   CT CHEST W CONTRAST  Result Date: 07/05/2019 CLINICAL DATA:  59 year old male status post unwitnessed fall down approximately 10 stairs. GCS of 3, left arm flaccid. Laceration to posterior head, blood from left ear. EXAM: CT CHEST, ABDOMEN, AND PELVIS WITH CONTRAST TECHNIQUE: Multidetector CT imaging of the chest, abdomen and pelvis was performed following the standard protocol during bolus administration of intravenous contrast. CONTRAST:  166m OMNIPAQUE IOHEXOL 300  MG/ML  SOLN COMPARISON:  CT cervical spine today reported separately. CTA chest 09/01/2008. FINDINGS: CT CHEST FINDINGS Cardiovascular: Thoracic aorta appears intact with mild atherosclerosis. No cardiomegaly or pericardial effusion. Other central mediastinal vascular structures appear patent and intact. Calcified carotid artery atherosclerosis on series 3, image 29. Mediastinum/Nodes: Negative. No mediastinal hematoma or lymphadenopathy. Enteric tube courses through the esophagus to the stomach. Lungs/Pleura: There is dependent and peribronchial patchy opacity in the right upper lobe. By report, the patient may have aspirated during intubation. Mild dependent opacity in the left upper lobe. Scattered additional subtle peribronchial ground-glass opacity  in the right lung. No pneumothorax. No pleural effusion there is a small volume of pleural hematoma and/or adjacent pulmonary contusion in the left costophrenic angle adjacent to a mildly displaced left posterior 10th rib fracture (series 5, image 122). Musculoskeletal: Acute on chronic mildly displaced posterolateral left 10th rib fracture. Nearby chronic left 9th rib fracture. No other acute rib fracture identified. Sternum intact. Thoracic vertebrae and visible shoulder osseous structures appear intact. CT ABDOMEN PELVIS FINDINGS Hepatobiliary: Moderate to severe hepatic steatosis. No superimposed liver injury identified. Negative gallbladder. Pancreas: Negative. 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 No splenic injury or perisplenic fluid. Adrenals/Urinary Tract: Normal adrenal glands. Bilateral renal enhancement is symmetric. Contrast excretion is symmetric and proximal ureters appear normal. No renal injury identified. Unremarkable urinary bladder. Stomach/Bowel: Mild sigmoid diverticulosis. Occasional diverticula in the ascending colon. Otherwise negative large bowel with diminutive or absent appendix. Negative terminal ileum. No dilated  small bowel. Small fat containing umbilical hernia. Enteric tube terminates in the gastric fundus. Stomach and duodenum are within normal limits. No free air, free fluid. Vascular/Lymphatic: Intact abdominal aorta and other major arterial structures with mild calcified atherosclerosis. Portal venous system appears patent. No lymphadenopathy. Reproductive: Negative aside from small fat containing inguinal hernias. Other: No pelvic free fluid. Musculoskeletal: No acute lumbar vertebral fracture. Sacral ala and SI joints appear intact. No pelvis or proximal femur fracture identified. There is some superficial soft tissue contusion along the left lower flank/buttock on series 3, image 96. No other superficial soft tissue injury identified. IMPRESSION: 1. Acute on chronic mildly displaced fracture of the left posterior 10th rib. Small volume adjacent pleural hematoma and/or pulmonary contusion in the left costophrenic angle. But no pneumothorax or pleural effusion. 2. Other pulmonary opacity - mostly dependent in the right lung - is compatible with aspiration. 3. Superficial soft tissue contusion/hematoma over the left lower flank/buttock. No underlying fracture identified. 4. No other acute traumatic injury identified in the chest, abdomen, or pelvis. 5. Moderate to severe hepatic steatosis. This exam was preliminarily reviewed in person with Trauma Surgery Dr. Bobbye Morton on 07/18/2019 at 1646 hours. Electronically Signed   By: Genevie Ann M.D.   On: 07/04/2019 17:30   CT CERVICAL SPINE WO CONTRAST  Result Date: 07/19/2019 CLINICAL DATA:  59 year old male status post unwitnessed fall down approximately 10 stairs. GCS of 3, left arm flaccid. Laceration to posterior head. Blood from left ear. EXAM: CT CERVICAL SPINE WITHOUT CONTRAST TECHNIQUE: Multidetector CT imaging of the cervical spine was performed without intravenous contrast. Multiplanar CT image reconstructions were also generated. COMPARISON:  Head CT today reported  separately. Cervical spine CT 05/22/2015. FINDINGS: Alignment: Mildly improved cervical lordosis since 2016. Cervicothoracic junction alignment is within normal limits. Bilateral posterior element alignment is within normal limits. Skull base and vertebrae: Partially visible left skull base fractures. The occipital condyles remain intact. Normal craniocervical alignment. C1 and C2 appear intact and normally aligned. No cervical vertebral fracture is identified. Soft tissues and spinal canal: No prevertebral fluid or swelling. No visible canal hematoma. Fluid in the pharynx. Visible endotracheal and enteric tubes appear appropriately position. No discrete neck soft tissue injury identified. Disc levels: Advanced lower cervical spine disc, endplate degeneration. Widespread cervical posterior element degeneration. Lucency at the left T1 posterior elements, at the inferior C7-T1 facet joint on sagittal image 39 is increased since 2016, but more resembles a degenerative subchondral cyst than a fracture (coronal image 31). Up to mild degenerative cervical spinal stenosis is possible at C6-C7. Upper chest:  No definite upper thoracic fracture identified, left T1 posterior element lucency described above. Patchy opacity in the upper lungs greater on the right, see comparison chest CT. IMPRESSION: 1.  No acute traumatic injury identified in the cervical spine. 2. Progressed lower cervical and upper thoracic spine degeneration since a 2016 CT. Suspect a degenerative subchondral cyst rather than fracture of the left T1 pars (sagittal image 39). 3. See also CT Chest today are reported separately. Preliminary findings on this exam were initially reviewed in person with Trauma Surgery Dr. Bobbye Morton On 07/01/2019 at 1641 hours. Electronically Signed   By: Genevie Ann M.D.   On: 07/20/2019 17:18   CT ABDOMEN PELVIS W CONTRAST  Result Date: 07/10/2019 CLINICAL DATA:  58 year old male status post unwitnessed fall down approximately 10  stairs. GCS of 3, left arm flaccid. Laceration to posterior head, blood from left ear. EXAM: CT CHEST, ABDOMEN, AND PELVIS WITH CONTRAST TECHNIQUE: Multidetector CT imaging of the chest, abdomen and pelvis was performed following the standard protocol during bolus administration of intravenous contrast. CONTRAST:  152m OMNIPAQUE IOHEXOL 300 MG/ML  SOLN COMPARISON:  CT cervical spine today reported separately. CTA chest 09/01/2008. FINDINGS: CT CHEST FINDINGS Cardiovascular: Thoracic aorta appears intact with mild atherosclerosis. No cardiomegaly or pericardial effusion. Other central mediastinal vascular structures appear patent and intact. Calcified carotid artery atherosclerosis on series 3, image 29. Mediastinum/Nodes: Negative. No mediastinal hematoma or lymphadenopathy. Enteric tube courses through the esophagus to the stomach. Lungs/Pleura: There is dependent and peribronchial patchy opacity in the right upper lobe. By report, the patient may have aspirated during intubation. Mild dependent opacity in the left upper lobe. Scattered additional subtle peribronchial ground-glass opacity in the right lung. No pneumothorax. No pleural effusion there is a small volume of pleural hematoma and/or adjacent pulmonary contusion in the left costophrenic angle adjacent to a mildly displaced left posterior 10th rib fracture (series 5, image 122). Musculoskeletal: Acute on chronic mildly displaced posterolateral left 10th rib fracture. Nearby chronic left 9th rib fracture. No other acute rib fracture identified. Sternum intact. Thoracic vertebrae and visible shoulder osseous structures appear intact. CT ABDOMEN PELVIS FINDINGS Hepatobiliary: Moderate to severe hepatic steatosis. No superimposed liver injury identified. Negative gallbladder. Pancreas: Negative. 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 No splenic injury or perisplenic fluid. Adrenals/Urinary Tract: Normal adrenal glands. Bilateral renal  enhancement is symmetric. Contrast excretion is symmetric and proximal ureters appear normal. No renal injury identified. Unremarkable urinary bladder. Stomach/Bowel: Mild sigmoid diverticulosis. Occasional diverticula in the ascending colon. Otherwise negative large bowel with diminutive or absent appendix. Negative terminal ileum. No dilated small bowel. Small fat containing umbilical hernia. Enteric tube terminates in the gastric fundus. Stomach and duodenum are within normal limits. No free air, free fluid. Vascular/Lymphatic: Intact abdominal aorta and other major arterial structures with mild calcified atherosclerosis. Portal venous system appears patent. No lymphadenopathy. Reproductive: Negative aside from small fat containing inguinal hernias. Other: No pelvic free fluid. Musculoskeletal: No acute lumbar vertebral fracture. Sacral ala and SI joints appear intact. No pelvis or proximal femur fracture identified. There is some superficial soft tissue contusion along the left lower flank/buttock on series 3, image 96. No other superficial soft tissue injury identified. IMPRESSION: 1. Acute on chronic mildly displaced fracture of the left posterior 10th rib. Small volume adjacent pleural hematoma and/or pulmonary contusion in the left costophrenic angle. But no pneumothorax or pleural effusion. 2. Other pulmonary opacity - mostly dependent in the right lung - is compatible with aspiration. 3. Superficial  soft tissue contusion/hematoma over the left lower flank/buttock. No underlying fracture identified. 4. No other acute traumatic injury identified in the chest, abdomen, or pelvis. 5. Moderate to severe hepatic steatosis. This exam was preliminarily reviewed in person with Trauma Surgery Dr. Bobbye Morton on 06/30/2019 at 1646 hours. Electronically Signed   By: Genevie Ann M.D.   On: 07/11/2019 17:30   DG Pelvis Portable  Result Date: 07/17/2019 CLINICAL DATA:  Level 1 trauma. Patient fell down stairs. Unresponsive.  EXAM: PORTABLE PELVIS 1-2 VIEWS COMPARISON:  None. FINDINGS: 1606 hours. This portable study excludes the iliac crests and portions of the proximal right femur. The mineralization and alignment are normal. There is no evidence of acute fracture or dislocation. The symphysis pubis and sacroiliac joints appear intact. Various lines project over the pelvis. IMPRESSION: No evidence of acute pelvic injury on this single view. Electronically Signed   By: Richardean Sale M.D.   On: 07/22/2019 16:23   DG Chest Port 1 View  Result Date: 07/23/2019 CLINICAL DATA:  Level 1 trauma. Patient fell down stairs. Unresponsive with bleeding from ears. EXAM: PORTABLE CHEST 1 VIEW COMPARISON:  Radiographs 03/09/2017 and 05/22/2015. FINDINGS: 1605 hours. Tip of the endotracheal tube is in the mid trachea. An enteric tube projects below the diaphragm with tip in the left subdiaphragmatic region. The heart size and mediastinal contours are stable without evidence of mediastinal hematoma. Faint asymmetric density projecting over the right upper lobe could reflect mild contusion or atelectasis. The lungs are otherwise clear. There is no pleural effusion or pneumothorax. No acute fractures are seen. IMPRESSION: Satisfactory position of the endotracheal and enteric tubes. Possible mild right upper lobe contusion or atelectasis. Electronically Signed   By: Richardean Sale M.D.   On: 07/08/2019 16:22    Procedures Procedure Name: Intubation Date/Time: 07/16/2019 4:20 PM Performed by: Kizzie Fantasia, MD Pre-anesthesia Checklist: Patient identified, Emergency Drugs available, Suction available, Timeout performed and Patient being monitored Oxygen Delivery Method: Non-rebreather mask Preoxygenation: Pre-oxygenation with 100% oxygen Induction Type: Rapid sequence Ventilation: Mask ventilation without difficulty Laryngoscope Size: Mac and 4 Grade View: Grade III Tube size: 7.5 mm Number of attempts: 1 Airway Equipment and Method:  Video-laryngoscopy and Rigid stylet Placement Confirmation: ETT inserted through vocal cords under direct vision,  Positive ETCO2 and CO2 detector Secured at: 25 cm Tube secured with: ETT holder Dental Injury: Bloody posterior oropharynx  Difficulty Due To: Difficulty was anticipated, Difficult Airway- due to reduced neck mobility, Difficult Airway- due to cervical collar, Difficult Airway-due to vocal cord/laryngeal edema and Difficult Airway- due to limited oral opening      (including critical care time)  Medications Ordered in ED Medications  fentaNYL (SUBLIMAZE) 100 MCG/2ML injection (has no administration in time range)  docusate sodium (COLACE) capsule 100 mg (has no administration in time range)  ondansetron (ZOFRAN-ODT) disintegrating tablet 4 mg (has no administration in time range)    Or  ondansetron (ZOFRAN) injection 4 mg (has no administration in time range)  pantoprazole (PROTONIX) EC tablet 40 mg (40 mg Oral Not Given 07/23/2019 1804)    Or  pantoprazole (PROTONIX) injection 40 mg ( Intravenous See Alternative 07/13/2019 1804)  fentaNYL (SUBLIMAZE) injection 50 mcg (has no administration in time range)  fentaNYL 2520mg in NS 2528m(1048mml) infusion-PREMIX (50 mcg/hr Intravenous New Bag/Given 06/29/2019 1913)  fentaNYL (SUBLIMAZE) bolus via infusion 50 mcg (has no administration in time range)  propofol (DIPRIVAN) 1000 MG/100ML infusion (has no administration in time range)  feeding supplement (VITAL HIGH  PROTEIN) liquid 1,000 mL (has no administration in time range)  feeding supplement (PRO-STAT SUGAR FREE 64) liquid 30 mL (has no administration in time range)  0.9 %  sodium chloride infusion ( Intravenous New Bag/Given 07/12/2019 1857)  levETIRAcetam (KEPPRA) IVPB 500 mg/100 mL premix (has no administration in time range)  hydrALAZINE (APRESOLINE) injection 5-20 mg (20 mg Intravenous Given 07/07/2019 1801)  labetalol (NORMODYNE) injection 5-20 mg (has no administration in time  range)  chlorhexidine gluconate (MEDLINE KIT) (PERIDEX) 0.12 % solution 15 mL (15 mLs Mouth Rinse Given 06/24/2019 1947)  MEDLINE mouth rinse (has no administration in time range)  Chlorhexidine Gluconate Cloth 2 % PADS 6 each (6 each Topical Given 07/13/2019 1905)  fentaNYL (SUBLIMAZE) injection 25-200 mcg (has no administration in time range)  lactated ringers infusion ( Intravenous Stopped 07/02/2019 1802)  etomidate (AMIDATE) injection (30 mg Intravenous Given 07/13/2019 1556)  succinylcholine (ANECTINE) injection (150 mg Intravenous Given 07/07/2019 1556)  propofol (DIPRIVAN) 1000 MG/100ML infusion (30 mcg/kg/min  100 kg (Order-Specific) Intravenous New Bag/Given 07/15/2019 1605)  fentaNYL (SUBLIMAZE) injection (100 mcg Intravenous Given 07/11/2019 1605)  Tdap (BOOSTRIX) injection 0.5 mL (0.5 mLs Intramuscular Given 06/26/2019 1634)  ceFAZolin (ANCEF) IVPB 2g/100 mL premix (0 g Intravenous Stopped 06/30/2019 1717)  iohexol (OMNIPAQUE) 300 MG/ML solution 100 mL (100 mLs Intravenous Contrast Given 07/15/2019 1638)  fentaNYL (SUBLIMAZE) bolus via infusion 50 mcg (50 mcg Intravenous Bolus from Bag 07/14/2019 1652)  lactated ringers infusion ( Intravenous Stopped 07/01/2019 1802)  levETIRAcetam (KEPPRA) IVPB 1000 mg/100 mL premix (0 mg Intravenous Stopped 07/08/2019 1815)  hydrALAZINE (APRESOLINE) 20 MG/ML injection (  Duplicate 7/82/95 6213)    ED Course  I have reviewed the triage vital signs and the nursing notes.  Pertinent labs & imaging results that were available during my care of the patient were reviewed by me and considered in my medical decision making (see chart for details).    MDM Rules/Calculators/A&P                      59 year old male presenting to the ED as a level 1 trauma secondary to being found minimally responsive after a fall from 10 steps.  On arrival to the ED patient was GCS of 8, breathing spontaneously but not protecting airway, intermittently groaning, localizing, but not opening his eyes.   Patient was intubated, see procedure note.  Difficult airway, secondary to neck mobility as well as on initial induction patient had large-volume hematemesis.  ET tube was placed securely under video laryngoscopy.  Likely intracranial injury given his presentation, patient was hypertensive on arrival as well.  No signs of traumatic injury to the extremities or chest or abdomen, however patient did have a large laceration that was repaired posteriorly by the trauma team with staples.  CT head revealed a large right-sided frontal intraparenchymal hemorrhage with associated subarachnoid hemorrhage.  Unsure if this happened after the fall or if it was secondary to medical reasons such as AV malformation or hypertensive bleed.  Could be aneurysm as well.  Neurosurgery was consulted for further recommendations as well.  Patient will be admitted to the trauma service.  CT chest with no hemothorax or PNX.   The attending physician was present and available for all medical decision making and procedures related to this patient's care.  Final Clinical Impression(s) / ED Diagnoses Final diagnoses:  Fall, initial encounter  Injury of head, initial encounter    Rx / DC Orders ED Discharge Orders  None       Kizzie Fantasia, MD 06/24/2019 2040    Tegeler, Gwenyth Allegra, MD 07/22/19 [phone]

## 2019-07-21 NOTE — ED Triage Notes (Signed)
Pt bib ems after unwitnessed fall down approx 10 steps. Pt GCS 3. L arm flaccid, uncontrolled lac to posterior head, blood coming from L ear. 196/126, HR 90, 100% 15L  NRB.

## 2019-07-21 NOTE — H&P (Addendum)
Activation and Reason: level 1 trauma - head injury with GCS 3  Primary Survey: airway intact, bagging, bleeding from posterior scalp wound not well controlled  Eddie Torres is an 59 y.o. male.  HPI: Patient is a 59 year old male who was brought in as a level 1 trauma after fall down estimated 10 stairs. Fall was not witnessed but heard by roommates and they found him down at the bottom of the stairs. EMS reported obvious head trauma and GCS of 3. Patient lived in a "home for elder men". Friends were unable to provide any medical history. On arrival patient with GCS7 [phone]). Intubated for airway protection. Moved bilateral UEs with good strength prior to intubation. Stellate L posterior scalp wound ~4-5 cm at longest point. Per patient's other chart, PMH significant for HTN, EtOH abuse, Bipolar disorder. 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 No known blood thinners.   No past medical history on file.  No family history on file.  Social History:  has no history on file for tobacco, alcohol, and drug.  Allergies: Not on File  Medications: reviewed patient's medications in duplicate chart  Results for orders placed or performed during the hospital encounter of Aug 02, 2019 (from the past 48 hour(s))  Comprehensive metabolic panel     Status: Abnormal   Collection Time: August 02, 2019  3:59 PM  Result Value Ref Range   Sodium 135 135 - 145 mmol/L   Potassium 3.5 3.5 - 5.1 mmol/L   Chloride 100 98 - 111 mmol/L   CO2 20 (L) 22 - 32 mmol/L   Glucose, Bld 148 (H) 70 - 99 mg/dL   BUN 14 6 - 20 mg/dL   Creatinine, Ser 0.94 0.61 - 1.24 mg/dL   Calcium 8.3 (L) 8.9 - 10.3 mg/dL   Total Protein 6.8 6.5 - 8.1 g/dL   Albumin 3.7 3.5 - 5.0 g/dL   AST 49 (H) 15 - 41 U/L   ALT 58 (H) 0 - 44 U/L   Alkaline Phosphatase 77 38 - 126 U/L   Total Bilirubin 0.6 0.3 - 1.2 mg/dL   GFR calc non Af Amer >60 >60 mL/min   GFR calc Af Amer >60 >60 mL/min   Anion gap 15 5 - 15    Comment: Performed  at [facility], [address] [place], [place] [address]  CBC     Status: Abnormal   Collection Time: 08-02-2019  3:59 PM  Result Value Ref Range   WBC 8.6 4.0 - 10.5 K/uL   RBC 4.40 4.22 - 5.81 MIL/uL   Hemoglobin 15.1 13.0 - 17.0 g/dL   HCT 44.1 39.0 - 52.0 %   MCV 100.2 (H) 80.0 - 100.0 fL   MCH 34.3 (H) 26.0 - 34.0 pg   MCHC 34.2 30.0 - 36.0 g/dL   RDW 11.4 (L) 11.5 - 15.5 %   Platelets 169 150 - 400 K/uL   nRBC 0.0 0.0 - 0.2 %    Comment: Performed at [facility], [place] [address] [place],  [address]  Ethanol     Status: Abnormal   Collection Time: 02-Aug-2019  3:59 PM  Result Value Ref Range   Alcohol, Ethyl (B) 354 (HH) <10 mg/dL    Comment: CRITICAL RESULT CALLED TO, READ BACK BY AND VERIFIED WITH: C BAIN,RN 1701 2019/08/02 D BRADLEY (NOTE) Lowest detectable limit for serum alcohol is 10 mg/dL. For medical purposes only. Performed at [facility], [place] [address]  [place], [place] [address]   Lactic acid, plasma     Status: Abnormal   Collection Time: 08-19-2019  3:59 PM  Result Value Ref Range   Lactic Acid, Venous 2.8 (HH) 0.5 - 1.9 mmol/L    Comment: CRITICAL RESULT CALLED TO, READ BACK BY AND VERIFIED WITH: T PHILLIPS,RN 1648 08/19/2019 D BRADLEY Performed at [facility], [address] [place], [place] [address]   Protime-INR     Status: None   Collection Time: 08/19/2019  3:59 PM  Result Value Ref Range   Prothrombin Time 12.1 11.4 - 15.2 seconds   INR 0.9 0.8 - 1.2    Comment: (NOTE) INR goal varies based on device and disease states. Performed at [facility], [address] [place], [place] [address]   Sample to Blood Bank     Status: None   Collection Time: 08-19-19  4:06 PM  Result Value Ref Range   Blood Bank Specimen SAMPLE AVAILABLE FOR TESTING    Sample Expiration      07/22/2019,2359 Performed at [facility], [address] [place], [place] [address]   I-stat chem 8, ED     Status: Abnormal    Collection Time: 08/19/19  4:11 PM  Result Value Ref Range   Sodium 136 135 - 145 mmol/L   Potassium 3.5 3.5 - 5.1 mmol/L   Chloride 102 98 - 111 mmol/L   BUN 15 6 - 20 mg/dL   Creatinine, Ser 0.35 (H) 0.61 - 1.24 mg/dL   Glucose, Bld 465 (H) 70 - 99 mg/dL   Calcium, Ion 6.81 (L) 1.15 - 1.40 mmol/L   TCO2 23 22 - 32 mmol/L   Hemoglobin 15.3 13.0 - 17.0 g/dL   HCT 27.5 17.0 - 01.7 %  I-STAT 7, (LYTES, BLD GAS, ICA, H+H)     Status: Abnormal   Collection Time: 08/19/19  4:56 PM  Result Value Ref Range   pH, Arterial 7.264 (L) 7.350 - 7.450   pCO2 arterial 49.5 (H) 32.0 - 48.0 mmHg   pO2, Arterial 252.0 (H) 83.0 - 108.0 mmHg   Bicarbonate 22.8 20.0 - 28.0 mmol/L   TCO2 24 22 - 32 mmol/L   O2 Saturation 100.0 %   Acid-base deficit 5.0 (H) 0.0 - 2.0 mmol/L   Sodium 135 135 - 145 mmol/L   Potassium 3.6 3.5 - 5.1 mmol/L   Calcium, Ion 1.10 (L) 1.15 - 1.40 mmol/L   HCT 38.0 (L) 39.0 - 52.0 %   Hemoglobin 12.9 (L) 13.0 - 17.0 g/dL   Patient temperature 49.4 F    Sample type ARTERIAL     DG Pelvis Portable  Result Date: August 19, 2019 CLINICAL DATA:  Level 1 trauma. Patient fell down stairs. Unresponsive. EXAM: PORTABLE PELVIS 1-2 VIEWS COMPARISON:  None. FINDINGS: 1606 hours. This portable study excludes the iliac crests and portions of the proximal right femur. The mineralization and alignment are normal. There is no evidence of acute fracture or dislocation. The symphysis pubis and sacroiliac joints appear intact. Various lines project over the pelvis. IMPRESSION: No evidence of acute pelvic injury on this single view. Electronically Signed   By: Carey Bullocks M.D.   On: 08/19/19 16:23   DG Chest Port 1 View  Result Date: 2019-08-19 CLINICAL DATA:  Level 1 trauma. Patient fell down stairs. Unresponsive with bleeding from ears. EXAM: PORTABLE CHEST 1 VIEW COMPARISON:  Radiographs 03/09/2017 and 05/22/2015. FINDINGS: 1605 hours. Tip of the endotracheal tube is in the mid  trachea. An  enteric tube projects below the diaphragm with tip in the left subdiaphragmatic region. The heart size and mediastinal contours are stable without evidence of mediastinal hematoma. Faint asymmetric density projecting over the right upper lobe could reflect mild contusion or atelectasis. The lungs are otherwise clear. There is no pleural effusion or pneumothorax. No acute fractures are seen. IMPRESSION: Satisfactory position of the endotracheal and enteric tubes. Possible mild right upper lobe contusion or atelectasis. Electronically Signed   By: Carey Bullocks M.D.   On: 06/29/2019 16:22    Review of Systems  Unable to perform ROS: Patient unresponsive   Blood pressure (!) 162/124, pulse 96, temperature (!) 96.3 F (35.7 C), resp. rate 16, height 5\' 6"  (1.676 m), weight 100 kg, SpO2 100 %. Physical Exam  Constitutional: He appears well-developed and well-nourished. He is intubated. Cervical collar and backboard in place.  HENT:  Head: Head is with laceration. Head is without raccoon's eyes and without Battle's sign.    Right Ear: Tympanic membrane, external ear and ear canal normal.  Left Ear: There is drainage (blood in L canal, TM not visualized).  Nose: Nose normal. No nasal septal hematoma.  Mouth/Throat: Mucous membranes are normal.  Eyes: Pupils are equal, round, and reactive to light. Conjunctivae and lids are normal. No scleral icterus.  Neck: No tracheal deviation present.  Cardiovascular: Normal rate, regular rhythm, normal heart sounds and intact distal pulses.  Pulses:      Radial pulses are 2+ on the right side and 2+ on the left side.       Femoral pulses are 2+ on the right side and 2+ on the left side.      Dorsalis pedis pulses are 2+ on the right side and 2+ on the left side.  Respiratory: He is intubated. He has decreased breath sounds (bilaterally ).  Patient aspirated bilious material on induction   GI: Soft. Bowel sounds are normal. He exhibits no distension. There  is no hepatosplenomegaly. There is no rigidity and no guarding. A hernia (small reducible umbilical hernia ) is present.  Appendectomy scar  Genitourinary:    Testes and penis normal.  Circumcised.  Musculoskeletal:     Cervical back: Neck supple.     Comments: No obvious deformity of bilateral upper and lower extremities. No lacerations to extremities or trunk. No step-offs noted over cervical/thoracic/lumbar spine. Ecchymosis of L hip  Neurological: He is unresponsive. GCS eye subscore is 1. GCS verbal subscore is 1. GCS motor subscore is 5.  Moved bilateral UEs with good strength, no movement of bilateral LEs  Skin: Skin is warm and dry.  Psychiatric:  Not able to be assessed      Assessment/Plan: Fall down 10 stairs BL SDH/SAH, Large R frontal contusion, Tentorial and falcine SDH- NS consulted, repeat head CT in 6 hrs L occipital skull fx, L temporal bone fx with blood in L middle ear - ENT consult pending Pneumocephalus  - abx Scalp laceration - stapled in ED Possible T1 fracture - keep in collar for now, per NS L 10th rib fracture, old rib fractures L 9-10 - pain control, IS, pulm toilet Aspiration  HTN EtOH abuse - CIWA, 354 on admit Bipolar disorder  Admit to Neuro ICU. NS and ENT consulting. COVID test is negative.   Kelly Rayburn 07/07/2019, 5:10 PM    Intubated for airway protection in ED, GCS8. CT head with multiple ICH identified and NSGY consulted. Loaded with 1g keppra and started on 500  Q12. Open skull frx, rec'd ancef/tetanus in TB. Admitted to ICU. Critical care time .  Diamantina Monks, MD General and Trauma Surgery [facility]

## 2019-07-22 ENCOUNTER — Inpatient Hospital Stay (HOSPITAL_COMMUNITY): Payer: Self-pay

## 2019-07-22 LAB — CBC
HCT: 35.4 % — ABNORMAL LOW (ref 39.0–52.0)
Hemoglobin: 12.1 g/dL — ABNORMAL LOW (ref 13.0–17.0)
MCH: 34.1 pg — ABNORMAL HIGH (ref 26.0–34.0)
MCHC: 34.2 g/dL (ref 30.0–36.0)
MCV: 99.7 fL (ref 80.0–100.0)
Platelets: 155 10*3/uL (ref 150–400)
RBC: 3.55 MIL/uL — ABNORMAL LOW (ref 4.22–5.81)
RDW: 11.8 % (ref 11.5–15.5)
WBC: 16.8 10*3/uL — ABNORMAL HIGH (ref 4.0–10.5)
nRBC: 0 % (ref 0.0–0.2)

## 2019-07-22 LAB — GLUCOSE, CAPILLARY
Glucose-Capillary: 134 mg/dL — ABNORMAL HIGH (ref 70–99)
Glucose-Capillary: 135 mg/dL — ABNORMAL HIGH (ref 70–99)
Glucose-Capillary: 137 mg/dL — ABNORMAL HIGH (ref 70–99)
Glucose-Capillary: 139 mg/dL — ABNORMAL HIGH (ref 70–99)
Glucose-Capillary: 155 mg/dL — ABNORMAL HIGH (ref 70–99)
Glucose-Capillary: 158 mg/dL — ABNORMAL HIGH (ref 70–99)

## 2019-07-22 LAB — BASIC METABOLIC PANEL
Anion gap: 11 (ref 5–15)
BUN: 11 mg/dL (ref 6–20)
CO2: 24 mmol/L (ref 22–32)
Calcium: 7.7 mg/dL — ABNORMAL LOW (ref 8.9–10.3)
Chloride: 101 mmol/L (ref 98–111)
Creatinine, Ser: 0.81 mg/dL (ref 0.61–1.24)
GFR calc Af Amer: >60 mL/min (ref >60)
GFR calc non Af Amer: >60 mL/min (ref >60)
Glucose, Bld: 144 mg/dL — ABNORMAL HIGH (ref 70–99)
Potassium: 3.7 mmol/L (ref 3.5–5.1)
Sodium: 136 mmol/L (ref 135–145)

## 2019-07-22 LAB — POCT I-STAT 7, (LYTES, BLD GAS, ICA,H+H)
Acid-base deficit: 1 mmol/L (ref 0.0–2.0)
Bicarbonate: 23.3 mmol/L (ref 20.0–28.0)
Calcium, Ion: 1.09 mmol/L — ABNORMAL LOW (ref 1.15–1.40)
HCT: 34 % — ABNORMAL LOW (ref 39.0–52.0)
Hemoglobin: 11.6 g/dL — ABNORMAL LOW (ref 13.0–17.0)
O2 Saturation: 99 %
Patient temperature: 100.9
Potassium: 4.3 mmol/L (ref 3.5–5.1)
Sodium: 137 mmol/L (ref 135–145)
TCO2: 24 mmol/L (ref 22–32)
pCO2 arterial: 41 mmHg (ref 32.0–48.0)
pH, Arterial: 7.368 (ref 7.350–7.450)
pO2, Arterial: 127 mmHg — ABNORMAL HIGH (ref 83.0–108.0)

## 2019-07-22 LAB — PHOSPHORUS: Phosphorus: 3.8 mg/dL (ref 2.5–4.6)

## 2019-07-22 LAB — MAGNESIUM: Magnesium: 1.4 mg/dL — ABNORMAL LOW (ref 1.7–2.4)

## 2019-07-22 LAB — TRIGLYCERIDES: Triglycerides: 199 mg/dL — ABNORMAL HIGH (ref <150)

## 2019-07-22 LAB — HIV ANTIBODY (ROUTINE TESTING W REFLEX): HIV Screen 4th Generation wRfx: NONREACTIVE

## 2019-07-22 MED ORDER — MAGNESIUM SULFATE 2 GM/50ML IV SOLN
2.0000 g | Freq: Once | INTRAVENOUS | Status: AC
  Administered 2019-07-22: 13:00:00 2 g via INTRAVENOUS
  Filled 2019-07-22: qty 50

## 2019-07-22 MED ORDER — THIAMINE HCL 100 MG PO TABS
100.0000 mg | ORAL_TABLET | Freq: Every day | ORAL | Status: DC
  Administered 2019-07-22 – 2019-07-23 (×2): 100 mg
  Filled 2019-07-22 (×2): qty 1

## 2019-07-22 MED ORDER — CHLORHEXIDINE GLUCONATE CLOTH 2 % EX PADS
6.0000 | MEDICATED_PAD | Freq: Every day | CUTANEOUS | Status: DC
  Administered 2019-07-25 – 2019-07-29 (×6): 6 via TOPICAL

## 2019-07-22 MED ORDER — CIPROFLOXACIN-DEXAMETHASONE 0.3-0.1 % OT SUSP
4.0000 [drp] | Freq: Two times a day (BID) | OTIC | Status: DC
  Administered 2019-07-22 – 2019-07-29 (×15): 4 [drp] via OTIC
  Filled 2019-07-22 (×3): qty 7.5

## 2019-07-22 MED ORDER — DEXMEDETOMIDINE HCL IN NACL 400 MCG/100ML IV SOLN
0.4000 ug/kg/h | INTRAVENOUS | Status: DC
  Administered 2019-07-22: 10:00:00 0.4 ug/kg/h via INTRAVENOUS
  Filled 2019-07-22: qty 100

## 2019-07-22 MED ORDER — FOLIC ACID 1 MG PO TABS
1.0000 mg | ORAL_TABLET | Freq: Every day | ORAL | Status: DC
  Administered 2019-07-22 – 2019-07-28 (×7): 1 mg
  Filled 2019-07-22 (×7): qty 1

## 2019-07-22 MED ORDER — ADULT MULTIVITAMIN LIQUID CH
15.0000 mL | Freq: Every day | ORAL | Status: DC
  Administered 2019-07-23 – 2019-07-29 (×7): 15 mL
  Filled 2019-07-22 (×7): qty 15

## 2019-07-22 MED ORDER — PANTOPRAZOLE SODIUM 40 MG PO PACK
40.0000 mg | PACK | Freq: Every day | ORAL | Status: DC
  Administered 2019-07-22 – 2019-07-29 (×8): 40 mg
  Filled 2019-07-22 (×8): qty 20

## 2019-07-22 MED ORDER — QUETIAPINE FUMARATE 50 MG PO TABS
50.0000 mg | ORAL_TABLET | Freq: Two times a day (BID) | ORAL | Status: DC
  Administered 2019-07-22 – 2019-07-29 (×14): 50 mg
  Filled 2019-07-22 (×14): qty 2

## 2019-07-22 MED ORDER — DOCUSATE SODIUM 50 MG/5ML PO LIQD
100.0000 mg | Freq: Every day | ORAL | Status: DC
  Administered 2019-07-22 – 2019-07-29 (×8): 100 mg
  Filled 2019-07-22 (×8): qty 10

## 2019-07-22 MED ORDER — ACETAMINOPHEN 160 MG/5ML PO SOLN
650.0000 mg | Freq: Four times a day (QID) | ORAL | Status: DC | PRN
  Administered 2019-07-22 – 2019-07-29 (×7): 650 mg
  Filled 2019-07-22 (×7): qty 20.3

## 2019-07-22 MED ORDER — CLONAZEPAM 0.5 MG PO TABS
0.5000 mg | ORAL_TABLET | Freq: Two times a day (BID) | ORAL | Status: DC
  Administered 2019-07-22 – 2019-07-23 (×3): 0.5 mg
  Filled 2019-07-22 (×3): qty 1

## 2019-07-22 NOTE — Progress Notes (Signed)
  NEUROSURGERY PROGRESS NOTE   Pt remains very agitated, on propofol.  EXAM:  BP (!) 128/92   Pulse (!) 124   Temp (!) 101.5 F (38.6 C) (Core)   Resp (!) 23   Ht 5\' 6"  (1.676 m)   Wt 92.2 kg   SpO2 100%   BMI 32.81 kg/m   Opens eyes to voice Pupils reactive Breathing over vent Purposeful/localizes BUE W/D RLE, no significant movement LLE  IMAGING: CTH repeat reviewed, demonstrates evolution of right orbitofrontal contusion. Diffuse SAH/SDH seen again including some pos fossa hemorrhage, tentorial hemorrhage, and mesencephalic hemorrhage.   CT C/A/P also personally reviewed with attention to C/T/L spine. Likely subchondral cyst in left C7-T1 facet, does not appear to be a fracture to me. No other fractures of the T or L spine seen, there is significant lumbar degenerative disease.  IMPRESSION:  59 y.o. male s/p fall with severe TBI, do not believe he requires intracranial pressure monitoring given his exam and no secondary radiographic evidence of intracranial HTN.  Unclear etiology of asymmetric exam (Left leg paresis). No evidence of T or L spine fracture. There was report of intermittent left arm weakness as well, ?post-traumatic SZ esp with large right frontal contusion.  PLAN: - Cont to monitor exam - EEG today - Cont supportive care

## 2019-07-22 NOTE — Progress Notes (Addendum)
Initial Nutrition Assessment  DOCUMENTATION CODES:   Obesity unspecified  INTERVENTION:   Once tube feeds initiated, recommend:  Vital HP @30ml /hr + Prostat 31ml QID via tube  Propofol: 12 ml/hr- provides 316kcal/day   Free water flushes 93ml q4 hours to maintain tube patency   Regimen provides 1436kcal/day, 123g/day protein, 745ml/day free water  MVI, thiamine and folic acid daily in setting of etoh abuse.  Pt at high refeed risk; recommend monitor K, Mg and P labs daily until stable.   NUTRITION DIAGNOSIS:   Inadequate oral intake related to inability to eat(pt sedated and ventilated) as evidenced by NPO status.  GOAL:   Provide needs based on ASPEN/SCCM guidelines  MONITOR:   Vent status, Labs, Weight trends, TF tolerance, Skin, I & O's  REASON FOR ASSESSMENT:   Consult Enteral/tube feeding initiation and management  ASSESSMENT:   59 y/o male wiht h/o etoh abuse, HTN, bipolar disorder admitted s/p fall and found to have L skull fractures, L temporal bone fractures, possible T1 fracture, rib fractures, pulmonary contusions and bilateral SDHs.  RD working remotely.  Pt sedated and ventilated. OGT in place. Tube feeds on hold per MD as pt with some ileus. Pt is at high refeed risk. There is no weight history in chart to determine if any significant weight changes pta.   Medications reviewed and include: colace, protonix, NaCl @100ml /hr, precedex, fentanyl, propofol  Labs reviewed: K 4.3 wnl, P 3.8 wnl, Mg 1.4(L) Triglycerides 199(H) Hgb 11.6(L), Hct 34.0(L)  Patient is currently intubated on ventilator support MV: 16.1 L/min Temp (24hrs), Avg:98.4 F (36.9 C), Min:94.1 F (34.5 C), Max:101.5 F (38.6 C)  Propofol: 12 ml/hr- provides 316kcal/day   MAP- >16mmHg  UOP-  Unable to complete Nutrition-Focused physical exam at this time.   Diet Order:   Diet Order            Diet NPO time specified  Diet effective now             EDUCATION  NEEDS:   No education needs have been identified at this time  Skin:  Skin Assessment: Reviewed RN Assessment(Laceration scalp)  Last BM:  pta  Height:   Ht Readings from Last 1 Encounters:  06/26/2019 5\' 6"  (1.676 m)    Weight:   Wt Readings from Last 1 Encounters:  07/20/2019 92.2 kg    Ideal Body Weight:  64.5 kg  BMI:  Body mass index is 32.81 kg/m.  Estimated Nutritional Needs:   Kcal:  1015-1291kcal/day  Protein:  >129g/day  Fluid:  1.9-2.2L/day  07/23/19 MS, RD, LDN Pager #- [phone] Office#- [phone] After Hours Pager: [phone]

## 2019-07-22 NOTE — Progress Notes (Addendum)
Patient ID: Eddie Torres, male   DOB: [birth year], 60 y.o.   MRN: [id number] Follow up - Trauma Critical Care  Patient Details:    Eddie Torres is an 59 y.o. male.  Lines/tubes : Airway 7.5 mm (Active)  Secured at (cm) 25 cm 07/22/19 0747  Measured From Lips 07/22/19 0747  Secured Location Right 07/22/19 0747  Secured By [employer] 07/22/19 0747  Tube Holder Repositioned Yes 07/22/19 0747  Cuff Pressure (cm H2O) 30 cm H2O 07/11/2019 1609  Site Condition Dry 07/22/19 0747     NG/OG Tube Orogastric Center mouth Aucultation (Active)  Site Assessment Clean;Dry;Intact 07/22/19 0800  Ongoing Placement Verification No change in cm markings or external length of tube from initial placement;No change in respiratory status;No acute changes, not attributed to clinical condition 06/30/2019 2000  Status Suction-low intermittent 07/22/19 0800  Amount of suction 110 mmHg 07/22/19 0800  Drainage Appearance Bile 07/22/19 0800     Urethral Catheter (Active)  Indication for Insertion or Continuance of Catheter Unstable critically ill patients first 24-48 hours (See Criteria) 07/22/19 0757  Site Assessment Clean;Intact;Dry 07/22/19 0757  Catheter Maintenance Bag below level of bladder;Catheter secured;Drainage bag/tubing not touching floor;No dependent loops;Seal intact;Insertion date on drainage bag;Bag emptied prior to transport 07/22/19 0757  Collection Container Standard drainage bag 07/22/19 0757  Securement Method Securing device (Describe) 07/22/19 0757  Urinary Catheter Interventions (if applicable) Unclamped 07/22/19 0757  Output (mL) 75 mL 07/22/19 0800    Microbiology/Sepsis markers: Results for orders placed or performed during the hospital encounter of 07/16/2019  Respiratory Panel by RT PCR (Flu A&B, Covid) - Nasopharyngeal Swab     Status: None   Collection Time: 06/24/2019  4:14 PM   Specimen: Nasopharyngeal Swab  Result Value Ref Range Status   SARS Coronavirus 2 by RT  PCR NEGATIVE NEGATIVE Final    Comment: (NOTE) SARS-CoV-2 target nucleic acids are NOT DETECTED. The SARS-CoV-2 RNA is generally detectable in upper respiratoy specimens during the acute phase of infection. The lowest concentration of SARS-CoV-2 viral copies this assay can detect is 131 copies/mL. A negative result does not preclude SARS-Cov-2 infection and should not be used as the sole basis for treatment or other patient management decisions. A negative result may occur with  improper specimen collection/handling, submission of specimen other than nasopharyngeal swab, presence of viral mutation(s) within the areas targeted by this assay, and inadequate number of viral copies (<131 copies/mL). A negative result must be combined with clinical observations, patient history, and epidemiological information. The expected result is Negative. Fact Sheet for Patients:  https://www.moore.com/ Fact Sheet for Healthcare Providers:  https://www.young.biz/ This test is not yet ap proved or cleared by the [place] FDA and  has been authorized for detection and/or diagnosis of SARS-CoV-2 by FDA under an Emergency Use Authorization (EUA). This EUA will remain  in effect (meaning this test can be used) for the duration of the COVID-19 declaration under Section 564(b)(1) of the Act, 21 U.S.C. section 360bbb-3(b)(1), unless the authorization is terminated or revoked sooner.    Influenza A by PCR NEGATIVE NEGATIVE Final   Influenza B by PCR NEGATIVE NEGATIVE Final    Comment: (NOTE) The Xpert Xpress SARS-CoV-2/FLU/RSV assay is intended as an aid in  the diagnosis of influenza from Nasopharyngeal swab specimens and  should not be used as a sole basis for treatment. Nasal washings and  aspirates are unacceptable for Xpert Xpress SARS-CoV-2/FLU/RSV  testing. Fact Sheet for Patients: https://www.moore.com/ Fact Sheet for Healthcare  Providers: https://www.young.biz/ This test is not yet approved or cleared by the [place] and  has been authorized for detection and/or diagnosis of SARS-CoV-2 by  FDA under an Emergency Use Authorization (EUA). This EUA will remain  in effect (meaning this test can be used) for the duration of the  Covid-19 declaration under Section 564(b)(1) of the Act, 21  U.S.C. section 360bbb-3(b)(1), unless the authorization is  terminated or revoked. Performed at [facility], [address] [place], [place] [address]   MRSA PCR Screening     Status: None   Collection Time: 2019/08/17  6:29 PM   Specimen: Nasopharyngeal  Result Value Ref Range Status   MRSA by PCR NEGATIVE NEGATIVE Final    Comment:        The GeneXpert MRSA Assay (FDA approved for NASAL specimens only), is one component of a comprehensive MRSA colonization surveillance program. It is not intended to diagnose MRSA infection nor to guide or monitor treatment for MRSA infections. Performed at [facility], [address], [place], [place] [address]     Anti-infectives:  Anti-infectives (From admission, onward)   Start     Dose/Rate Route Frequency Ordered Stop   17-Aug-2019 1645  ceFAZolin (ANCEF) IVPB 2g/100 mL premix     2 g 200 mL/hr over 30 Minutes Intravenous  Once 2019-08-17 1630 August 17, 2019 1717      Best Practice/Protocols:  VTE Prophylaxis: Mechanical Continous Sedation  Consults: Treatment Team:  Lisbeth Renshaw, MD    Studies:    Events:  Subjective:    Overnight Issues:   Objective:  Vital signs for last 24 hours: Temp:  [94.1 F (34.5 C)-101.5 F (38.6 C)] 101.5 F (38.6 C) (01/30 0800) Pulse Rate:  [76-128] 110 (01/30 0800) Resp:  [10-28] 24 (01/30 0800) BP: (105-204)/(63-156) 117/71 (01/30 0800) SpO2:  [85 %-100 %] 100 % (01/30 0800) FiO2 (%):  [40 %-100 %] 40 % (01/30 0748) Weight:  [92.2 kg-100 kg] 92.2 kg (01/29 1800)  Hemodynamic  parameters for last 24 hours:    Intake/Output from previous day: 01/29 0701 - 01/30 0700 In: 2922.9 [I.V.:2722.9; IV Piggyback:200] Out: 895 [Urine:895]  Intake/Output this shift: Total I/O In: 136.7 [I.V.:136.7] Out: 75 [Urine:75]  Vent settings for last 24 hours: Vent Mode: PRVC FiO2 (%):  [40 %-100 %] 40 % Set Rate:  [20 bmp-24 bmp] 24 bmp Vt Set:  [510 mL] 510 mL PEEP:  [5 cmH20] 5 cmH20 Pressure Support:  [10 cmH20] 10 cmH20 Plateau Pressure:  [22 cmH20-29 cmH20] 23 cmH20  Physical Exam:  General: on vent Neuro: pupils 62mm, agitated, moves UE more than LE, not clearly F/C HEENT/Neck: ETT and collar Resp: clear to auscultation bilaterally CVS: RRR GI: soft, quiet, some distention Extremities: no edema, no erythema, pulses WNL  Results for orders placed or performed during the hospital encounter of 17-Aug-2019 (from the past 24 hour(s))  CDS serology     Status: None   Collection Time: 2019/08/17  3:59 PM  Result Value Ref Range   CDS serology specimen      SPECIMEN WILL BE HELD FOR 14 DAYS IF TESTING IS REQUIRED  Comprehensive metabolic panel     Status: Abnormal   Collection Time: August 17, 2019  3:59 PM  Result Value Ref Range   Sodium 135 135 - 145 mmol/L   Potassium 3.5 3.5 - 5.1 mmol/L   Chloride 100 98 - 111 mmol/L   CO2 20 (L) 22 - 32 mmol/L   Glucose,  Bld 148 (H) 70 - 99 mg/dL   BUN 14 6 - 20 mg/dL   Creatinine, Ser 0.94 0.61 - 1.24 mg/dL   Calcium 8.3 (L) 8.9 - 10.3 mg/dL   Total Protein 6.8 6.5 - 8.1 g/dL   Albumin 3.7 3.5 - 5.0 g/dL   AST 49 (H) 15 - 41 U/L   ALT 58 (H) 0 - 44 U/L   Alkaline Phosphatase 77 38 - 126 U/L   Total Bilirubin 0.6 0.3 - 1.2 mg/dL   GFR calc non Af Amer >60 >60 mL/min   GFR calc Af Amer >60 >60 mL/min   Anion gap 15 5 - 15  CBC     Status: Abnormal   Collection Time: 08/06/19  3:59 PM  Result Value Ref Range   WBC 8.6 4.0 - 10.5 K/uL   RBC 4.40 4.22 - 5.81 MIL/uL   Hemoglobin 15.1 13.0 - 17.0 g/dL   HCT 44.1 39.0 - 52.0 %    MCV 100.2 (H) 80.0 - 100.0 fL   MCH 34.3 (H) 26.0 - 34.0 pg   MCHC 34.2 30.0 - 36.0 g/dL   RDW 11.4 (L) 11.5 - 15.5 %   Platelets 169 150 - 400 K/uL   nRBC 0.0 0.0 - 0.2 %  Ethanol     Status: Abnormal   Collection Time: Aug 06, 2019  3:59 PM  Result Value Ref Range   Alcohol, Ethyl (B) 354 (HH) <10 mg/dL  Lactic acid, plasma     Status: Abnormal   Collection Time: 08-06-2019  3:59 PM  Result Value Ref Range   Lactic Acid, Venous 2.8 (HH) 0.5 - 1.9 mmol/L  Protime-INR     Status: None   Collection Time: 08/06/2019  3:59 PM  Result Value Ref Range   Prothrombin Time 12.1 11.4 - 15.2 seconds   INR 0.9 0.8 - 1.2  Sample to Blood Bank     Status: None   Collection Time: 08/06/19  4:06 PM  Result Value Ref Range   Blood Bank Specimen SAMPLE AVAILABLE FOR TESTING    Sample Expiration      07/22/2019,2359 Performed at [facility], [address] [place], [place] [address]   I-stat chem 8, ED     Status: Abnormal   Collection Time: 08-06-19  4:11 PM  Result Value Ref Range   Sodium 136 135 - 145 mmol/L   Potassium 3.5 3.5 - 5.1 mmol/L   Chloride 102 98 - 111 mmol/L   BUN 15 6 - 20 mg/dL   Creatinine, Ser 1.30 (H) 0.61 - 1.24 mg/dL   Glucose, Bld 145 (H) 70 - 99 mg/dL   Calcium, Ion 0.93 (L) 1.15 - 1.40 mmol/L   TCO2 23 22 - 32 mmol/L   Hemoglobin 15.3 13.0 - 17.0 g/dL   HCT 45.0 39.0 - 52.0 %  Respiratory Panel by RT PCR (Flu A&B, Covid) - Nasopharyngeal Swab     Status: None   Collection Time: 2019/08/06  4:14 PM   Specimen: Nasopharyngeal Swab  Result Value Ref Range   SARS Coronavirus 2 by RT PCR NEGATIVE NEGATIVE   Influenza A by PCR NEGATIVE NEGATIVE   Influenza B by PCR NEGATIVE NEGATIVE  I-STAT 7, (LYTES, BLD GAS, ICA, H+H)     Status: Abnormal   Collection Time: Aug 06, 2019  4:56 PM  Result Value Ref Range   pH, Arterial 7.264 (L) 7.350 - 7.450   pCO2 arterial 49.5 (H) 32.0 - 48.0 mmHg   pO2, Arterial 252.0 (  H) 83.0 - 108.0 mmHg   Bicarbonate 22.8 20.0 - 28.0 mmol/L    TCO2 24 22 - 32 mmol/L   O2 Saturation 100.0 %   Acid-base deficit 5.0 (H) 0.0 - 2.0 mmol/L   Sodium 135 135 - 145 mmol/L   Potassium 3.6 3.5 - 5.1 mmol/L   Calcium, Ion 1.10 (L) 1.15 - 1.40 mmol/L   HCT 38.0 (L) 39.0 - 52.0 %   Hemoglobin 12.9 (L) 13.0 - 17.0 g/dL   Patient temperature 58.0 F    Sample type ARTERIAL   MRSA PCR Screening     Status: None   Collection Time: 08/17/19  6:29 PM   Specimen: Nasopharyngeal  Result Value Ref Range   MRSA by PCR NEGATIVE NEGATIVE  Urinalysis, Routine w reflex microscopic     Status: Abnormal   Collection Time: August 17, 2019  6:30 PM  Result Value Ref Range   Color, Urine STRAW (A) YELLOW   APPearance CLEAR CLEAR   Specific Gravity, Urine >1.046 (H) 1.005 - 1.030   pH 5.0 5.0 - 8.0   Glucose, UA NEGATIVE NEGATIVE mg/dL   Hgb urine dipstick NEGATIVE NEGATIVE   Bilirubin Urine NEGATIVE NEGATIVE   Ketones, ur 20 (A) NEGATIVE mg/dL   Protein, ur 30 (A) NEGATIVE mg/dL   Nitrite NEGATIVE NEGATIVE   Leukocytes,Ua NEGATIVE NEGATIVE   RBC / HPF 0-5 0 - 5 RBC/hpf   WBC, UA 0-5 0 - 5 WBC/hpf   Bacteria, UA NONE SEEN NONE SEEN   Squamous Epithelial / LPF 0-5 0 - 5  Glucose, capillary     Status: Abnormal   Collection Time: 17-Aug-2019  7:33 PM  Result Value Ref Range   Glucose-Capillary 174 (H) 70 - 99 mg/dL  Glucose, capillary     Status: Abnormal   Collection Time: 08-17-2019 11:33 PM  Result Value Ref Range   Glucose-Capillary 148 (H) 70 - 99 mg/dL  CBC     Status: Abnormal   Collection Time: 07/22/19  2:35 AM  Result Value Ref Range   WBC 16.8 (H) 4.0 - 10.5 K/uL   RBC 3.55 (L) 4.22 - 5.81 MIL/uL   Hemoglobin 12.1 (L) 13.0 - 17.0 g/dL   HCT 99.8 (L) 33.8 - 25.0 %   MCV 99.7 80.0 - 100.0 fL   MCH 34.1 (H) 26.0 - 34.0 pg   MCHC 34.2 30.0 - 36.0 g/dL   RDW 53.9 76.7 - 34.1 %   Platelets 155 150 - 400 K/uL   nRBC 0.0 0.0 - 0.2 %  Basic metabolic panel     Status: Abnormal   Collection Time: 07/22/19  2:35 AM  Result Value Ref Range   Sodium  136 135 - 145 mmol/L   Potassium 3.7 3.5 - 5.1 mmol/L   Chloride 101 98 - 111 mmol/L   CO2 24 22 - 32 mmol/L   Glucose, Bld 144 (H) 70 - 99 mg/dL   BUN 11 6 - 20 mg/dL   Creatinine, Ser 9.37 0.61 - 1.24 mg/dL   Calcium 7.7 (L) 8.9 - 10.3 mg/dL   GFR calc non Af Amer >60 >60 mL/min   GFR calc Af Amer >60 >60 mL/min   Anion gap 11 5 - 15  Magnesium     Status: Abnormal   Collection Time: 07/22/19  2:35 AM  Result Value Ref Range   Magnesium 1.4 (L) 1.7 - 2.4 mg/dL  Phosphorus     Status: None   Collection Time: 07/22/19  2:35 AM  Result  Value Ref Range   Phosphorus 3.8 2.5 - 4.6 mg/dL  Triglycerides     Status: Abnormal   Collection Time: 07/22/19  2:37 AM  Result Value Ref Range   Triglycerides 199 (H) <150 mg/dL  Glucose, capillary     Status: Abnormal   Collection Time: 07/22/19  3:22 AM  Result Value Ref Range   Glucose-Capillary 134 (H) 70 - 99 mg/dL  I-STAT 7, (LYTES, BLD GAS, ICA, H+H)     Status: Abnormal   Collection Time: 07/22/19  4:08 AM  Result Value Ref Range   pH, Arterial 7.368 7.350 - 7.450   pCO2 arterial 41.0 32.0 - 48.0 mmHg   pO2, Arterial 127.0 (H) 83.0 - 108.0 mmHg   Bicarbonate 23.3 20.0 - 28.0 mmol/L   TCO2 24 22 - 32 mmol/L   O2 Saturation 99.0 %   Acid-base deficit 1.0 0.0 - 2.0 mmol/L   Sodium 137 135 - 145 mmol/L   Potassium 4.3 3.5 - 5.1 mmol/L   Calcium, Ion 1.09 (L) 1.15 - 1.40 mmol/L   HCT 34.0 (L) 39.0 - 52.0 %   Hemoglobin 11.6 (L) 13.0 - 17.0 g/dL   Patient temperature 469.6100.9 F    Collection site RADIAL, ALLEN'S TEST ACCEPTABLE    Drawn by RT    Sample type ARTERIAL   Glucose, capillary     Status: Abnormal   Collection Time: 07/22/19  8:19 AM  Result Value Ref Range   Glucose-Capillary 139 (H) 70 - 99 mg/dL   Comment 1 Notify RN    Comment 2 Document in Chart     Assessment & Plan: Present on Admission: . ICH (intracerebral hemorrhage) (HCC)    LOS: 1 day   Additional comments:I reviewed the patient's new clinical lab test  results. . Fall down 10 stairs BL SDH/SAH, Large R frontal contusion, Tentorial and falcine SDH- per Dr. Conchita ParisNundkumar, repeat CT H shows some enlargement of contusions an SAH L occipital skull fx, L tem Temporal bone fx with blood in L middle ear - ENT consult pending Acute hypoxic ventilator dependent respiratory failure - did not wean well this AM, continue trials Pneumocephalus  - abx Scalp laceration - stapled in ED Possible T1 fracture - keep in collar for now, per NS L 10th rib fracture, old rib fractures L 9-10 - no PTX Aspiration  HTN EtOH abuse - CIWA, 354 on admit, start Precedex as some of this agitation may be ETOH WD Bipolar disorder FEN - some ileus so no TF yet. Add klon/sero, Na OK VTE - PAS Dispo - ICU Critical Care Total Time*: 34 Minutes  Violeta GelinasBurke Maclovia Uher, MD, MPH, FACS Trauma & General Surgery Use [email or web address] to contact on call provider  07/22/2019  *Care during the described time interval was provided by me. I have reviewed this patient's available data, including medical history, events of note, physical examination and test results as part of my evaluation.

## 2019-07-22 NOTE — Consult Note (Signed)
ENT/FACIAL TRAUMA CONSULT:  Reason for Consult: Left temporal bone fracture Referring Physician:  Trauma Service  Eddie Torres is an 59 y.o. male.   HPI: ENT service consulted by trauma for evaluation of level 1 trauma admitted on 07-25-19.  Patient is a 59 year old male who fell down a flight of stairs.  Transported to [facility] by EMS with an initial GCS of 3.  Patient intubated for airway management and control.  Patient with extensive posterior occipital laceration and complex skull fractures.  Patient admitted to [facility] service for ICU management.  Neurosurgery and ENT consulted.  History reviewed. No pertinent past medical history.  History reviewed. No pertinent surgical history.  No family history on file.  Social History:  has no history on file for tobacco, alcohol, and drug.  Allergies:  Allergies  Allergen Reactions  . Shellfish-Derived Products Anaphylaxis and Shortness Of Breath    Medications: I have reviewed the patient's current medications.  Results for orders placed or performed during the hospital encounter of 04/30/2020 (from the past 48 hour(s))  CDS serology     Status: None   Collection Time: 04/30/2020  3:59 PM  Result Value Ref Range   CDS serology specimen      SPECIMEN WILL BE HELD FOR 14 DAYS IF TESTING IS REQUIRED    Comment: Performed at [facility], [address] [place], [place] [address]  Comprehensive metabolic panel     Status: Abnormal   Collection Time: 04/30/2020  3:59 PM  Result Value Ref Range   Sodium 135 135 - 145 mmol/L   Potassium 3.5 3.5 - 5.1 mmol/L   Chloride 100 98 - 111 mmol/L   CO2 20 (L) 22 - 32 mmol/L   Glucose, Bld 148 (H) 70 - 99 mg/dL   BUN 14 6 - 20 mg/dL   Creatinine, Ser 2.130.94 0.61 - 1.24 mg/dL   Calcium 8.3 (L) 8.9 - 10.3 mg/dL   Total Protein 6.8 6.5 - 8.1 g/dL   Albumin 3.7 3.5 - 5.0 g/dL   AST 49 (H) 15 - 41 U/L   ALT 58 (H) 0 - 44 U/L   Alkaline Phosphatase 77 38 - 126 U/L   Total  Bilirubin 0.6 0.3 - 1.2 mg/dL   GFR calc non Af Amer >60 >60 mL/min   GFR calc Af Amer >60 >60 mL/min   Anion gap 15 5 - 15    Comment: Performed at [facility], [address] [place], [place] [address]  CBC     Status: Abnormal   Collection Time: 04/30/2020  3:59 PM  Result Value Ref Range   WBC 8.6 4.0 - 10.5 K/uL   RBC 4.40 4.22 - 5.81 MIL/uL   Hemoglobin 15.1 13.0 - 17.0 g/dL   HCT 84.644.1 96.239.0 - 95.252.0 %   MCV 100.2 (H) 80.0 - 100.0 fL   MCH 34.3 (H) 26.0 - 34.0 pg   MCHC 34.2 30.0 - 36.0 g/dL   RDW 84.111.4 (L) 32.411.5 - 40.115.5 %   Platelets 169 150 - 400 K/uL   nRBC 0.0 0.0 - 0.2 %    Comment: Performed at [facility], [address] [place], [place] [address]  Ethanol     Status: Abnormal   Collection Time: 04/30/2020  3:59 PM  Result Value Ref Range   Alcohol, Ethyl (B) 354 (HH) <10 mg/dL    Comment: CRITICAL RESULT CALLED TO, READ BACK BY AND VERIFIED WITH: C BAIN,RN [id number]  2019/07/28 D BRADLEY (NOTE) Lowest detectable limit for serum alcohol is 10 mg/dL. For medical purposes only. Performed at [facility], [address], [place], [place] [address]   Lactic acid, plasma     Status: Abnormal   Collection Time: 28-Jul-2019  3:59 PM  Result Value Ref Range   Lactic Acid, Venous 2.8 (HH) 0.5 - 1.9 mmol/L    Comment: CRITICAL RESULT CALLED TO, READ BACK BY AND VERIFIED WITH: T PHILLIPS,RN 1648 2019-07-28 D BRADLEY Performed at [facility], [address], [place], [place] [address]   Protime-INR     Status: None   Collection Time: 07-28-2019  3:59 PM  Result Value Ref Range   Prothrombin Time 12.1 11.4 - 15.2 seconds   INR 0.9 0.8 - 1.2    Comment: (NOTE) INR goal varies based on device and disease states. Performed at [facility], [address] [place], [place] [address]   Sample to Blood Bank     Status: None   Collection Time: 2019/07/28  4:06 PM  Result Value Ref Range   Blood Bank Specimen SAMPLE AVAILABLE FOR TESTING    Sample Expiration       07/22/2019,2359 Performed at [facility], [address] [place], [place] [address]   I-stat chem 8, ED     Status: Abnormal   Collection Time: 07/28/19  4:11 PM  Result Value Ref Range   Sodium 136 135 - 145 mmol/L   Potassium 3.5 3.5 - 5.1 mmol/L   Chloride 102 98 - 111 mmol/L   BUN 15 6 - 20 mg/dL   Creatinine, Ser 6.57 (H) 0.61 - 1.24 mg/dL   Glucose, Bld 846 (H) 70 - 99 mg/dL   Calcium, Ion 9.62 (L) 1.15 - 1.40 mmol/L   TCO2 23 22 - 32 mmol/L   Hemoglobin 15.3 13.0 - 17.0 g/dL   HCT 95.2 84.1 - 32.4 %  Respiratory Panel by RT PCR (Flu A&B, Covid) - Nasopharyngeal Swab     Status: None   Collection Time: Jul 28, 2019  4:14 PM   Specimen: Nasopharyngeal Swab  Result Value Ref Range   SARS Coronavirus 2 by RT PCR NEGATIVE NEGATIVE    Comment: (NOTE) SARS-CoV-2 target nucleic acids are NOT DETECTED. The SARS-CoV-2 RNA is generally detectable in upper respiratoy specimens during the acute phase of infection. The lowest concentration of SARS-CoV-2 viral copies this assay can detect is 131 copies/mL. A negative result does not preclude SARS-Cov-2 infection and should not be used as the sole basis for treatment or other patient management decisions. A negative result may occur with  improper specimen collection/handling, submission of specimen other than nasopharyngeal swab, presence of viral mutation(s) within the areas targeted by this assay, and inadequate number of viral copies (<131 copies/mL). A negative result must be combined with clinical observations, patient history, and epidemiological information. The expected result is Negative. Fact Sheet for Patients:  https://www.moore.com/ Fact Sheet for Healthcare Providers:  https://www.young.biz/ This test is not yet ap proved or cleared by the [place] FDA and  has been authorized for detection and/or diagnosis of SARS-CoV-2 by FDA under an Emergency Use Authorization  (EUA). This EUA will remain  in effect (meaning this test can be used) for the duration of the COVID-19 declaration under Section 564(b)(1) of the Act, 21 U.S.C. section 360bbb-3(b)(1), unless the authorization is terminated or revoked sooner.    Influenza A by PCR NEGATIVE NEGATIVE   Influenza B by PCR  NEGATIVE NEGATIVE    Comment: (NOTE) The Xpert Xpress SARS-CoV-2/FLU/RSV assay is intended as an aid in  the diagnosis of influenza from Nasopharyngeal swab specimens and  should not be used as a sole basis for treatment. Nasal washings and  aspirates are unacceptable for Xpert Xpress SARS-CoV-2/FLU/RSV  testing. Fact Sheet for Patients: https://www.moore.com/https://www.fda.gov/media/142436/download Fact Sheet for Healthcare Providers: https://www.young.biz/https://www.fda.gov/media/142435/download This test is not yet approved or cleared by the [place] FDA and  has been authorized for detection and/or diagnosis of SARS-CoV-2 by  FDA under an Emergency Use Authorization (EUA). This EUA will remain  in effect (meaning this test can be used) for the duration of the  Covid-19 declaration under Section 564(b)(1) of the Act, 21  U.S.C. section 360bbb-3(b)(1), unless the authorization is  terminated or revoked. Performed at [facility], [address] [place], [place] [address]   I-STAT 7, (LYTES, BLD GAS, ICA, H+H)     Status: Abnormal   Collection Time: 07/18/2019  4:56 PM  Result Value Ref Range   pH, Arterial 7.264 (L) 7.350 - 7.450   pCO2 arterial 49.5 (H) 32.0 - 48.0 mmHg   pO2, Arterial 252.0 (H) 83.0 - 108.0 mmHg   Bicarbonate 22.8 20.0 - 28.0 mmol/L   TCO2 24 22 - 32 mmol/L   O2 Saturation 100.0 %   Acid-base deficit 5.0 (H) 0.0 - 2.0 mmol/L   Sodium 135 135 - 145 mmol/L   Potassium 3.6 3.5 - 5.1 mmol/L   Calcium, Ion 1.10 (L) 1.15 - 1.40 mmol/L   HCT 38.0 (L) 39.0 - 52.0 %   Hemoglobin 12.9 (L) 13.0 - 17.0 g/dL   Patient temperature 29.596.3 F    Sample type ARTERIAL   MRSA PCR Screening     Status:  None   Collection Time: 07/17/2019  6:29 PM   Specimen: Nasopharyngeal  Result Value Ref Range   MRSA by PCR NEGATIVE NEGATIVE    Comment:        The GeneXpert MRSA Assay (FDA approved for NASAL specimens only), is one component of a comprehensive MRSA colonization surveillance program. It is not intended to diagnose MRSA infection nor to guide or monitor treatment for MRSA infections. Performed at [facility], [address] [place], [place] [address]   Urinalysis, Routine w reflex microscopic     Status: Abnormal   Collection Time: 07/12/2019  6:30 PM  Result Value Ref Range   Color, Urine STRAW (A) YELLOW   APPearance CLEAR CLEAR   Specific Gravity, Urine >1.046 (H) 1.005 - 1.030   pH 5.0 5.0 - 8.0   Glucose, UA NEGATIVE NEGATIVE mg/dL   Hgb urine dipstick NEGATIVE NEGATIVE   Bilirubin Urine NEGATIVE NEGATIVE   Ketones, ur 20 (A) NEGATIVE mg/dL   Protein, ur 30 (A) NEGATIVE mg/dL   Nitrite NEGATIVE NEGATIVE   Leukocytes,Ua NEGATIVE NEGATIVE   RBC / HPF 0-5 0 - 5 RBC/hpf   WBC, UA 0-5 0 - 5 WBC/hpf   Bacteria, UA NONE SEEN NONE SEEN   Squamous Epithelial / LPF 0-5 0 - 5    Comment: Performed at [facility], [address] [place], [place] [address]  Glucose, capillary     Status: Abnormal   Collection Time: 07/14/2019  7:33 PM  Result Value Ref Range   Glucose-Capillary 174 (H) 70 - 99 mg/dL  Glucose, capillary     Status: Abnormal   Collection Time: 07/06/2019 11:33 PM  Result Value Ref Range  Glucose-Capillary 148 (H) 70 - 99 mg/dL  CBC     Status: Abnormal   Collection Time: 07/22/19  2:35 AM  Result Value Ref Range   WBC 16.8 (H) 4.0 - 10.5 K/uL   RBC 3.55 (L) 4.22 - 5.81 MIL/uL   Hemoglobin 12.1 (L) 13.0 - 17.0 g/dL   HCT 93.7 (L) 90.2 - 40.9 %   MCV 99.7 80.0 - 100.0 fL   MCH 34.1 (H) 26.0 - 34.0 pg   MCHC 34.2 30.0 - 36.0 g/dL   RDW 73.5 32.9 - 92.4 %   Platelets 155 150 - 400 K/uL   nRBC 0.0 0.0 - 0.2 %    Comment: Performed at [facility], [address], [place], [place] [address]  Basic metabolic panel     Status: Abnormal   Collection Time: 07/22/19  2:35 AM  Result Value Ref Range   Sodium 136 135 - 145 mmol/L   Potassium 3.7 3.5 - 5.1 mmol/L   Chloride 101 98 - 111 mmol/L   CO2 24 22 - 32 mmol/L   Glucose, Bld 144 (H) 70 - 99 mg/dL   BUN 11 6 - 20 mg/dL   Creatinine, Ser 1.96 0.61 - 1.24 mg/dL   Calcium 7.7 (L) 8.9 - 10.3 mg/dL   GFR calc non Af Amer >60 >60 mL/min   GFR calc Af Amer >60 >60 mL/min   Anion gap 11 5 - 15    Comment: Performed at [facility], [address] [place], [place] [address]  Magnesium     Status: Abnormal   Collection Time: 07/22/19  2:35 AM  Result Value Ref Range   Magnesium 1.4 (L) 1.7 - 2.4 mg/dL    Comment: Performed at [facility], [address] [place], [place] [address]  Phosphorus     Status: None   Collection Time: 07/22/19  2:35 AM  Result Value Ref Range   Phosphorus 3.8 2.5 - 4.6 mg/dL    Comment: Performed at [facility], [address], [place], [place] [address]  Triglycerides     Status: Abnormal   Collection Time: 07/22/19  2:37 AM  Result Value Ref Range   Triglycerides 199 (H) <150 mg/dL    Comment: Performed at [facility], [address] [place], [place] [address]  Glucose, capillary     Status: Abnormal   Collection Time: 07/22/19  3:22 AM  Result Value Ref Range   Glucose-Capillary 134 (H) 70 - 99 mg/dL  I-STAT 7, (LYTES, BLD GAS, ICA, H+H)     Status: Abnormal   Collection Time: 07/22/19  4:08 AM  Result Value Ref Range   pH, Arterial 7.368 7.350 - 7.450   pCO2 arterial 41.0 32.0 - 48.0 mmHg   pO2, Arterial 127.0 (H) 83.0 - 108.0 mmHg   Bicarbonate 23.3 20.0 - 28.0 mmol/L   TCO2 24 22 - 32 mmol/L   O2 Saturation 99.0 %   Acid-base deficit 1.0 0.0 - 2.0 mmol/L   Sodium 137 135 - 145 mmol/L   Potassium 4.3 3.5 - 5.1 mmol/L   Calcium, Ion 1.09 (L) 1.15 - 1.40 mmol/L   HCT 34.0 (L) 39.0 - 52.0 %   Hemoglobin 11.6  (L) 13.0 - 17.0 g/dL   Patient temperature 481.8 F    Collection site RADIAL, ALLEN'S TEST ACCEPTABLE    Drawn by RT    Sample type ARTERIAL   Glucose, capillary  Status: Abnormal   Collection Time: 07/22/19  8:19 AM  Result Value Ref Range   Glucose-Capillary 139 (H) 70 - 99 mg/dL   Comment 1 Notify RN    Comment 2 Document in Chart     CT HEAD WO CONTRAST  Addendum Date: 07/06/2019   ADDENDUM REPORT: 07/19/2019 23:23 ADDENDUM: Trace amount posterior fossa subarachnoid hemorrhage particularly along the right cerebellar hemisphere is also unchanged from prior. Initial findings and addendum were called by telephone on 06/24/2019 at 11:23 pm to provider Cindra Presume , who verbally acknowledged these results. Electronically Signed   By: Kreg Shropshire M.D.   On: 07/03/2019 23:23   Result Date: 07/02/2019 CLINICAL DATA:  Subarachnoid hemorrhage follow-up EXAM: CT HEAD WITHOUT CONTRAST TECHNIQUE: Contiguous axial images were obtained from the base of the skull through the vertex without intravenous contrast. COMPARISON:  CT head 07/02/2019 FINDINGS: Brain: Enlarging size of a right frontal hemorrhagic contusion measuring 1.8 x 2.8 cm in size (2/17) with some additional a developing a parenchymal contusion laterally. Persistent pneumocephaly along the left frontal convexity and anterior falx. Large left subdural hematoma measuring up to 5 mm in maximal thickness. Additional subdural hemorrhage collecting over the right anterior frontal convexity also measuring up to 5 mm in thickness. Extensive of parafalcine subdural hemorrhage is noted as well measuring up to 6 mm in thickness anteriorly. Fluid is again seen layering along the left bowels and left tentorium. Though some small amount of hemorrhage is seen along the medial right tentorial leaflet as well. Extensive bilateral subarachnoid hemorrhages extending across the frontotemporal parietal regions in sylvian fissures, right slightly greater than  left, slightly increasing in the right from comparison study. There is trace hemorrhage in the occipital horns of the lateral ventricles, new from prior. No midline shift. Basilar cisterns remain patent. No superimposed areas of cortical infarct. Vascular: Atherosclerotic calcification of the carotid siphons. No hyperdense vessel. Skull: Complex left temporal bone fracture with diastasis of the petrous quite most ule suture. With additional fracture adjacent the lambdoid suture as well. Extensive overlying scalp hematoma with skin staples and bandaging material seen posteriorly. There is a fracture through the left sphenoid wall and curvilinear fracture through the plane of sphenoidale P right lateral wall sphenoid fracture is also suspected (3/27). Sinuses/Orbits: Extensive ethmoidal and sphenoidal hemosinus. Minimal mural thickening in the maxillary sinuses. Frontal sinuses demonstrate some layering pneumatized secretions, possibly additional hemorrhage. Left mastoid middle ear opacification compatible with hemorrhage. Right middle ear cavity and mastoids remain patent. No retro septal stranding or gas seen within the orbits. Other: None IMPRESSION: Extensive multifocal posttraumatic intracranial hemorrhage with enlarging areas of hemorrhagic parenchymal contusion in the right frontal lobe, increasing size of a right frontotemporal subarachnoid hemorrhages, new hemorrhage along the right tentorial leaflet and developing intraventricular hemorrhage in the occipital horns. Redemonstration of complex left temporal bone fracture with adjacent suture all diastasis and cephalad extension to the lambdoid suture. Small amount of pneumocephaly along the left frontal convexity. Skull base fractures at the level of the planum sphenoidale and sphenoid sinuses which extend across the cavernous sinuses. Caution for intra cavernous injury is recommended, and if expected, CTA and CTV could be considered. Small amount of anterior  right parafalcine pneumocephaly as well. No associated midline shift or ventriculomegaly at this time. Basilar cisterns are patent. Currently attempting to contact the ordering provider with a critical value result. Addendum will be submitted upon case discussion. Electronically Signed: By: Kreg Shropshire M.D. On: 06/30/2019 23:07   CT  HEAD WO CONTRAST  Result Date: 08-19-19 CLINICAL DATA:  59 year old male status post unwitnessed fall down approximately 10 stairs. GCS of 3, left arm flaccid. Laceration to posterior head, blood from left ear. EXAM: CT HEAD WITHOUT CONTRAST TECHNIQUE: Contiguous axial images were obtained from the base of the skull through the vertex without intravenous contrast. COMPARISON:  Head CT 05/08/2017. FINDINGS: Brain: Scattered small volume pneumocephalus, Including along the anterior and posterior clinoid processes. Broad-based left side subdural hematoma measures 5-6 millimeters in thickness. Superimposed right anterior frontal convexity subdural hematoma measures 5 millimeters in thickness. Superimposed subdural blood along the falx and left tentorium. Superimposed 2 centimeter hemorrhagic contusion of the right anterior inferior frontal gyrus on series 3, image 21. Superimposed bilateral subarachnoid hemorrhage in the anterior cranial fossa, sylvian fissures right greater than left. There is also a small volume of subarachnoid hemorrhage in the left lateral occipital and posterior temporal sulci. No intraventricular hemorrhage or ventriculomegaly. No midline shift at this time. Basilar cisterns remain patent. No superimposed acute cortically based infarct. Vascular: Calcified atherosclerosis at the skull base. Small volume pneumocephalus Skull: Complex fracture at the left temporal bone (series 4, image 27 with both transverse and longitudinal components. There is associated diastasis of the adjacent suture (petro-squamosal suture) on series 4, image 25, and a linear nondisplaced  fracture tracking cephalad along the left lambdoid suture as seen on series 4, image 36. Additionally, a nondisplaced fracture through the left lateral wall of the sphenoid sinus is suspected on series 4, image 27 anterior to the left carotid canal. There is also suspicion of a nondisplaced curvilinear skull fracture through the sphenoid planum on series 4, image 35. Possible also right lateral sphenoid sinus wall fracture on image 30. Sinuses/Orbits: Opacified left middle ear and mastoid air cells likely due to hemorrhage. The left-side ossicles appear to remain aligned. Superimposed fractures as stated above. Right tympanic cavities and mastoids are clear. Hemorrhage layering in the sphenoid sinuses, and trace blood or fluid scattered in the bilateral ethmoid and maxillary sinuses. Frontal sinuses remain relatively clear. Other: Broad-based left posterior convexity scalp hematoma. Skin staples in place. Orbits soft tissues appear to remain normal. IMPRESSION: 1. Multifocal acute posttraumatic intracranial hemorrhage: - broad-based left subdural hematoma, 5-6 mm. - right anterior frontal convexity subdural hematoma, 5 mm. - 20 mm right anterior frontal lobe hemorrhagic contusion. - scattered bilateral subarachnoid hemorrhage. - para falcine subdural blood. 2. Complex left temporal bone fracture associated with diastasis of the adjacent suture and a linear nondisplaced fracture tracking cephalad along the left lambdoid suture. 3. Subtle nondisplaced central skull base fractures are suspected at the sphenoid planum and lateral walls of both sphenoid sinuses. 4. Associated small volume pneumocephalus. Associated hemorrhage in the sphenoid sinuses, left middle ear and mastoid. 5. No midline shift or ventriculomegaly. Basilar cisterns remain patent at this time. 6. Broad-based left posterior convexity scalp hematoma. Salient findings on this exam were initially reviewed in person with Trauma Surgery Dr. Bedelia Person On  19-Aug-2019 at 1639 hours. Electronically Signed   By: Odessa Fleming M.D.   On: 19-Aug-2019 17:10   CT CHEST W CONTRAST  Result Date: Aug 19, 2019 CLINICAL DATA:  59 year old male status post unwitnessed fall down approximately 10 stairs. GCS of 3, left arm flaccid. Laceration to posterior head, blood from left ear. EXAM: CT CHEST, ABDOMEN, AND PELVIS WITH CONTRAST TECHNIQUE: Multidetector CT imaging of the chest, abdomen and pelvis was performed following the standard protocol during bolus administration of intravenous contrast. CONTRAST:  OMNIPAQUE  IOHEXOL 300 MG/ML  SOLN COMPARISON:  CT cervical spine today reported separately. CTA chest 09/01/2008. FINDINGS: CT CHEST FINDINGS Cardiovascular: Thoracic aorta appears intact with mild atherosclerosis. No cardiomegaly or pericardial effusion. Other central mediastinal vascular structures appear patent and intact. Calcified carotid artery atherosclerosis on series 3, image 29. Mediastinum/Nodes: Negative. No mediastinal hematoma or lymphadenopathy. Enteric tube courses through the esophagus to the stomach. Lungs/Pleura: There is dependent and peribronchial patchy opacity in the right upper lobe. By report, the patient may have aspirated during intubation. Mild dependent opacity in the left upper lobe. Scattered additional subtle peribronchial ground-glass opacity in the right lung. No pneumothorax. No pleural effusion there is a small volume of pleural hematoma and/or adjacent pulmonary contusion in the left costophrenic angle adjacent to a mildly displaced left posterior 10th rib fracture (series 5, image 122). Musculoskeletal: Acute on chronic mildly displaced posterolateral left 10th rib fracture. Nearby chronic left 9th rib fracture. No other acute rib fracture identified. Sternum intact. Thoracic vertebrae and visible shoulder osseous structures appear intact. CT ABDOMEN PELVIS FINDINGS Hepatobiliary: Moderate to severe hepatic steatosis. No superimposed liver  injury identified. Negative gallbladder. Pancreas: Negative. 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 No splenic injury or perisplenic fluid. Adrenals/Urinary Tract: Normal adrenal glands. Bilateral renal enhancement is symmetric. Contrast excretion is symmetric and proximal ureters appear normal. No renal injury identified. Unremarkable urinary bladder. Stomach/Bowel: Mild sigmoid diverticulosis. Occasional diverticula in the ascending colon. Otherwise negative large bowel with diminutive or absent appendix. Negative terminal ileum. No dilated small bowel. Small fat containing umbilical hernia. Enteric tube terminates in the gastric fundus. Stomach and duodenum are within normal limits. No free air, free fluid. Vascular/Lymphatic: Intact abdominal aorta and other major arterial structures with mild calcified atherosclerosis. Portal venous system appears patent. No lymphadenopathy. Reproductive: Negative aside from small fat containing inguinal hernias. Other: No pelvic free fluid. Musculoskeletal: No acute lumbar vertebral fracture. Sacral ala and SI joints appear intact. No pelvis or proximal femur fracture identified. There is some superficial soft tissue contusion along the left lower flank/buttock on series 3, image 96. No other superficial soft tissue injury identified. IMPRESSION: 1. Acute on chronic mildly displaced fracture of the left posterior 10th rib. Small volume adjacent pleural hematoma and/or pulmonary contusion in the left costophrenic angle. But no pneumothorax or pleural effusion. 2. Other pulmonary opacity - mostly dependent in the right lung - is compatible with aspiration. 3. Superficial soft tissue contusion/hematoma over the left lower flank/buttock. No underlying fracture identified. 4. No other acute traumatic injury identified in the chest, abdomen, or pelvis. 5. Moderate to severe hepatic steatosis. This exam was preliminarily reviewed in person with Trauma Surgery  Dr. Bedelia Person on 07/20/2019 at 1646 hours. Electronically Signed   By: [place] Fleming M.D.   On: 07/09/2019 17:30   CT CERVICAL SPINE WO CONTRAST  Result Date: 07/12/2019 CLINICAL DATA:  59 year old male status post unwitnessed fall down approximately 10 stairs. GCS of 3, left arm flaccid. Laceration to posterior head. Blood from left ear. EXAM: CT CERVICAL SPINE WITHOUT CONTRAST TECHNIQUE: Multidetector CT imaging of the cervical spine was performed without intravenous contrast. Multiplanar CT image reconstructions were also generated. COMPARISON:  Head CT today reported separately. Cervical spine CT 05/22/2015. FINDINGS: Alignment: Mildly improved cervical lordosis since 2016. Cervicothoracic junction alignment is within normal limits. Bilateral posterior element alignment is within normal limits. Skull base and vertebrae: Partially visible left skull base fractures. The occipital condyles remain intact. Normal craniocervical alignment. C1 and C2 appear intact  and normally aligned. No cervical vertebral fracture is identified. Soft tissues and spinal canal: No prevertebral fluid or swelling. No visible canal hematoma. Fluid in the pharynx. Visible endotracheal and enteric tubes appear appropriately position. No discrete neck soft tissue injury identified. Disc levels: Advanced lower cervical spine disc, endplate degeneration. Widespread cervical posterior element degeneration. Lucency at the left T1 posterior elements, at the inferior C7-T1 facet joint on sagittal image 39 is increased since 2016, but more resembles a degenerative subchondral cyst than a fracture (coronal image 31). Up to mild degenerative cervical spinal stenosis is possible at C6-C7. Upper chest: No definite upper thoracic fracture identified, left T1 posterior element lucency described above. Patchy opacity in the upper lungs greater on the right, see comparison chest CT. IMPRESSION: 1.  No acute traumatic injury identified in the cervical spine. 2.  Progressed lower cervical and upper thoracic spine degeneration since a 2016 CT. Suspect a degenerative subchondral cyst rather than fracture of the left T1 pars (sagittal image 39). 3. See also CT Chest today are reported separately. Preliminary findings on this exam were initially reviewed in person with Trauma Surgery Dr. Bedelia Person On 07/29/19 at 1641 hours. Electronically Signed   By: Odessa Fleming M.D.   On: 2019-07-29 17:18   CT ABDOMEN PELVIS W CONTRAST  Result Date: 2019/07/29 CLINICAL DATA:  59 year old male status post unwitnessed fall down approximately 10 stairs. GCS of 3, left arm flaccid. Laceration to posterior head, blood from left ear. EXAM: CT CHEST, ABDOMEN, AND PELVIS WITH CONTRAST TECHNIQUE: Multidetector CT imaging of the chest, abdomen and pelvis was performed following the standard protocol during bolus administration of intravenous contrast. CONTRAST:  OMNIPAQUE IOHEXOL 300 MG/ML  SOLN COMPARISON:  CT cervical spine today reported separately. CTA chest 09/01/2008. FINDINGS: CT CHEST FINDINGS Cardiovascular: Thoracic aorta appears intact with mild atherosclerosis. No cardiomegaly or pericardial effusion. Other central mediastinal vascular structures appear patent and intact. Calcified carotid artery atherosclerosis on series 3, image 29. Mediastinum/Nodes: Negative. No mediastinal hematoma or lymphadenopathy. Enteric tube courses through the esophagus to the stomach. Lungs/Pleura: There is dependent and peribronchial patchy opacity in the right upper lobe. By report, the patient may have aspirated during intubation. Mild dependent opacity in the left upper lobe. Scattered additional subtle peribronchial ground-glass opacity in the right lung. No pneumothorax. No pleural effusion there is a small volume of pleural hematoma and/or adjacent pulmonary contusion in the left costophrenic angle adjacent to a mildly displaced left posterior 10th rib fracture (series 5, image 122). Musculoskeletal:  Acute on chronic mildly displaced posterolateral left 10th rib fracture. Nearby chronic left 9th rib fracture. No other acute rib fracture identified. Sternum intact. Thoracic vertebrae and visible shoulder osseous structures appear intact. CT ABDOMEN PELVIS FINDINGS Hepatobiliary: Moderate to severe hepatic steatosis. No superimposed liver injury identified. Negative gallbladder. Pancreas: Negative. 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 No splenic injury or perisplenic fluid. Adrenals/Urinary Tract: Normal adrenal glands. Bilateral renal enhancement is symmetric. Contrast excretion is symmetric and proximal ureters appear normal. No renal injury identified. Unremarkable urinary bladder. Stomach/Bowel: Mild sigmoid diverticulosis. Occasional diverticula in the ascending colon. Otherwise negative large bowel with diminutive or absent appendix. Negative terminal ileum. No dilated small bowel. Small fat containing umbilical hernia. Enteric tube terminates in the gastric fundus. Stomach and duodenum are within normal limits. No free air, free fluid. Vascular/Lymphatic: Intact abdominal aorta and other major arterial structures with mild calcified atherosclerosis. Portal venous system appears patent. No lymphadenopathy. Reproductive: Negative aside from small fat  containing inguinal hernias. Other: No pelvic free fluid. Musculoskeletal: No acute lumbar vertebral fracture. Sacral ala and SI joints appear intact. No pelvis or proximal femur fracture identified. There is some superficial soft tissue contusion along the left lower flank/buttock on series 3, image 96. No other superficial soft tissue injury identified. IMPRESSION: 1. Acute on chronic mildly displaced fracture of the left posterior 10th rib. Small volume adjacent pleural hematoma and/or pulmonary contusion in the left costophrenic angle. But no pneumothorax or pleural effusion. 2. Other pulmonary opacity - mostly dependent in the right  lung - is compatible with aspiration. 3. Superficial soft tissue contusion/hematoma over the left lower flank/buttock. No underlying fracture identified. 4. No other acute traumatic injury identified in the chest, abdomen, or pelvis. 5. Moderate to severe hepatic steatosis. This exam was preliminarily reviewed in person with Trauma Surgery Dr. Bobbye Morton on 06/27/2019 at 1646 hours. Electronically Signed   By: Genevie Ann M.D.   On: 06/30/2019 17:30   DG Pelvis Portable  Result Date: 07/04/2019 CLINICAL DATA:  Level 1 trauma. Patient fell down stairs. Unresponsive. EXAM: PORTABLE PELVIS 1-2 VIEWS COMPARISON:  None. FINDINGS: 1606 hours. This portable study excludes the iliac crests and portions of the proximal right femur. The mineralization and alignment are normal. There is no evidence of acute fracture or dislocation. The symphysis pubis and sacroiliac joints appear intact. Various lines project over the pelvis. IMPRESSION: No evidence of acute pelvic injury on this single view. Electronically Signed   By: Richardean Sale M.D.   On: 07/17/2019 16:23   DG Chest Port 1 View  Result Date: 07/22/2019 CLINICAL DATA:  Respiratory failure EXAM: PORTABLE CHEST 1 VIEW COMPARISON:  Chest radiograph 07/13/2019 FINDINGS: ET tube mid trachea. Enteric tube tip and side-port project over the stomach. Stable cardiac and mediastinal contours. Low lung volumes. No large area pulmonary consolidation. No pleural effusion or pneumothorax. IMPRESSION: No acute cardiopulmonary process. ET tube mid trachea. Electronically Signed   By: Lovey Newcomer M.D.   On: 07/22/2019 06:49   DG Chest Port 1 View  Result Date: 07/06/2019 CLINICAL DATA:  Level 1 trauma. Patient fell down stairs. Unresponsive with bleeding from ears. EXAM: PORTABLE CHEST 1 VIEW COMPARISON:  Radiographs 03/09/2017 and 05/22/2015. FINDINGS: 1605 hours. Tip of the endotracheal tube is in the mid trachea. An enteric tube projects below the diaphragm with tip in the left  subdiaphragmatic region. The heart size and mediastinal contours are stable without evidence of mediastinal hematoma. Faint asymmetric density projecting over the right upper lobe could reflect mild contusion or atelectasis. The lungs are otherwise clear. There is no pleural effusion or pneumothorax. No acute fractures are seen. IMPRESSION: Satisfactory position of the endotracheal and enteric tubes. Possible mild right upper lobe contusion or atelectasis. Electronically Signed   By: Richardean Sale M.D.   On: 07/11/2019 16:22    ROS:ROS 12 systems reviewed and negative except as stated in HPI patient currently intubated and sedated  Blood pressure (!) 128/92, pulse (!) 124, temperature (!) 101.5 F (38.6 C), resp. rate (!) 23, height 5\' 6"  (1.676 m), weight 92.2 kg, SpO2 100 %.  PHYSICAL EXAM: General appearance - Patient with oral tracheal intubation, sedated.  Arousable to stimulus Eyes - Pupils are equal and reactive.  Unable to assess full range of eye mobility.  No evidence of entrapment. Ears -right ear normal, no evidence of hemotympanum or trauma.  Left ear shows bloody crusting in the lateral ear canal, laceration of the anterior aspect of the  ear canal without active bleeding.  Tympanic membrane's intact with hemotympanum Nose -bloody crusting in the nasal passageway.  No evidence of septal deviation or septal hematoma. Patient orally intubated, no evidence of intraoral trauma or laceration, no bleeding.  Airway stable. Difficult to assess patient's facial movement with oral tracheal tube, bandage and tape.  Monitor for facial nerve dysfunction on the left side.  Studies Reviewed: Head CT scan reviewed in detail shows complex skull fractures primarily involving the left lateral and occipital skull.  The patient's temporal bone is relatively intact with possible nondisplaced longitudinal fracture.  Minimally displaced fractures in the skull surrounding the patient's temporal bone.  Soft  tissue/blood in the left middle ear space.  Mastoid intact with minimal fluid.  Patient also has soft tissue density consistent with blood in all sinuses, particular the sphenoid sinus bilaterally.  Assessment/Plan: Patient with severe head injury after falling down a flight of stairs.  He has complex skull fractures involving primarily the left and occipital region.  Significant intracranial injuries.  Left temporal bone is essentially intact with skull fracture surrounding the area.  The patient may have a longitudinal temporal bone fracture with blood in the middle ear space and a small laceration in the ear canal.  Will order Ciprodex drops in the left ear twice daily throughout the patient's hospitalization.  Plan recheck in several days to assess laceration and ear canal healing.  No acute intervention required.  Continue current neurosurgical and critical care support.  Long-term the patient will need an audiogram to assess underlying hearing loss once middle ear effusion has resolved.  Monitor facial nerve function.  Osborn Coho 07/22/2019, 9:38 AM

## 2019-07-23 ENCOUNTER — Inpatient Hospital Stay (HOSPITAL_COMMUNITY): Payer: Self-pay

## 2019-07-23 LAB — BASIC METABOLIC PANEL
Anion gap: 9 (ref 5–15)
BUN: 13 mg/dL (ref 6–20)
CO2: 23 mmol/L (ref 22–32)
Calcium: 7.7 mg/dL — ABNORMAL LOW (ref 8.9–10.3)
Chloride: 105 mmol/L (ref 98–111)
Creatinine, Ser: 0.9 mg/dL (ref 0.61–1.24)
GFR calc Af Amer: >60 mL/min (ref >60)
GFR calc non Af Amer: >60 mL/min (ref >60)
Glucose, Bld: 154 mg/dL — ABNORMAL HIGH (ref 70–99)
Potassium: 4.1 mmol/L (ref 3.5–5.1)
Sodium: 137 mmol/L (ref 135–145)

## 2019-07-23 LAB — CBC
HCT: 30.2 % — ABNORMAL LOW (ref 39.0–52.0)
Hemoglobin: 10 g/dL — ABNORMAL LOW (ref 13.0–17.0)
MCH: 34.4 pg — ABNORMAL HIGH (ref 26.0–34.0)
MCHC: 33.1 g/dL (ref 30.0–36.0)
MCV: 103.8 fL — ABNORMAL HIGH (ref 80.0–100.0)
Platelets: 93 10*3/uL — ABNORMAL LOW (ref 150–400)
RBC: 2.91 MIL/uL — ABNORMAL LOW (ref 4.22–5.81)
RDW: 11.7 % (ref 11.5–15.5)
WBC: 11.6 10*3/uL — ABNORMAL HIGH (ref 4.0–10.5)
nRBC: 0 % (ref 0.0–0.2)

## 2019-07-23 LAB — GLUCOSE, CAPILLARY
Glucose-Capillary: 121 mg/dL — ABNORMAL HIGH (ref 70–99)
Glucose-Capillary: 122 mg/dL — ABNORMAL HIGH (ref 70–99)
Glucose-Capillary: 123 mg/dL — ABNORMAL HIGH (ref 70–99)
Glucose-Capillary: 140 mg/dL — ABNORMAL HIGH (ref 70–99)
Glucose-Capillary: 143 mg/dL — ABNORMAL HIGH (ref 70–99)
Glucose-Capillary: 174 mg/dL — ABNORMAL HIGH (ref 70–99)

## 2019-07-23 LAB — TRIGLYCERIDES: Triglycerides: 165 mg/dL — ABNORMAL HIGH (ref <150)

## 2019-07-23 LAB — PHOSPHORUS: Phosphorus: 2.4 mg/dL — ABNORMAL LOW (ref 2.5–4.6)

## 2019-07-23 LAB — MAGNESIUM: Magnesium: 2 mg/dL (ref 1.7–2.4)

## 2019-07-23 LAB — PHENYTOIN LEVEL, TOTAL: Phenytoin Lvl: <2.5 ug/mL — ABNORMAL LOW (ref 10.0–20.0)

## 2019-07-23 MED ORDER — SODIUM CHLORIDE 0.9 % IV SOLN
1500.0000 mg | Freq: Once | INTRAVENOUS | Status: AC
  Administered 2019-07-23: 19:00:00 1500 mg via INTRAVENOUS
  Filled 2019-07-23 (×2): qty 30

## 2019-07-23 MED ORDER — SODIUM CHLORIDE 0.9 % IV SOLN
200.0000 mg | Freq: Once | INTRAVENOUS | Status: AC
  Administered 2019-07-24: 200 mg via INTRAVENOUS
  Filled 2019-07-23: qty 20

## 2019-07-23 MED ORDER — VANCOMYCIN HCL 1500 MG/300ML IV SOLN
1500.0000 mg | Freq: Once | INTRAVENOUS | Status: AC
  Administered 2019-07-24: 1500 mg via INTRAVENOUS
  Filled 2019-07-23: qty 300

## 2019-07-23 MED ORDER — LEVETIRACETAM IN NACL 1500 MG/100ML IV SOLN
1500.0000 mg | INTRAVENOUS | Status: AC
  Administered 2019-07-23: 18:00:00 1500 mg via INTRAVENOUS
  Filled 2019-07-23: qty 100

## 2019-07-23 MED ORDER — SODIUM CHLORIDE 0.9 % IV SOLN
100.0000 mg | Freq: Two times a day (BID) | INTRAVENOUS | Status: DC
  Administered 2019-07-24 – 2019-07-29 (×12): 100 mg via INTRAVENOUS
  Filled 2019-07-23 (×14): qty 10

## 2019-07-23 MED ORDER — LEVETIRACETAM IN NACL 1500 MG/100ML IV SOLN
1500.0000 mg | Freq: Two times a day (BID) | INTRAVENOUS | Status: DC
  Administered 2019-07-24 – 2019-07-29 (×12): 1500 mg via INTRAVENOUS
  Filled 2019-07-23 (×12): qty 100

## 2019-07-23 MED ORDER — PIPERACILLIN-TAZOBACTAM 3.375 G IVPB
3.3750 g | Freq: Three times a day (TID) | INTRAVENOUS | Status: DC
  Administered 2019-07-24 – 2019-07-26 (×8): 3.375 g via INTRAVENOUS
  Filled 2019-07-23 (×8): qty 50

## 2019-07-23 MED ORDER — VANCOMYCIN HCL IN DEXTROSE 1-5 GM/200ML-% IV SOLN
1000.0000 mg | Freq: Two times a day (BID) | INTRAVENOUS | Status: DC
  Administered 2019-07-24 – 2019-07-26 (×5): 1000 mg via INTRAVENOUS
  Filled 2019-07-23 (×5): qty 200

## 2019-07-23 MED ORDER — LEVETIRACETAM IN NACL 1000 MG/100ML IV SOLN: 1000.0000 mg | Freq: Two times a day (BID) | INTRAVENOUS | Status: DC

## 2019-07-23 MED ORDER — THIAMINE HCL 100 MG/ML IJ SOLN
250.0000 mg | Freq: Every day | INTRAVENOUS | Status: DC
  Administered 2019-07-26 – 2019-07-28 (×2): 250 mg via INTRAVENOUS
  Filled 2019-07-23 (×5): qty 2.5

## 2019-07-23 MED ORDER — PHENYTOIN SODIUM 50 MG/ML IJ SOLN
100.0000 mg | Freq: Three times a day (TID) | INTRAMUSCULAR | Status: DC
  Administered 2019-07-24 – 2019-07-29 (×17): 100 mg via INTRAVENOUS
  Filled 2019-07-23 (×19): qty 2

## 2019-07-23 MED ORDER — THIAMINE HCL 100 MG/ML IJ SOLN
500.0000 mg | Freq: Three times a day (TID) | INTRAVENOUS | Status: AC
  Administered 2019-07-23 – 2019-07-25 (×8): 500 mg via INTRAVENOUS
  Filled 2019-07-23 (×9): qty 5

## 2019-07-23 NOTE — Progress Notes (Signed)
Patient ID: Eddie Torres, male   DOB: 02-Sep-1960, 59 y.o.   MRN: [id number] Discussed with neurology, in status which they are treating, sending cultures, will start empiric vanc and zosyn (? Aspiration) due to fevers just in case

## 2019-07-23 NOTE — Progress Notes (Addendum)
Subjective/Chief Complaint: Not very responsive this am    Objective: Vital signs in last 24 hours: Temp:  [100.9 F (38.3 C)-101.7 F (38.7 C)] 100.9 F (38.3 C) (01/31 0800) Pulse Rate:  [76-124] 81 (01/31 0800) Resp:  [13-24] 24 (01/31 0800) BP: (85-155)/(59-107) 125/82 (01/31 0800) SpO2:  [99 %-100 %] 100 % (01/31 0800) FiO2 (%):  [40 %] 40 % (01/31 0735) Last BM Date: (PTA)  Intake/Output from previous day: 01/30 0701 - 01/31 0700 In: 3053.4 [I.V.:2803.6; IV Piggyback:249.8] Out: 1050 [Urine:850; Emesis/NG output:200] Intake/Output this shift: No intake/output data recorded.  General: on vent Neuro: pupils 3mm, not responsive this am HEENT/Neck: ETT and collar Resp: clear to auscultation bilaterally CV: RRR GI: soft, quiet, some distention Extremities: no edema, no erythema, pulses WNL  Lab Results:  Recent Labs    07/22/19 0235 07/22/19 0235 07/22/19 0408 07/23/19 0239  WBC 16.8*  --   --  11.6*  HGB 12.1*   < > 11.6* 10.0*  HCT 35.4*   < > 34.0* 30.2*  PLT 155  --   --  93*   < > = values in this interval not displayed.   BMET Recent Labs    07/22/19 0235 07/22/19 0235 07/22/19 0408 07/23/19 0239  NA 136   < > 137 137  K 3.7   < > 4.3 4.1  CL 101  --   --  105  CO2 24  --   --  23  GLUCOSE 144*  --   --  154*  BUN 11  --   --  13  CREATININE 0.81  --   --  0.90  CALCIUM 7.7*  --   --  7.7*   < > = values in this interval not displayed.   PT/INR Recent Labs    11/24/2019 1559  LABPROT 12.1  INR 0.9   ABG Recent Labs    11/24/2019 1656 07/22/19 0408  PHART 7.264* 7.368  HCO3 22.8 23.3    Studies/Results: CT HEAD WO CONTRAST  Addendum Date: 2020/05/01   ADDENDUM REPORT: 02021/11/10 23:23 ADDENDUM: Trace amount posterior fossa subarachnoid hemorrhage particularly along the right cerebellar hemisphere is also unchanged from prior. Initial findings and addendum were called by telephone on 2020/05/01 at 11:23 pm to provider Cindra PresumeVINCENT  COSTELLA , who verbally acknowledged these results. Electronically Signed   By: Kreg ShropshirePrice  DeHay M.D.   On: 02021/11/10 23:23   Result Date: 2020/05/01 CLINICAL DATA:  Subarachnoid hemorrhage follow-up EXAM: CT HEAD WITHOUT CONTRAST TECHNIQUE: Contiguous axial images were obtained from the base of the skull through the vertex without intravenous contrast. COMPARISON:  CT head 02021/11/10 FINDINGS: Brain: Enlarging size of a right frontal hemorrhagic contusion measuring 1.8 x 2.8 cm in size (2/17) with some additional a developing a parenchymal contusion laterally. Persistent pneumocephaly along the left frontal convexity and anterior falx. Large left subdural hematoma measuring up to 5 mm in maximal thickness. Additional subdural hemorrhage collecting over the right anterior frontal convexity also measuring up to 5 mm in thickness. Extensive of parafalcine subdural hemorrhage is noted as well measuring up to 6 mm in thickness anteriorly. Fluid is again seen layering along the left bowels and left tentorium. Though some small amount of hemorrhage is seen along the medial right tentorial leaflet as well. Extensive bilateral subarachnoid hemorrhages extending across the frontotemporal parietal regions in sylvian fissures, right slightly greater than left, slightly increasing in the right from comparison study. There is trace hemorrhage in the occipital horns  of the lateral ventricles, new from prior. No midline shift. Basilar cisterns remain patent. No superimposed areas of cortical infarct. Vascular: Atherosclerotic calcification of the carotid siphons. No hyperdense vessel. Skull: Complex left temporal bone fracture with diastasis of the petrous quite most ule suture. With additional fracture adjacent the lambdoid suture as well. Extensive overlying scalp hematoma with skin staples and bandaging material seen posteriorly. There is a fracture through the left sphenoid wall and curvilinear fracture through the plane of  sphenoidale P right lateral wall sphenoid fracture is also suspected (3/27). Sinuses/Orbits: Extensive ethmoidal and sphenoidal hemosinus. Minimal mural thickening in the maxillary sinuses. Frontal sinuses demonstrate some layering pneumatized secretions, possibly additional hemorrhage. Left mastoid middle ear opacification compatible with hemorrhage. Right middle ear cavity and mastoids remain patent. No retro septal stranding or gas seen within the orbits. Other: None IMPRESSION: Extensive multifocal posttraumatic intracranial hemorrhage with enlarging areas of hemorrhagic parenchymal contusion in the right frontal lobe, increasing size of a right frontotemporal subarachnoid hemorrhages, new hemorrhage along the right tentorial leaflet and developing intraventricular hemorrhage in the occipital horns. Redemonstration of complex left temporal bone fracture with adjacent suture all diastasis and cephalad extension to the lambdoid suture. Small amount of pneumocephaly along the left frontal convexity. Skull base fractures at the level of the planum sphenoidale and sphenoid sinuses which extend across the cavernous sinuses. Caution for intra cavernous injury is recommended, and if expected, CTA and CTV could be considered. Small amount of anterior right parafalcine pneumocephaly as well. No associated midline shift or ventriculomegaly at this time. Basilar cisterns are patent. Currently attempting to contact the ordering provider with a critical value result. Addendum will be submitted upon case discussion. Electronically Signed: By: Kreg Shropshire M.D. On: 06/25/2019 23:07   CT HEAD WO CONTRAST  Result Date: 07/14/2019 CLINICAL DATA:  59 year old male status post unwitnessed fall down approximately 10 stairs. GCS of 3, left arm flaccid. Laceration to posterior head, blood from left ear. EXAM: CT HEAD WITHOUT CONTRAST TECHNIQUE: Contiguous axial images were obtained from the base of the skull through the vertex  without intravenous contrast. COMPARISON:  Head CT 05/08/2017. FINDINGS: Brain: Scattered small volume pneumocephalus, Including along the anterior and posterior clinoid processes. Broad-based left side subdural hematoma measures 5-6 millimeters in thickness. Superimposed right anterior frontal convexity subdural hematoma measures 5 millimeters in thickness. Superimposed subdural blood along the falx and left tentorium. Superimposed 2 centimeter hemorrhagic contusion of the right anterior inferior frontal gyrus on series 3, image 21. Superimposed bilateral subarachnoid hemorrhage in the anterior cranial fossa, sylvian fissures right greater than left. There is also a small volume of subarachnoid hemorrhage in the left lateral occipital and posterior temporal sulci. No intraventricular hemorrhage or ventriculomegaly. No midline shift at this time. Basilar cisterns remain patent. No superimposed acute cortically based infarct. Vascular: Calcified atherosclerosis at the skull base. Small volume pneumocephalus Skull: Complex fracture at the left temporal bone (series 4, image 27 with both transverse and longitudinal components. There is associated diastasis of the adjacent suture (petro-squamosal suture) on series 4, image 25, and a linear nondisplaced fracture tracking cephalad along the left lambdoid suture as seen on series 4, image 36. Additionally, a nondisplaced fracture through the left lateral wall of the sphenoid sinus is suspected on series 4, image 27 anterior to the left carotid canal. There is also suspicion of a nondisplaced curvilinear skull fracture through the sphenoid planum on series 4, image 35. Possible also right lateral sphenoid sinus wall fracture on image 30.  Sinuses/Orbits: Opacified left middle ear and mastoid air cells likely due to hemorrhage. The left-side ossicles appear to remain aligned. Superimposed fractures as stated above. Right tympanic cavities and mastoids are clear. Hemorrhage  layering in the sphenoid sinuses, and trace blood or fluid scattered in the bilateral ethmoid and maxillary sinuses. Frontal sinuses remain relatively clear. Other: Broad-based left posterior convexity scalp hematoma. Skin staples in place. Orbits soft tissues appear to remain normal. IMPRESSION: 1. Multifocal acute posttraumatic intracranial hemorrhage: - broad-based left subdural hematoma, 5-6 mm. - right anterior frontal convexity subdural hematoma, 5 mm. - 20 mm right anterior frontal lobe hemorrhagic contusion. - scattered bilateral subarachnoid hemorrhage. - para falcine subdural blood. 2. Complex left temporal bone fracture associated with diastasis of the adjacent suture and a linear nondisplaced fracture tracking cephalad along the left lambdoid suture. 3. Subtle nondisplaced central skull base fractures are suspected at the sphenoid planum and lateral walls of both sphenoid sinuses. 4. Associated small volume pneumocephalus. Associated hemorrhage in the sphenoid sinuses, left middle ear and mastoid. 5. No midline shift or ventriculomegaly. Basilar cisterns remain patent at this time. 6. Broad-based left posterior convexity scalp hematoma. Salient findings on this exam were initially reviewed in person with Trauma Surgery Dr. Bedelia PersonLovick On 08/11/19 at 1639 hours. Electronically Signed   By: Odessa FlemingH  Hall M.D.   On: 002/19/21 17:10   CT CHEST W CONTRAST  Result Date: 08/11/19 CLINICAL DATA:  59 year old male status post unwitnessed fall down approximately 10 stairs. GCS of 3, left arm flaccid. Laceration to posterior head, blood from left ear. EXAM: CT CHEST, ABDOMEN, AND PELVIS WITH CONTRAST TECHNIQUE: Multidetector CT imaging of the chest, abdomen and pelvis was performed following the standard protocol during bolus administration of intravenous contrast. CONTRAST:  100mL OMNIPAQUE IOHEXOL 300 MG/ML  SOLN COMPARISON:  CT cervical spine today reported separately. CTA chest 09/01/2008. FINDINGS: CT CHEST  FINDINGS Cardiovascular: Thoracic aorta appears intact with mild atherosclerosis. No cardiomegaly or pericardial effusion. Other central mediastinal vascular structures appear patent and intact. Calcified carotid artery atherosclerosis on series 3, image 29. Mediastinum/Nodes: Negative. No mediastinal hematoma or lymphadenopathy. Enteric tube courses through the esophagus to the stomach. Lungs/Pleura: There is dependent and peribronchial patchy opacity in the right upper lobe. By report, the patient may have aspirated during intubation. Mild dependent opacity in the left upper lobe. Scattered additional subtle peribronchial ground-glass opacity in the right lung. No pneumothorax. No pleural effusion there is a small volume of pleural hematoma and/or adjacent pulmonary contusion in the left costophrenic angle adjacent to a mildly displaced left posterior 10th rib fracture (series 5, image 122). Musculoskeletal: Acute on chronic mildly displaced posterolateral left 10th rib fracture. Nearby chronic left 9th rib fracture. No other acute rib fracture identified. Sternum intact. Thoracic vertebrae and visible shoulder osseous structures appear intact. CT ABDOMEN PELVIS FINDINGS Hepatobiliary: Moderate to severe hepatic steatosis. No superimposed liver injury identified. Negative gallbladder. Pancreas: Negative. 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 No splenic injury or perisplenic fluid. Adrenals/Urinary Tract: Normal adrenal glands. Bilateral renal enhancement is symmetric. Contrast excretion is symmetric and proximal ureters appear normal. No renal injury identified. Unremarkable urinary bladder. Stomach/Bowel: Mild sigmoid diverticulosis. Occasional diverticula in the ascending colon. Otherwise negative large bowel with diminutive or absent appendix. Negative terminal ileum. No dilated small bowel. Small fat containing umbilical hernia. Enteric tube terminates in the gastric fundus. Stomach and  duodenum are within normal limits. No free air, free fluid. Vascular/Lymphatic: Intact abdominal aorta and other major arterial structures  with mild calcified atherosclerosis. Portal venous system appears patent. No lymphadenopathy. Reproductive: Negative aside from small fat containing inguinal hernias. Other: No pelvic free fluid. Musculoskeletal: No acute lumbar vertebral fracture. Sacral ala and SI joints appear intact. No pelvis or proximal femur fracture identified. There is some superficial soft tissue contusion along the left lower flank/buttock on series 3, image 96. No other superficial soft tissue injury identified. IMPRESSION: 1. Acute on chronic mildly displaced fracture of the left posterior 10th rib. Small volume adjacent pleural hematoma and/or pulmonary contusion in the left costophrenic angle. But no pneumothorax or pleural effusion. 2. Other pulmonary opacity - mostly dependent in the right lung - is compatible with aspiration. 3. Superficial soft tissue contusion/hematoma over the left lower flank/buttock. No underlying fracture identified. 4. No other acute traumatic injury identified in the chest, abdomen, or pelvis. 5. Moderate to severe hepatic steatosis. This exam was preliminarily reviewed in person with Trauma Surgery Dr. Bobbye Morton on 07/02/2019 at 1646 hours. Electronically Signed   By: Genevie Ann M.D.   On: 06/26/2019 17:30   CT CERVICAL SPINE WO CONTRAST  Result Date: 07/18/2019 CLINICAL DATA:  59 year old male status post unwitnessed fall down approximately 10 stairs. GCS of 3, left arm flaccid. Laceration to posterior head. Blood from left ear. EXAM: CT CERVICAL SPINE WITHOUT CONTRAST TECHNIQUE: Multidetector CT imaging of the cervical spine was performed without intravenous contrast. Multiplanar CT image reconstructions were also generated. COMPARISON:  Head CT today reported separately. Cervical spine CT 05/22/2015. FINDINGS: Alignment: Mildly improved cervical lordosis since 2016.  Cervicothoracic junction alignment is within normal limits. Bilateral posterior element alignment is within normal limits. Skull base and vertebrae: Partially visible left skull base fractures. The occipital condyles remain intact. Normal craniocervical alignment. C1 and C2 appear intact and normally aligned. No cervical vertebral fracture is identified. Soft tissues and spinal canal: No prevertebral fluid or swelling. No visible canal hematoma. Fluid in the pharynx. Visible endotracheal and enteric tubes appear appropriately position. No discrete neck soft tissue injury identified. Disc levels: Advanced lower cervical spine disc, endplate degeneration. Widespread cervical posterior element degeneration. Lucency at the left T1 posterior elements, at the inferior C7-T1 facet joint on sagittal image 39 is increased since 2016, but more resembles a degenerative subchondral cyst than a fracture (coronal image 31). Up to mild degenerative cervical spinal stenosis is possible at C6-C7. Upper chest: No definite upper thoracic fracture identified, left T1 posterior element lucency described above. Patchy opacity in the upper lungs greater on the right, see comparison chest CT. IMPRESSION: 1.  No acute traumatic injury identified in the cervical spine. 2. Progressed lower cervical and upper thoracic spine degeneration since a 2016 CT. Suspect a degenerative subchondral cyst rather than fracture of the left T1 pars (sagittal image 39). 3. See also CT Chest today are reported separately. Preliminary findings on this exam were initially reviewed in person with Trauma Surgery Dr. Bobbye Morton On 06/23/2019 at 1641 hours. Electronically Signed   By: Genevie Ann M.D.   On: 07/14/2019 17:18   CT ABDOMEN PELVIS W CONTRAST  Result Date: 07/17/2019 CLINICAL DATA:  59 year old male status post unwitnessed fall down approximately 10 stairs. GCS of 3, left arm flaccid. Laceration to posterior head, blood from left ear. EXAM: CT CHEST, ABDOMEN,  AND PELVIS WITH CONTRAST TECHNIQUE: Multidetector CT imaging of the chest, abdomen and pelvis was performed following the standard protocol during bolus administration of intravenous contrast. CONTRAST:  154mL OMNIPAQUE IOHEXOL 300 MG/ML  SOLN COMPARISON:  CT cervical  spine today reported separately. CTA chest 09/01/2008. FINDINGS: CT CHEST FINDINGS Cardiovascular: Thoracic aorta appears intact with mild atherosclerosis. No cardiomegaly or pericardial effusion. Other central mediastinal vascular structures appear patent and intact. Calcified carotid artery atherosclerosis on series 3, image 29. Mediastinum/Nodes: Negative. No mediastinal hematoma or lymphadenopathy. Enteric tube courses through the esophagus to the stomach. Lungs/Pleura: There is dependent and peribronchial patchy opacity in the right upper lobe. By report, the patient may have aspirated during intubation. Mild dependent opacity in the left upper lobe. Scattered additional subtle peribronchial ground-glass opacity in the right lung. No pneumothorax. No pleural effusion there is a small volume of pleural hematoma and/or adjacent pulmonary contusion in the left costophrenic angle adjacent to a mildly displaced left posterior 10th rib fracture (series 5, image 122). Musculoskeletal: Acute on chronic mildly displaced posterolateral left 10th rib fracture. Nearby chronic left 9th rib fracture. No other acute rib fracture identified. Sternum intact. Thoracic vertebrae and visible shoulder osseous structures appear intact. CT ABDOMEN PELVIS FINDINGS Hepatobiliary: Moderate to severe hepatic steatosis. No superimposed liver injury identified. Negative gallbladder. Pancreas: Negative. 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 No splenic injury or perisplenic fluid. Adrenals/Urinary Tract: Normal adrenal glands. Bilateral renal enhancement is symmetric. Contrast excretion is symmetric and proximal ureters appear normal. No renal injury  identified. Unremarkable urinary bladder. Stomach/Bowel: Mild sigmoid diverticulosis. Occasional diverticula in the ascending colon. Otherwise negative large bowel with diminutive or absent appendix. Negative terminal ileum. No dilated small bowel. Small fat containing umbilical hernia. Enteric tube terminates in the gastric fundus. Stomach and duodenum are within normal limits. No free air, free fluid. Vascular/Lymphatic: Intact abdominal aorta and other major arterial structures with mild calcified atherosclerosis. Portal venous system appears patent. No lymphadenopathy. Reproductive: Negative aside from small fat containing inguinal hernias. Other: No pelvic free fluid. Musculoskeletal: No acute lumbar vertebral fracture. Sacral ala and SI joints appear intact. No pelvis or proximal femur fracture identified. There is some superficial soft tissue contusion along the left lower flank/buttock on series 3, image 96. No other superficial soft tissue injury identified. IMPRESSION: 1. Acute on chronic mildly displaced fracture of the left posterior 10th rib. Small volume adjacent pleural hematoma and/or pulmonary contusion in the left costophrenic angle. But no pneumothorax or pleural effusion. 2. Other pulmonary opacity - mostly dependent in the right lung - is compatible with aspiration. 3. Superficial soft tissue contusion/hematoma over the left lower flank/buttock. No underlying fracture identified. 4. No other acute traumatic injury identified in the chest, abdomen, or pelvis. 5. Moderate to severe hepatic steatosis. This exam was preliminarily reviewed in person with Trauma Surgery Dr. Bedelia Person on 08/11/2019 at 1646 hours. Electronically Signed   By: Odessa Fleming M.D.   On: 08-11-19 17:30   DG Pelvis Portable  Result Date: 2019/08/11 CLINICAL DATA:  Level 1 trauma. Patient fell down stairs. Unresponsive. EXAM: PORTABLE PELVIS 1-2 VIEWS COMPARISON:  None. FINDINGS: 1606 hours. This portable study excludes the iliac  crests and portions of the proximal right femur. The mineralization and alignment are normal. There is no evidence of acute fracture or dislocation. The symphysis pubis and sacroiliac joints appear intact. Various lines project over the pelvis. IMPRESSION: No evidence of acute pelvic injury on this single view. Electronically Signed   By: Carey Bullocks M.D.   On: 08-11-19 16:23   DG CHEST PORT 1 VIEW  Result Date: 07/23/2019 CLINICAL DATA:  Ventilator support.  Respiratory failure. EXAM: PORTABLE CHEST 1 VIEW COMPARISON:  07/22/2019 FINDINGS: Endotracheal tube tip  is 4 cm above the carina. Orogastric or nasogastric tube enters the stomach. The lungs are clear except for minimal left base atelectasis. No pneumothorax or lobar collapse. Left tenth rib fracture as seen previously. IMPRESSION: Endotracheal tube and orogastric/nasogastric tube well positioned. Left tenth rib fracture. Mild left base atelectasis or contusion. Electronically Signed   By: Paulina Fusi M.D.   On: 07/23/2019 07:31   DG Chest Port 1 View  Result Date: 07/22/2019 CLINICAL DATA:  Respiratory failure EXAM: PORTABLE CHEST 1 VIEW COMPARISON:  Chest radiograph 07/22/2019 FINDINGS: ET tube mid trachea. Enteric tube tip and side-port project over the stomach. Stable cardiac and mediastinal contours. Low lung volumes. No large area pulmonary consolidation. No pleural effusion or pneumothorax. IMPRESSION: No acute cardiopulmonary process. ET tube mid trachea. Electronically Signed   By: Annia Belt M.D.   On: 07/22/2019 06:49   DG Chest Port 1 View  Result Date: 07/22/2019 CLINICAL DATA:  Level 1 trauma. Patient fell down stairs. Unresponsive with bleeding from ears. EXAM: PORTABLE CHEST 1 VIEW COMPARISON:  Radiographs 03/09/2017 and 05/22/2015. FINDINGS: 1605 hours. Tip of the endotracheal tube is in the mid trachea. An enteric tube projects below the diaphragm with tip in the left subdiaphragmatic region. The heart size and mediastinal  contours are stable without evidence of mediastinal hematoma. Faint asymmetric density projecting over the right upper lobe could reflect mild contusion or atelectasis. The lungs are otherwise clear. There is no pleural effusion or pneumothorax. No acute fractures are seen. IMPRESSION: Satisfactory position of the endotracheal and enteric tubes. Possible mild right upper lobe contusion or atelectasis. Electronically Signed   By: Carey Bullocks M.D.   On: 07-22-2019 16:22    Anti-infectives: Anti-infectives (From admission, onward)   Start     Dose/Rate Route Frequency Ordered Stop   July 22, 2019 1645  ceFAZolin (ANCEF) IVPB 2g/100 mL premix     2 g 200 mL/hr over 30 Minutes Intravenous  Once 07/22/19 1630 Jul 22, 2019 1717      Assessment/Plan: Fall down 10 stairs BL SDH/SAH, Large R frontal contusion, Tentorial and falcine SDH- per Dr. Conchita Paris, has head ct stat pending for decreased neuro status L occipital skull fx, L tem Temporal bone fx with blood in L middle ear - Dr Annalee Genta following Acute hypoxic ventilator dependent respiratory failure - wean as tolerated, not much to do due to neuro status right now Pneumocephalus  - abx Scalp laceration - stapled in ED Possible T1 fracture - keep in collar for now, per NS L 10th rib fracture, old rib fractures L 9-10 - no PTX Aspiration  HTN EtOH abuse - CIWA, 354 on admit, Precedex as some of this agitation may be ETOH WD Bipolar disorder FEN - some ileus so no TF yet. klon/sero, Na 137 VTE - PAS, no pharm prophylaxis  Dispo - ICU Critical Care Total Time*: 33 Minutes  Addendum: I have discussed this with both of his daughters today  Emelia Loron 07/23/2019

## 2019-07-23 NOTE — Progress Notes (Signed)
Pharmacy Antibiotic Note  Eddie Torres is a 59 y.o. male admitted on 08/10/19 with trauma.  Pharmacy has been consulted for Vancomycin/Zosyn dosing. WBC increased. Renal function ok. Fever with unknown source, ?aspiration. Pt also in status epi.   Plan: Vancomycin 1500 mg IV x 1, then 1000 mg IV q12h >>Estimated AUC :534 Zosyn 3.375G IV q8h to be infused over 4 hours Trend WBC, temp, renal function  F/U infectious work-up Drug levels as indicated   Height: 5\' 6"  (167.6 cm) Weight: 203 lb 4.2 oz (92.2 kg) IBW/kg (Calculated) : 63.8  Temp (24hrs), Avg:101 F (38.3 C), Min:100.6 F (38.1 C), Max:101.5 F (38.6 C)  Recent Labs  Lab 10-Aug-2019 1559 08/10/19 1611 07/22/19 0235 07/23/19 0239  WBC 8.6  --  16.8* 11.6*  CREATININE 0.94 1.30* 0.81 0.90  LATICACIDVEN 2.8*  --   --   --     Estimated Creatinine Clearance: 92.8 mL/min (by C-G formula based on SCr of 0.9 mg/dL).    Allergies  Allergen Reactions  . Shellfish-Derived Products Anaphylaxis and Shortness Of Breath   07/25/19, PharmD, BCPS Clinical Pharmacist Phone: [phone]

## 2019-07-23 NOTE — Consult Note (Addendum)
NEURO HOSPITALIST CONSULT NOTE   Requesting physician: Dr. Kathyrn Sheriff   Reason for Consult:Seizure   History obtained from:  Chart  HPI:                                                                                                                                          Eddie Torres is an 59 y.o. male  With unknown PMH in the current chart (has another chart with PMH listed as HTN, Etoh abuse, Bipolar d/o) who presented to [facility] as a trauma after a fall down 10 steps. Has a h/o Etoh abuse  Per chart review: EMS reported that he was found minimally responsive after he fell. GCS: 8, blood in ears bilaterally. He was noted to be moving all extremities, but was not protecting airway  And was intubated.   Hospital course:  1/29: presented as a trauma s/p fall ETOH: 354 CTH: Multifocal acute posttraumatic intracranial hemorrhage: - broad-based left subdural hematoma, 5-6 mm. - right anterior frontal convexity subdural hematoma, 5 mm. - 20 mm right anterior frontal lobe hemorrhagic contusion. - scattered bilateral subarachnoid hemorrhage. - para falcine subdural blood. Complex left temporal bone fracture Trace amount posterior fossa subarachnoid hemorrhage particularly along the right cerebellar hemisphere is also unchanged from prior. 1/31: EEG ordered because of less responsiveness on exam.  Shows frequent right-sided seizures akin to status epilepticus.  Neurology consulted for seizure  CHY:IFOYDX stable hemorrhagic contusions, subrachnoid blood and subdural blood.  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 There is also profound diffuse encephalopathy, non specific to etiology.   Past medical history History reviewed. No pertinent past medical history. Patient unable to provide any history due to his mental status  Social history: Patient unable to  provide  Family history: Patient unable to provide.            Allergies  Allergen Reactions  . Shellfish-Derived Products Anaphylaxis and Shortness Of Breath    MEDICATIONS:                                                                                                                     Scheduled: . chlorhexidine gluconate (MEDLINE KIT)  15 mL Mouth Rinse  BID  . Chlorhexidine Gluconate Cloth  6 each Topical Daily  . ciprofloxacin-dexamethasone  4 drop Left EAR BID  . clonazePAM  0.5 mg Per Tube BID  . docusate  100 mg Per Tube Daily  . fentaNYL (SUBLIMAZE) injection  50 mcg Intravenous Once  . folic acid  1 mg Per Tube Daily  . mouth rinse  15 mL Mouth Rinse 10 times per day  . multivitamin  15 mL Per Tube Daily  . pantoprazole sodium  40 mg Per Tube Daily  . [START ON 07/24/2019] phenytoin (DILANTIN) IV  100 mg Intravenous Q8H  . QUEtiapine  50 mg Per Tube BID  . thiamine  100 mg Per Tube Daily   Continuous: . sodium chloride 100 mL/hr at 07/23/19 1057  . dexmedetomidine (PRECEDEX) IV infusion Stopped (07/22/19 1232)  . fentaNYL infusion INTRAVENOUS 25 mcg/hr (07/23/19 0900)  . fosPHENYtoin (CEREBYX) IV    . levETIRAcetam    . levETIRAcetam Stopped (07/23/19 0612)  . propofol (DIPRIVAN) infusion 5 mcg/kg/min (07/23/19 0900)   LEX:[id number] (TYLENOL) oral liquid 160 mg/5 mL, fentaNYL, fentaNYL (SUBLIMAZE) injection, hydrALAZINE, labetalol, ondansetron **OR** ondansetron (ZOFRAN) IV   ROS:                                                                                                                                        unobtainable from patient due to inutbated  Blood pressure 136/84, pulse 77, temperature (!) 100.8 F (38.2 C), resp. rate (!) 24, height 5' 6"  (1.676 m), weight 92.2 kg, SpO2 100 %.   General Examination:                                                                                                       Physical Exam  Constitutional: Appears  well-developed and well-nourished.  Psych: Affect appropriate to situation Eyes: Normal external eye and conjunctiva. HENT: Normocephalic, no lesions, without obvious abnormality.   Musculoskeletal-no joint tenderness, deformity or swelling Cardiovascular: Normal rate and regular rhythm.  Respiratory: Effort normal, non-labored breathing saturations WNL intubated GI: Soft.  No distension. There is no tenderness.  Skin: WD multiple abrasions  Neurological Examination Mental Status: Asleep, opens eyes to voice. Does not follow commands. Patient intubated and lightly sedated. Cranial Nerves: Patient squeezes eyes shut. Pupils appear 51m equally round a reactive.  Subconjunctival edema present. Face appears symmetric in presence of ETT, cough and gag intact.  Motor/ Sesnsory: Moves RUE, RLE and LUE to noxious. (bilateral wrist restraints), LLE  able to wiggle toes (not to command), but  Does not withdraw or move LLE to noxious or spontaneously.  Tone and bulk:normal tone throughout; no atrophy noted  Plantars: Right: downgoing   Left: downgoing Cerebellar: UTA Gait: UTA   Lab Results: Basic Metabolic Panel: Recent Labs  Lab 07/09/2019 1559 07/14/2019 1559 06/23/2019 1611 06/23/2019 1656 07/22/19 0235 07/22/19 0408 07/23/19 0239  NA 135   < > 136 135 136 137 137  K 3.5   < > 3.5 3.6 3.7 4.3 4.1  CL 100  --  102  --  101  --  105  CO2 20*  --   --   --  24  --  23  GLUCOSE 148*  --  145*  --  144*  --  154*  BUN 14  --  15  --  11  --  13  CREATININE 0.94  --  1.30*  --  0.81  --  0.90  CALCIUM 8.3*  --   --   --  7.7*  --  7.7*  MG  --   --   --   --  1.4*  --  2.0  PHOS  --   --   --   --  3.8  --  2.4*   < > = values in this interval not displayed.    CBC: Recent Labs  Lab 07/23/2019 1559 07/10/2019 1559 07/08/2019 1611 06/25/2019 1656 07/22/19 0235 07/22/19 0408 07/23/19 0239  WBC 8.6  --   --   --  16.8*  --  11.6*  HGB 15.1   < > 15.3 12.9* 12.1* 11.6* 10.0*  HCT 44.1   < >  45.0 38.0* 35.4* 34.0* 30.2*  MCV 100.2*  --   --   --  99.7  --  103.8*  PLT 169  --   --   --  155  --  93*   < > = values in this interval not displayed.   Lipid Panel: Recent Labs  Lab 07/22/19 0237 07/23/19 0239  TRIG 199* 165*    Imaging: CT HEAD WO CONTRAST  Result Date: 07/23/2019 CLINICAL DATA:  Follow-up traumatic brain injury. EXAM: CT HEAD WITHOUT CONTRAST TECHNIQUE: Contiguous axial images were obtained from the base of the skull through the vertex without intravenous contrast. COMPARISON:  07/14/2019. FINDINGS: Brain: In the posterior fossa, small amount of subarachnoid hemorrhage is present in the cerebellar folia and in the perimesencephalic cistern. In the supratentorial region, extensive hemorrhagic contusion of the right frontal lobe and right temporal tip. Amount of hemorrhage is fairly similar, perhaps minimally increased in the frontal region compared to the previous study. Regional edema is slightly more prominent. Subdural blood along the anterior aspect of the middle cranial fossa, anterior aspect of the anterior cranial fossa and tentorium appears stable, maximal thickness at the tentorium of 6 mm. On the left, hemorrhagic contusion to a lesser degree affects the frontal lobe and affects the temporal lobe posteriorly adjacent to the temporal bone. Increase in edema particularly in the posterior temporal contusion region. Subdural blood along the anterior and middle cranial fossa have not increased, maximal thickness 3 mm. There are newly seen foci of parenchymal low-density in both temporoparietal junction regions consistent with brain infarctions. Small amount of blood layering dependently in the occipital horns of the lateral ventricles. No sign of ventricular obstruction. No sign of brain herniation or midline shift. Vascular: Otherwise negative Skull: Skull base fracture at the posterior planum/roof of  the sphenoid sinus as present previously. Sinuses/Orbits: Blood  opacification of the sphenoid sinuses. Exophthalmos but without evidence of intraorbital injury. Other: None IMPRESSION: No evidence of much additional intracranial bleeding. Fairly stable patterns of extensive hemorrhagic contusions, subarachnoid blood and subdural blood. There may be slightly more intraparenchymal bleeding in the right frontal region. No evidence of herniation or midline shift. Newly seen focal low densities at the temporoparietal junctions bilaterally, favored to represent infarctions subsequent to vaso spasm. Case discussed with Dr. Kathyrn Sheriff at the time of interpretation. Electronically Signed   By: Nelson Chimes M.D.   On: 07/23/2019 09:24   CT HEAD WO CONTRAST  Addendum Date: 06/30/2019   ADDENDUM REPORT: 07/23/2019 23:23 ADDENDUM: Trace amount posterior fossa subarachnoid hemorrhage particularly along the right cerebellar hemisphere is also unchanged from prior. Initial findings and addendum were called by telephone on 06/25/2019 at 11:23 pm to provider Ferne Reus , who verbally acknowledged these results. Electronically Signed   By: Lovena Le M.D.   On: 07/09/2019 23:23   Result Date: 07/23/2019 CLINICAL DATA:  Subarachnoid hemorrhage follow-up EXAM: CT HEAD WITHOUT CONTRAST TECHNIQUE: Contiguous axial images were obtained from the base of the skull through the vertex without intravenous contrast. COMPARISON:  CT head 07/03/2019 FINDINGS: Brain: Enlarging size of a right frontal hemorrhagic contusion measuring 1.8 x 2.8 cm in size (2/17) with some additional a developing a parenchymal contusion laterally. Persistent pneumocephaly along the left frontal convexity and anterior falx. Large left subdural hematoma measuring up to 5 mm in maximal thickness. Additional subdural hemorrhage collecting over the right anterior frontal convexity also measuring up to 5 mm in thickness. Extensive of parafalcine subdural hemorrhage is noted as well measuring up to 6 mm in thickness  anteriorly. Fluid is again seen layering along the left bowels and left tentorium. Though some small amount of hemorrhage is seen along the medial right tentorial leaflet as well. Extensive bilateral subarachnoid hemorrhages extending across the frontotemporal parietal regions in sylvian fissures, right slightly greater than left, slightly increasing in the right from comparison study. There is trace hemorrhage in the occipital horns of the lateral ventricles, new from prior. No midline shift. Basilar cisterns remain patent. No superimposed areas of cortical infarct. Vascular: Atherosclerotic calcification of the carotid siphons. No hyperdense vessel. Skull: Complex left temporal bone fracture with diastasis of the petrous quite most ule suture. With additional fracture adjacent the lambdoid suture as well. Extensive overlying scalp hematoma with skin staples and bandaging material seen posteriorly. There is a fracture through the left sphenoid wall and curvilinear fracture through the plane of sphenoidale P right lateral wall sphenoid fracture is also suspected (3/27). Sinuses/Orbits: Extensive ethmoidal and sphenoidal hemosinus. Minimal mural thickening in the maxillary sinuses. Frontal sinuses demonstrate some layering pneumatized secretions, possibly additional hemorrhage. Left mastoid middle ear opacification compatible with hemorrhage. Right middle ear cavity and mastoids remain patent. No retro septal stranding or gas seen within the orbits. Other: None IMPRESSION: Extensive multifocal posttraumatic intracranial hemorrhage with enlarging areas of hemorrhagic parenchymal contusion in the right frontal lobe, increasing size of a right frontotemporal subarachnoid hemorrhages, new hemorrhage along the right tentorial leaflet and developing intraventricular hemorrhage in the occipital horns. Redemonstration of complex left temporal bone fracture with adjacent suture all diastasis and cephalad extension to the  lambdoid suture. Small amount of pneumocephaly along the left frontal convexity. Skull base fractures at the level of the planum sphenoidale and sphenoid sinuses which extend across the cavernous sinuses. Caution for intra cavernous injury is  recommended, and if expected, CTA and CTV could be considered. Small amount of anterior right parafalcine pneumocephaly as well. No associated midline shift or ventriculomegaly at this time. Basilar cisterns are patent. Currently attempting to contact the ordering provider with a critical value result. Addendum will be submitted upon case discussion. Electronically Signed: By: Lovena Le M.D. On: 06/26/2019 23:07   DG CHEST PORT 1 VIEW  Result Date: 07/23/2019 CLINICAL DATA:  Ventilator support.  Respiratory failure. EXAM: PORTABLE CHEST 1 VIEW COMPARISON:  07/22/2019 FINDINGS: Endotracheal tube tip is 4 cm above the carina. Orogastric or nasogastric tube enters the stomach. The lungs are clear except for minimal left base atelectasis. No pneumothorax or lobar collapse. Left tenth rib fracture as seen previously. IMPRESSION: Endotracheal tube and orogastric/nasogastric tube well positioned. Left tenth rib fracture. Mild left base atelectasis or contusion. Electronically Signed   By: Nelson Chimes M.D.   On: 07/23/2019 07:31   DG Chest Port 1 View  Result Date: 07/22/2019 CLINICAL DATA:  Respiratory failure EXAM: PORTABLE CHEST 1 VIEW COMPARISON:  Chest radiograph 07/07/2019 FINDINGS: ET tube mid trachea. Enteric tube tip and side-port project over the stomach. Stable cardiac and mediastinal contours. Low lung volumes. No large area pulmonary consolidation. No pleural effusion or pneumothorax. IMPRESSION: No acute cardiopulmonary process. ET tube mid trachea. Electronically Signed   By: Lovey Newcomer M.D.   On: 07/22/2019 06:49   EEG adult  Result Date: 07/23/2019 Lora Havens, MD     07/23/2019  5:03 PM Patient Name: Eddie Torres MRN: [id number] Epilepsy  Attending: Lora Havens Referring Physician/Provider: Ferne Reus, PA Date: 07/23/2019 Duration: 24.28 mins Patient history: 59 yo M ho presented after fall down 10 stairs. CT head showed BL SDH/SAH, Large R frontal contusion, Tentorial and falcine SDH. EEG to evaluate for seizure. Level of alertness: comatose AEDs during EEG study: Clonazepam, LEV, propofol Technical aspects: This EEG study was done with scalp electrodes positioned according to the 10-20 International system of electrode placement. Electrical activity was acquired at a sampling rate of 500Hz  and reviewed with a high frequency filter of 70Hz  and a low frequency filter of 1Hz . EEG data were recorded continuously and digitally stored. DESCRIPTION: EEG showed non convulsive status epilepticus arising from right frontal region ( F4.F8). There was also continuous generalized 3-5Hz  theta-delta slowing as well as polyspikes in right frontal region. Hyperventilation and photic stimulation were not performed due to ams. 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 There is also profound diffuse encephalopathy, non specific to etiology. Dr Rory Percy and Ferne Reus, PA were notified Lora Havens    Assessment: 59 yo male With unknown PMH who presented to [facility] as a trauma after a fall down 10 steps.  Extensive subdural hemorrhages bilaterally and by the falx.  Also noted traumatic subarachnoid bleed. Exam this morning was different than prior exams and his level of consciousness was lower for which imaging was repeated which is stable but an EEG was also ordered which has been read as showing frequent right sided subclinical seizures akin to electrographic status epilepticus. Neurological consultation  obtained for this. Suspect that the status epilepticus is due to the underlying bleed.  EtOH history also makes withdrawal seizures or increased susceptibility disease possible. Probably needs higher level of sedation and addition of  antiepileptics. Will need monitoring on LTM EEG.  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  Would recommend keeping it below 160.  -- JLaurey Morale MSN, NP-C [facility] [phone] Attending neurologist's note to follow  Attending Neurohospitalist Addendum Patient seen and examined with APP/Resident. Agree with the history and physical as documented above. Agree with the plan as documented, which I helped formulate. I have independently reviewed the chart, obtained history, review of systems and examined the patient.I have personally reviewed pertinent head/neck/spine imaging (CT/MRI). Please feel free to call with any questions. --- AAmie Portland MD [facility] Pager: [phone] If 7pm to 7am, please call on call as listed on AMION.  CRITICAL CARE ATTESTATION Performed by: AAmie Portland MD Total critical care time: 35 minutes Critical care time was exclusive of separately billable procedures and treating other patients and/or supervising APPs/Residents/Students Critical care was necessary to treat or prevent imminent or life-threatening deterioration due to TBI, seizures, status epilepticus, subdural hematoma, subarachnoid hemorrhage traumatic This patient  is critically ill and at significant risk for neurological worsening and/or death and care requires constant monitoring. Critical care was time spent personally by me on the following activities: development of treatment plan with patient and/or surrogate as well as nursing, discussions with consultants, evaluation of patient's response to treatment, examination of patient, obtaining history from patient or surrogate, ordering and performing treatments and interventions, ordering and review of laboratory studies, ordering and review of radiographic studies, pulse oximetry, re-evaluation of patient's condition, participation in multidisciplinary rounds and medical decision making of high complexity in the care of this patient.

## 2019-07-23 NOTE — Progress Notes (Signed)
  NEUROSURGERY PROGRESS NOTE   No issues overnight. Significant agitation overnight requiring increasing fentanyl and propofol. Trending downward on sedation. Still no witnessed movement in LLE  EXAM:  BP 125/82   Pulse 81   Temp (!) 100.9 F (38.3 C)   Resp (!) 24   Ht 5\' 6"  (1.676 m)   Wt 92.2 kg   SpO2 100%   BMI 32.81 kg/m   Intubated Does not open eyes to voice Pupils reactive Minimal response with RUE to central pain  IMPRESSION/PLAN 60 y.o. male s/p fall with severe TBI, left leg paresis. Appears less responsive today, question if due to significant amount of sedation overnight for agitation. - stat CT head - EEG

## 2019-07-23 NOTE — Progress Notes (Signed)
EEG complete - results pending 

## 2019-07-23 NOTE — Progress Notes (Signed)
LTM EEG hooked up and running - no initial skin breakdown - push button tested - neuro notified.  

## 2019-07-23 NOTE — Progress Notes (Signed)
Pt was transported to CT scan and back without complications.  

## 2019-07-23 NOTE — Procedures (Addendum)
Patient Name: Eddie Torres  MRN: [id number]  Epilepsy Attending: Charlsie Quest  Referring Physician/Provider: Cindra Presume, PA Date: 07/23/2019 Duration: 24.28 mins  Patient history: 59 yo M ho presented after fall down 10 stairs. CT head showed BL SDH/SAH, Large R frontal contusion, Tentorial and falcine SDH. EEG to evaluate for seizure.   Level of alertness: comatose  AEDs during EEG study: Clonazepam, LEV, propofol  Technical aspects: This EEG study was done with scalp electrodes positioned according to the 10-20 International system of electrode placement. Electrical activity was acquired at a sampling rate of 500Hz  and reviewed with a high frequency filter of 70Hz  and a low frequency filter of 1Hz . EEG data were recorded continuously and digitally stored.   DESCRIPTION: EEG showed non convulsive status epilepticus arising from right frontal region ( F4.F8). There was also continuous generalized 3-5Hz  theta-delta slowing as well as polyspikes in right frontal region. Hyperventilation and photic stimulation were not performed due to ams.   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 There is also profound diffuse encephalopathy, non specific to etiology.   Dr and , PA were notified  06-17-1975

## 2019-07-24 DIAGNOSIS — G40901 Epilepsy, unspecified, not intractable, with status epilepticus: Secondary | ICD-10-CM

## 2019-07-24 LAB — BASIC METABOLIC PANEL
Anion gap: 10 (ref 5–15)
BUN: 12 mg/dL (ref 6–20)
CO2: 21 mmol/L — ABNORMAL LOW (ref 22–32)
Calcium: 7.8 mg/dL — ABNORMAL LOW (ref 8.9–10.3)
Chloride: 105 mmol/L (ref 98–111)
Creatinine, Ser: 0.87 mg/dL (ref 0.61–1.24)
GFR calc Af Amer: >60 mL/min (ref >60)
GFR calc non Af Amer: >60 mL/min (ref >60)
Glucose, Bld: 109 mg/dL — ABNORMAL HIGH (ref 70–99)
Potassium: 4.1 mmol/L (ref 3.5–5.1)
Sodium: 136 mmol/L (ref 135–145)

## 2019-07-24 LAB — TRIGLYCERIDES: Triglycerides: 193 mg/dL — ABNORMAL HIGH (ref <150)

## 2019-07-24 LAB — CBC
HCT: 29.3 % — ABNORMAL LOW (ref 39.0–52.0)
Hemoglobin: 9.7 g/dL — ABNORMAL LOW (ref 13.0–17.0)
MCH: 34.2 pg — ABNORMAL HIGH (ref 26.0–34.0)
MCHC: 33.1 g/dL (ref 30.0–36.0)
MCV: 103.2 fL — ABNORMAL HIGH (ref 80.0–100.0)
Platelets: 79 10*3/uL — ABNORMAL LOW (ref 150–400)
RBC: 2.84 MIL/uL — ABNORMAL LOW (ref 4.22–5.81)
RDW: 11.7 % (ref 11.5–15.5)
WBC: 9.9 10*3/uL (ref 4.0–10.5)
nRBC: 0.3 % — ABNORMAL HIGH (ref 0.0–0.2)

## 2019-07-24 LAB — GLUCOSE, CAPILLARY
Glucose-Capillary: 117 mg/dL — ABNORMAL HIGH (ref 70–99)
Glucose-Capillary: 111 mg/dL — ABNORMAL HIGH (ref 70–99)
Glucose-Capillary: 145 mg/dL — ABNORMAL HIGH (ref 70–99)
Glucose-Capillary: 113 mg/dL — ABNORMAL HIGH (ref 70–99)
Glucose-Capillary: 114 mg/dL — ABNORMAL HIGH (ref 70–99)
Glucose-Capillary: 149 mg/dL — ABNORMAL HIGH (ref 70–99)

## 2019-07-24 LAB — COMPREHENSIVE METABOLIC PANEL
ALT: 34 U/L (ref 0–44)
AST: 56 U/L — ABNORMAL HIGH (ref 15–41)
Albumin: 2.5 g/dL — ABNORMAL LOW (ref 3.5–5.0)
Alkaline Phosphatase: 45 U/L (ref 38–126)
Anion gap: 11 (ref 5–15)
BUN: 11 mg/dL (ref 6–20)
CO2: 19 mmol/L — ABNORMAL LOW (ref 22–32)
Calcium: 7.7 mg/dL — ABNORMAL LOW (ref 8.9–10.3)
Chloride: 105 mmol/L (ref 98–111)
Creatinine, Ser: 0.82 mg/dL (ref 0.61–1.24)
GFR calc Af Amer: >60 mL/min (ref >60)
GFR calc non Af Amer: >60 mL/min (ref >60)
Glucose, Bld: 153 mg/dL — ABNORMAL HIGH (ref 70–99)
Potassium: 4.5 mmol/L (ref 3.5–5.1)
Sodium: 135 mmol/L (ref 135–145)
Total Bilirubin: 1 mg/dL (ref 0.3–1.2)
Total Protein: 5.1 g/dL — ABNORMAL LOW (ref 6.5–8.1)

## 2019-07-24 LAB — URINALYSIS, ROUTINE W REFLEX MICROSCOPIC
Bilirubin Urine: NEGATIVE
Glucose, UA: NEGATIVE mg/dL
Hgb urine dipstick: NEGATIVE
Ketones, ur: 5 mg/dL — AB
Leukocytes,Ua: NEGATIVE
Nitrite: NEGATIVE
Protein, ur: NEGATIVE mg/dL
Specific Gravity, Urine: 1.032 — ABNORMAL HIGH (ref 1.005–1.030)
pH: 5 (ref 5.0–8.0)

## 2019-07-24 LAB — MAGNESIUM: Magnesium: 2.3 mg/dL (ref 1.7–2.4)

## 2019-07-24 LAB — PHOSPHORUS: Phosphorus: 2 mg/dL — ABNORMAL LOW (ref 2.5–4.6)

## 2019-07-24 LAB — PHENYTOIN LEVEL, TOTAL: Phenytoin Lvl: 10.9 ug/mL (ref 10.0–20.0)

## 2019-07-24 MED ORDER — SODIUM CHLORIDE 0.9 % IV SOLN: Freq: Once | INTRAVENOUS | Status: AC

## 2019-07-24 MED ORDER — NOREPINEPHRINE 4 MG/250ML-% IV SOLN
2.0000 ug/min | INTRAVENOUS | Status: DC
  Administered 2019-07-24: 2 ug/min via INTRAVENOUS
  Filled 2019-07-24: qty 250

## 2019-07-24 MED ORDER — MIDAZOLAM HCL 2 MG/2ML IJ SOLN
10.0000 mg | Freq: Once | INTRAMUSCULAR | Status: AC
  Administered 2019-07-24: 10 mg via INTRAVENOUS
  Filled 2019-07-24: qty 10

## 2019-07-24 MED ORDER — SODIUM CHLORIDE 0.9 % IV SOLN: 250.0000 mL | INTRAVENOUS | Status: DC

## 2019-07-24 MED ORDER — CLONAZEPAM 1 MG PO TABS
2.0000 mg | ORAL_TABLET | Freq: Three times a day (TID) | ORAL | Status: DC
  Administered 2019-07-24 – 2019-07-29 (×16): 2 mg
  Filled 2019-07-24 (×16): qty 2

## 2019-07-24 MED ORDER — SODIUM PHOSPHATES 45 MMOLE/15ML IV SOLN
20.0000 mmol | Freq: Once | INTRAVENOUS | Status: AC
  Administered 2019-07-24: 20 mmol via INTRAVENOUS
  Filled 2019-07-24: qty 6.67

## 2019-07-24 MED ORDER — CLONAZEPAM 1 MG PO TABS: 1.0000 mg | ORAL_TABLET | Freq: Two times a day (BID) | ORAL | Status: DC

## 2019-07-24 MED ORDER — SODIUM CHLORIDE 0.9% FLUSH
10.0000 mL | Freq: Two times a day (BID) | INTRAVENOUS | Status: DC
  Administered 2019-07-24: 10 mL
  Administered 2019-07-24: 30 mL
  Administered 2019-07-24 – 2019-07-26 (×3): 10 mL
  Administered 2019-07-27: 20 mL
  Administered 2019-07-28 – 2019-07-29 (×3): 10 mL

## 2019-07-24 MED ORDER — SODIUM CHLORIDE 0.9 % IV SOLN: INTRAVENOUS | Status: DC | PRN

## 2019-07-24 MED ORDER — MIDAZOLAM HCL 2 MG/2ML IJ SOLN
10.0000 mg | Freq: Once | INTRAMUSCULAR | Status: DC
  Filled 2019-07-24 (×2): qty 10

## 2019-07-24 MED ORDER — MIDAZOLAM 50MG/50ML (1MG/ML) PREMIX INFUSION
5.0000 mg/h | INTRAVENOUS | Status: DC
  Administered 2019-07-24 – 2019-07-25 (×3): 5 mg/h via INTRAVENOUS
  Filled 2019-07-24 (×3): qty 50

## 2019-07-24 MED ORDER — SODIUM CHLORIDE 0.9% FLUSH: 10.0000 mL | INTRAVENOUS | Status: DC | PRN

## 2019-07-24 NOTE — Procedures (Addendum)
Patient Name: Eddie Torres  MRN: [id number]  Epilepsy Attending: Charlsie Quest  Referring Physician/Provider: Dr Milon Dikes Duration: 07/23/2019 2052 to 07/24/2019 2052  Patient history: 59 yo M ho presented after fall down 10 stairs. CT head showed BL SDH/SAH, Large R frontal contusion, Tentorial and falcine SDH. EEG to evaluate for seizure.   Level of alertness: comatose  AEDs during EEG study: Clonazepam, LEV, propofol, LCM, versed  Technical aspects: This EEG study was done with scalp electrodes positioned according to the 10-20 International system of electrode placement. Electrical activity was acquired at a sampling rate of 500Hz  and reviewed with a high frequency filter of 70Hz  and a low frequency filter of 1Hz . EEG data were recorded continuously and digitally stored.   DESCRIPTION: EEG initially showed multiple seizures without clinical signs arising from right frontal region ( F4/F8, lasting avg 15 seconds. Last seizure was noted on 07/24/2019 at 0657. Polyspikes were noted in right frontal region. There was also continuous generalized 3-5Hz  theta-delta slowing as well as intermittent rhythmic 2-3hz  delta slowing in left frontocentral region.  Hyperventilation and photic stimulation were not performed due to ams.   Patient event button was pressed on 07/23/2019 at 2200 for unclear reasons. Concomitant EEG before, during and after the event didn't show any eeg change to suggest seizure.   ABNORMALITY  - Seizures, right frontal region  (maximal F4/F8) - Polyspikes, right frontal region - Continuous slow, generalized  - Intermittent rhythmic slow, left frontocentral region  IMPRESSION: This study showed evidence of seizures with no clinical signs arising from right frontal region ( maximal F4, F8), lasting avg 15 seconds, last seizure on 07/24/2019 at 0657. There is also evidence of cortical dysfunction in left frontocentral region likely secondary to underlying TBI and  infarctions. Additionally, there is evidence of severe diffuse encephalopathy, non specific to etiology.   [name] 07/25/2019

## 2019-07-24 NOTE — Procedures (Signed)
Arterial Catheter Insertion Procedure Note DEQUARIUS JEFFRIES [id number] 12/08/1960  Procedure: Insertion of Arterial Catheter  Indications: Blood pressure monitoring and Frequent blood sampling  Procedure Details Consent: Unable to obtain consent because of patient is sedated on the ventilator. Time Out: Verified patient identification, verified procedure, site/side was marked, verified correct patient position, special equipment/implants available, medications/allergies/relevent history reviewed, required imaging and test results available.  Performed  Maximum sterile technique was used including antiseptics, cap, gloves, gown, hand hygiene, mask and sheet. Skin prep: Chlorhexidine; local anesthetic administered 20 gauge catheter was inserted into right radial artery using the Seldinger technique. ULTRASOUND GUIDANCE USED: NO Evaluation Blood flow good; BP tracing good. Complications: No apparent complications.   Jacqulynn Cadet 07/24/2019

## 2019-07-24 NOTE — Plan of Care (Signed)
LTM eeg reviewed till 2. No further seizures noted since 0657. Continue to wean off propofol.   Eddie Torres [name]

## 2019-07-24 NOTE — Progress Notes (Signed)
  NEUROSURGERY PROGRESS NOTE   EEG obtained yesterday shows patient in status. Neuro consulted. Remains intubated  EXAM:  BP 111/79   Pulse 90   Temp 99.9 F (37.7 C)   Resp (!) 24   Ht 5\' 6"  (1.676 m)   Wt 92.2 kg   SpO2 100%   BMI 32.81 kg/m   Intubated Pupils 45mm bilaterally, reactive No response to central pain  IMPRESSION/PLAN 59 y.o. male s/p fall with severe TBI, bilateral temporo-parietal periventricular infarctions. EEG shows in status. LTM EEG applied.  - Status - per Neurology. LTM EEG. MRI - continue supportive care

## 2019-07-24 NOTE — Progress Notes (Addendum)
Trauma/Critical Care Follow Up Note  Subjective:    Overnight Issues:   Objective:  Vital signs for last 24 hours: Temp:  [99 F (37.2 C)-100.9 F (38.3 C)] 99 F (37.2 C) (02/01 1200) Pulse Rate:  [72-108] 73 (02/01 1200) Resp:  [14-24] 24 (02/01 1200) BP: (87-146)/(62-92) 91/68 (02/01 1200) SpO2:  [96 %-100 %] 100 % (02/01 1200) Arterial Line BP: (91)/(38) 91/38 (02/01 1200) FiO2 (%):  [40 %] 40 % (02/01 1150)  Hemodynamic parameters for last 24 hours:    Intake/Output from previous day: 01/31 0701 - 02/01 0700 In: 3115.5 [I.V.:2440.3; IV Piggyback:675.2] Out: 900 [Urine:900]  Intake/Output this shift: Total I/O In: 427.4 [I.V.:327.1; IV Piggyback:100.3] Out: -   Vent settings for last 24 hours: Vent Mode: PRVC FiO2 (%):  [40 %] 40 % Set Rate:  [24 bmp] 24 bmp Vt Set:  [510 mL] 510 mL PEEP:  [5 cmH20] 5 cmH20 Plateau Pressure:  [18 cmH20-24 cmH20] 18 cmH20  Physical Exam:  Gen:no distress Neuro: not following commands, last sz around 0700 this AM HEENT: PERRL Neck: supple CV: RRR Pulm: unlabored breathing on MV Abd: soft, NT, distended per nurse and patient's daughter at bedside compared to yesterday GU: clear yellow urine Extr: wwp, trace edema   Results for orders placed or performed during the hospital encounter of 07/22/2019 (from the past 24 hour(s))  Glucose, capillary     Status: Abnormal   Collection Time: 07/23/19  3:09 PM  Result Value Ref Range   Glucose-Capillary 140 (H) 70 - 99 mg/dL   Comment 1 Notify RN    Comment 2 Document in Chart   Phenytoin level, total     Status: Abnormal   Collection Time: 07/23/19  5:27 PM  Result Value Ref Range   Phenytoin Lvl <2.5 (L) 10.0 - 20.0 ug/mL  Glucose, capillary     Status: Abnormal   Collection Time: 07/23/19  8:11 PM  Result Value Ref Range   Glucose-Capillary 174 (H) 70 - 99 mg/dL  Culture, blood (routine x 2)     Status: None (Preliminary result)   Collection Time: 07/23/19 11:03 PM   Specimen: BLOOD  Result Value Ref Range   Specimen Description BLOOD LEFT ANTECUBITAL    Special Requests      BOTTLES DRAWN AEROBIC ONLY Blood Culture adequate volume   Culture      NO GROWTH < 12 HOURS Performed at [facility], [address], [place], [place] [address]    Report Status PENDING   Culture, blood (routine x 2)     Status: None (Preliminary result)   Collection Time: 07/23/19 11:08 PM   Specimen: BLOOD  Result Value Ref Range   Specimen Description BLOOD LEFT ANTECUBITAL    Special Requests      BOTTLES DRAWN AEROBIC ONLY Blood Culture adequate volume   Culture      NO GROWTH < 12 HOURS Performed at [facility], [address] [place], [place] [address]    Report Status PENDING   Glucose, capillary     Status: Abnormal   Collection Time: 07/23/19 11:35 PM  Result Value Ref Range   Glucose-Capillary 121 (H) 70 - 99 mg/dL  Glucose, capillary     Status: Abnormal   Collection Time: 07/24/19  3:48 AM  Result Value Ref Range   Glucose-Capillary 117 (H) 70 - 99 mg/dL  CBC     Status: Abnormal   Collection Time: 07/24/19  3:50 AM  Result  Value Ref Range   WBC 9.9 4.0 - 10.5 K/uL   RBC 2.84 (L) 4.22 - 5.81 MIL/uL   Hemoglobin 9.7 (L) 13.0 - 17.0 g/dL   HCT 29.3 (L) 39.0 - 52.0 %   MCV 103.2 (H) 80.0 - 100.0 fL   MCH 34.2 (H) 26.0 - 34.0 pg   MCHC 33.1 30.0 - 36.0 g/dL   RDW 11.7 11.5 - 15.5 %   Platelets 79 (L) 150 - 400 K/uL   nRBC 0.3 (H) 0.0 - 0.2 %  Basic metabolic panel     Status: Abnormal   Collection Time: 07/24/19  3:50 AM  Result Value Ref Range   Sodium 136 135 - 145 mmol/L   Potassium 4.1 3.5 - 5.1 mmol/L   Chloride 105 98 - 111 mmol/L   CO2 21 (L) 22 - 32 mmol/L   Glucose, Bld 109 (H) 70 - 99 mg/dL   BUN 12 6 - 20 mg/dL   Creatinine, Ser 0.87 0.61 - 1.24 mg/dL   Calcium 7.8 (L) 8.9 - 10.3 mg/dL   GFR calc non Af Amer >60 >60 mL/min   GFR calc Af Amer >60 >60 mL/min   Anion gap 10 5 - 15  Magnesium     Status: None    Collection Time: 07/24/19  3:50 AM  Result Value Ref Range   Magnesium 2.3 1.7 - 2.4 mg/dL  Phosphorus     Status: Abnormal   Collection Time: 07/24/19  3:50 AM  Result Value Ref Range   Phosphorus 2.0 (L) 2.5 - 4.6 mg/dL  Triglycerides     Status: Abnormal   Collection Time: 07/24/19  3:50 AM  Result Value Ref Range   Triglycerides 193 (H) <150 mg/dL  Glucose, capillary     Status: Abnormal   Collection Time: 07/24/19  8:09 AM  Result Value Ref Range   Glucose-Capillary 111 (H) 70 - 99 mg/dL   Comment 1 Notify RN    Comment 2 Document in Chart   Urinalysis, Routine w reflex microscopic     Status: Abnormal   Collection Time: 07/24/19 10:31 AM  Result Value Ref Range   Color, Urine YELLOW YELLOW   APPearance CLEAR CLEAR   Specific Gravity, Urine 1.032 (H) 1.005 - 1.030   pH 5.0 5.0 - 8.0   Glucose, UA NEGATIVE NEGATIVE mg/dL   Hgb urine dipstick NEGATIVE NEGATIVE   Bilirubin Urine NEGATIVE NEGATIVE   Ketones, ur 5 (A) NEGATIVE mg/dL   Protein, ur NEGATIVE NEGATIVE mg/dL   Nitrite NEGATIVE NEGATIVE   Leukocytes,Ua NEGATIVE NEGATIVE  Glucose, capillary     Status: Abnormal   Collection Time: 07/24/19 11:47 AM  Result Value Ref Range   Glucose-Capillary 145 (H) 70 - 99 mg/dL   Comment 1 Notify RN    Comment 2 Document in Chart     Assessment & Plan: Present on Admission: . ICH (intracerebral hemorrhage) ([facility])    LOS: 3 days   Additional comments:I reviewed the patient's new clinical lab test results.   and I reviewed the patients new imaging test results.    Fall down 10 stairs  B/l SDH/SAH, Large R frontal contusion, tentorial and falcine SDH-NSGY on board (Dr. Kathyrn Sheriff), CT 1/31 with slightly worsened R frontal IPH and new bi-temporoparietal infarctions 2/2 vasospasm. Possibly will need MRI/MRA when more stable and off cEEG.  L occipital skull fx, L temporal bone fx with blood in L middle ear- ENT on board (Dr Wilburn Cornelia), recs for ciprodex OTIC BID for  the  duration of hospitalization. Will need audiogram. Seizures - on cEEG. Status epilepticus 1/31, now out of status, but most recent sz at 0700 this AM. AEDs x3 (keppra [id number], dilantin 100Q8, vimpat 100Q12) plus propofol. Will transition propofol to versed gtt in light of hypotension, ensuring no breakthrough sz activity. Epileptologist to titrate AEDs. When sz free on this regimen, will begin reducing versed gtt to eval neuro status. Acute hypoxic ventilator dependent respiratory failure- continue full support Aspirationon induction - monitor pulmonary status Pneumocephalus - abx Scalp laceration- stapled in ED Possible T1 fracture- maintain c-collar for now per NSGY, most likely degenerative.  L 10th rib fracture, old rib fractures L 9-10- pain control HTN, bipolar disorder EtOH abuse- CIWA, 354 on admit Thrombocytopenia - new since 1/31, worse today. Medication review only reveals protonix as possible etiology (AEDs and abx started after initial drop). Will recheck in AM and if continuing to drop, switch to pepcid (still has same risk profile, but different class of med). Possibly due to hepatic dysfunction/EtOH abuse, but LFTs are not significantly abnormal and platelet count was normal on presentation.  FEN - start trickle TF. Continue klon/sero. Replete phos.  VTE- PAS, hold LMWH in light of thrombocytopenia (okay to start per NSGY) ID - empiric vanc/zosyn, cx NGTD, AF for the last 24h and WBC nl, would continue for an additional 24h.  Dispo- ICU  Discussion held with both daughters, one at bedside and one via video chat. Explained current clinical state and efforts to achieve seizure free status with medication regimen. Daughters asking about neuro status and prognosis, which I explained is too early to prognosticate. Daughters asking questions about withdrawal of care and when they might need to make this decision. Stated that we would be able to evaluate his underlying neuro status  by lifting sedation after obtaining seizure control. Given their line of questioning, opted to consult palliative care today and notified them of this. Also discussed code status and explained that he is currently full code. They will discuss among themselves and provide answers regarding wishes for vasopressors, defibrillation and chest compressions.   Critical Care Total Time: 60 minutes  Diamantina Monks, MD Trauma & General Surgery Please use [email or web address] to contact on call provider  07/24/2019  *Care during the described time interval was provided by me. I have reviewed this patient's available data, including medical history, events of note, physical examination and test results as part of my evaluation.

## 2019-07-24 NOTE — Progress Notes (Addendum)
Subjective: Patient was found to be in non convulsive focal status epilepticus yesterday. Continues to be on LTM EEG  ROS: unable to obtain due to poor mental status  Examination  Vital signs in last 24 hours: Temp:  [99.7 F (37.6 C)-101.3 F (38.5 C)] 99.9 F (37.7 C) (02/01 1000) Pulse Rate:  [72-108] 72 (02/01 1000) Resp:  [14-24] 24 (02/01 1000) BP: (87-152)/(62-92) 97/70 (02/01 1000) SpO2:  [96 %-100 %] 100 % (02/01 1000) FiO2 (%):  [40 %] 40 % (02/01 0802)  General: lying in bed, NAD CVS: pulse-normal rate and rhythm RS: breathing comfortably, intubated Extremities: normal, +mild edema  Neuro: On propofol, versed MS: comatose, doesn't open eyes to noxious stimulation CN: pupils equal and reactive, no forced gaze deviation ( didn't perform oculocephalics as patient in C collar), corneal reflex intact, no gag reflex appreciated Motor: withdraws to noxious stimuli in BL LE extremities, no movement in BL UE Reflexes: diffusely areflexic except BL upgoing plantar reflex  Basic Metabolic Panel: Recent Labs  Lab 07/01/2019 1559 07/16/2019 1559 06/26/2019 1611 07/16/2019 1611 07/19/2019 1656 07/22/19 0235 07/22/19 0408 07/23/19 0239 07/24/19 0350  NA 135   < > 136   < > 135 136 137 137 136  K 3.5   < > 3.5   < > 3.6 3.7 4.3 4.1 4.1  CL 100  --  102  --   --  101  --  105 105  CO2 20*  --   --   --   --  24  --  23 21*  GLUCOSE 148*  --  145*  --   --  144*  --  154* 109*  BUN 14  --  15  --   --  11  --  13 12  CREATININE 0.94  --  1.30*  --   --  0.81  --  0.90 0.87  CALCIUM 8.3*   < >  --   --   --  7.7*  --  7.7* 7.8*  MG  --   --   --   --   --  1.4*  --  2.0 2.3  PHOS  --   --   --   --   --  3.8  --  2.4* 2.0*   < > = values in this interval not displayed.    CBC: Recent Labs  Lab 07/12/2019 1559 07/22/2019 1611 07/19/2019 1656 07/22/19 0235 07/22/19 0408 07/23/19 0239 07/24/19 0350  WBC 8.6  --   --  16.8*  --  11.6* 9.9  HGB 15.1   < > 12.9* 12.1* 11.6* 10.0*  9.7*  HCT 44.1   < > 38.0* 35.4* 34.0* 30.2* 29.3*  MCV 100.2*  --   --  99.7  --  103.8* 103.2*  PLT 169  --   --  155  --  93* 79*   < > = values in this interval not displayed.     Coagulation Studies: Recent Labs    06/24/2019 1559  LABPROT 12.1  INR 0.9    Imaging CT head 07/23/2019: No evidence of much additional intracranial bleeding. Fairly stable patterns of extensive hemorrhagic contusions, subarachnoid blood and subdural blood. There may be slightly more intraparenchymal bleeding in the right frontal region. No evidence of herniation or midline shift.  Newly seen focal low densities at the temporoparietal junctions bilaterally, favored to represent infarctions subsequent to vasospasm.   ASSESSMENT AND PLAN: 59 yo male with unknown PMH  who presented to [facility] as a trauma after a fall down 10 steps.  Extensive subdural hemorrhages bilaterally and by the falx.  Also noted traumatic subarachnoid bleed. 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  - Acute encephalopathy secondary to trauma leading to SDH and SAH, toxic- metabolic ( status epilepticus and sedation from treating meds)  Recommendations - LTM EEG showed frequent seizures overnight. Will continue LTM eeg to adjust AEDs and sedation - Discussed with surgery team, plan to reduce propofol due to hypotension and hypertriglyceridemia - Will given 5ml versed bolus once followed by versed drip at 33ml/hr -  continue LEV 1500mg  BID, checked dilantin level (corrected 13.8) so will continue dilantin 100mg  Q8h. Increased clonopin to 2mg  TID.  - Checked EKG, normal PR interval. If needed can increase Vimpat to 150mg  BID - if seizures persist will titrate versed as needed - continue seizure precautions - PRN IV versed 5mg  for clinical seizure activity - management of other comorbidities including thrombocytopenia by primary team   ADDENDUM -Patient's EEG reviewed multiple times.  Continues to be seizure-free.  Have been slowly weaning off propofol.  Updated team to reduce propofol to 2.5 MCG per hour.  We will continue to monitor with plan to stop propofol in couple of hours if patient continues to be seizure-free.   CRITICAL CARE Performed by:   Total critical care time: 95 minutes  Critical care time was exclusive of separately billable procedures and treating other patients.  Critical care was necessary to treat or prevent imminent or life-threatening deterioration.  Critical care was time spent personally by me on the following activities: development of treatment plan with patient and/or surrogate as well as nursing, discussions with consultants, evaluation of patient's response to treatment, examination of patient, obtaining history from patient or surrogate, ordering and performing treatments and interventions, ordering and review of laboratory studies, ordering and review of radiographic studies, pulse oximetry and re-evaluation of patient's condition.

## 2019-07-24 NOTE — Progress Notes (Signed)
EEG maint complete. No skin breakdown at Fp1 FP2 F3 F7 F8 C3. Continue to monitor

## 2019-07-24 NOTE — Progress Notes (Signed)
Interim Note  Patient connected to continuous EEG monitoring as routine EEG showed multiple seizures. Patient was also loaded with fosphenytoin and Keppra prior to long-term EEG.  Discussed continuous EEG reading with Dr. Melynda Ripple, patient still having seizures in the form of Vimpat 200 mg starting 100 mg twice daily.  Also noted patient has been febrile, called trauma service and discussed starting antibiotics as well as ordered UA, urine culture and blood cultures.  Reviewed EEG, has improved.  We will continue to follow

## 2019-07-24 DEATH — deceased

## 2019-07-25 DIAGNOSIS — R569 Unspecified convulsions: Secondary | ICD-10-CM

## 2019-07-25 DIAGNOSIS — S0990XA Unspecified injury of head, initial encounter: Secondary | ICD-10-CM

## 2019-07-25 DIAGNOSIS — Z515 Encounter for palliative care: Secondary | ICD-10-CM

## 2019-07-25 DIAGNOSIS — J9690 Respiratory failure, unspecified, unspecified whether with hypoxia or hypercapnia: Secondary | ICD-10-CM

## 2019-07-25 DIAGNOSIS — Z7189 Other specified counseling: Secondary | ICD-10-CM

## 2019-07-25 LAB — GLUCOSE, CAPILLARY
Glucose-Capillary: 125 mg/dL — ABNORMAL HIGH (ref 70–99)
Glucose-Capillary: 136 mg/dL — ABNORMAL HIGH (ref 70–99)
Glucose-Capillary: 148 mg/dL — ABNORMAL HIGH (ref 70–99)
Glucose-Capillary: 148 mg/dL — ABNORMAL HIGH (ref 70–99)
Glucose-Capillary: 150 mg/dL — ABNORMAL HIGH (ref 70–99)
Glucose-Capillary: 158 mg/dL — ABNORMAL HIGH (ref 70–99)

## 2019-07-25 LAB — PHOSPHORUS
Phosphorus: 3.1 mg/dL (ref 2.5–4.6)
Phosphorus: 3 mg/dL (ref 2.5–4.6)

## 2019-07-25 LAB — BASIC METABOLIC PANEL
Anion gap: 12 (ref 5–15)
BUN: 8 mg/dL (ref 6–20)
CO2: 20 mmol/L — ABNORMAL LOW (ref 22–32)
Calcium: 7.9 mg/dL — ABNORMAL LOW (ref 8.9–10.3)
Chloride: 105 mmol/L (ref 98–111)
Creatinine, Ser: 0.78 mg/dL (ref 0.61–1.24)
GFR calc Af Amer: >60 mL/min (ref >60)
GFR calc non Af Amer: >60 mL/min (ref >60)
Glucose, Bld: 170 mg/dL — ABNORMAL HIGH (ref 70–99)
Potassium: 3.5 mmol/L (ref 3.5–5.1)
Sodium: 137 mmol/L (ref 135–145)

## 2019-07-25 LAB — CBC
HCT: 30 % — ABNORMAL LOW (ref 39.0–52.0)
Hemoglobin: 9.9 g/dL — ABNORMAL LOW (ref 13.0–17.0)
MCH: 34.1 pg — ABNORMAL HIGH (ref 26.0–34.0)
MCHC: 33 g/dL (ref 30.0–36.0)
MCV: 103.4 fL — ABNORMAL HIGH (ref 80.0–100.0)
Platelets: 139 10*3/uL — ABNORMAL LOW (ref 150–400)
RBC: 2.9 MIL/uL — ABNORMAL LOW (ref 4.22–5.81)
RDW: 12 % (ref 11.5–15.5)
WBC: 12.2 10*3/uL — ABNORMAL HIGH (ref 4.0–10.5)
nRBC: 0.5 % — ABNORMAL HIGH (ref 0.0–0.2)

## 2019-07-25 LAB — MAGNESIUM
Magnesium: 2 mg/dL (ref 1.7–2.4)
Magnesium: 2.1 mg/dL (ref 1.7–2.4)

## 2019-07-25 LAB — URINE CULTURE: Culture: NO GROWTH

## 2019-07-25 LAB — TRIGLYCERIDES: Triglycerides: 157 mg/dL — ABNORMAL HIGH (ref <150)

## 2019-07-25 MED ORDER — FREE WATER
30.0000 mL | Status: DC
  Administered 2019-07-25 – 2019-07-29 (×25): 30 mL

## 2019-07-25 MED ORDER — PIVOT 1.5 CAL PO LIQD: 1000.0000 mL | ORAL | Status: DC

## 2019-07-25 MED ORDER — VITAL HIGH PROTEIN PO LIQD
1000.0000 mL | ORAL | Status: DC
  Administered 2019-07-25 – 2019-07-26 (×2): 1000 mL

## 2019-07-25 MED ORDER — DEXMEDETOMIDINE HCL IN NACL 400 MCG/100ML IV SOLN
0.4000 ug/kg/h | INTRAVENOUS | Status: DC
  Administered 2019-07-25: 0.8 ug/kg/h via INTRAVENOUS
  Administered 2019-07-25: 0.4 ug/kg/h via INTRAVENOUS
  Administered 2019-07-25: 0.8 ug/kg/h via INTRAVENOUS
  Administered 2019-07-26: 0.7 ug/kg/h via INTRAVENOUS
  Administered 2019-07-26 (×2): 0.8 ug/kg/h via INTRAVENOUS
  Administered 2019-07-26: 11:00:00 0.7 ug/kg/h via INTRAVENOUS
  Administered 2019-07-26 – 2019-07-27 (×3): 0.8 ug/kg/h via INTRAVENOUS
  Administered 2019-07-27 (×2): 0.9 ug/kg/h via INTRAVENOUS
  Administered 2019-07-27 – 2019-07-28 (×2): 0.8 ug/kg/h via INTRAVENOUS
  Administered 2019-07-28: 17:00:00 1 ug/kg/h via INTRAVENOUS
  Administered 2019-07-28: 0.7 ug/kg/h via INTRAVENOUS
  Administered 2019-07-28: 1 ug/kg/h via INTRAVENOUS
  Administered 2019-07-28: 0.8 ug/kg/h via INTRAVENOUS
  Administered 2019-07-28: 1 ug/kg/h via INTRAVENOUS
  Administered 2019-07-29: 0.8 ug/kg/h via INTRAVENOUS
  Filled 2019-07-25 (×7): qty 100
  Filled 2019-07-25: qty 200
  Filled 2019-07-25 (×7): qty 100
  Filled 2019-07-25: qty 200
  Filled 2019-07-25 (×4): qty 100

## 2019-07-25 MED ORDER — PRO-STAT SUGAR FREE PO LIQD
30.0000 mL | Freq: Three times a day (TID) | ORAL | Status: DC
  Administered 2019-07-25 – 2019-07-29 (×13): 30 mL
  Filled 2019-07-25 (×13): qty 30

## 2019-07-25 NOTE — Progress Notes (Signed)
EEG maint complete. No skin breakdown noted. Checked Fp1 Fp2, F7 Fz F8 A1 A2 Moved leads slightly on forehead to prevent any breakdown. Continue to monitor

## 2019-07-25 NOTE — Consult Note (Signed)
Consultation Note Date: 07/25/2019   Patient Name: Eddie Torres  DOB: 10/23/1960  MRN: [id number]  Age / Sex: 59 y.o., male  PCP: Patient, No Pcp Per Referring Physician: Md, Trauma, MD  Reason for Consultation: Establishing goals of care and Psychosocial/spiritual support  HPI/Patient Profile: 59 y.o. male  admitted on 06/27/2019 who was brought in as a level 1 trauma after fall down estimated 10 stairs. Fall was not witnessed but heard by roommates and they found him down at the bottom of the stairs. EMS reported obvious head trauma and GCS of 3. Patient lived in a "home for elder men". Friends were unable to provide any medical history. On arrival patient with GCS7 [phone]). Intubated for airway protection. Moved bilateral UEs with good strength prior to intubation. Stellate L posterior scalp wound ~4-5 cm at longest point. Per patient's other chart, PMH significant for HTN, EtOH abuse, Bipolar disorder.   Today is day 4 of his hospitalization, he remains intubated and vent dependent, he is sedated but according to the bedside RN he does follow some simple commands.  Both daughters today tell me that Eddie Torres has a long history of severe alcohol abuse, he has had significant cognitive decline over the past many years, he lives in a boarding housee.  Patient's family face treatment option decisions, advanced directive decisions and anticipatory care needs.    Clinical Assessment and Goals of Care:   This NP Eddie Torres reviewed medical records, received report from team, assessed the patient and then meet at the patient's bedside along with his daughters Eddie Torres at the bedside and Eddie Torres by video chat to discuss diagnosis, prognosis, GOC, EOL wishes disposition and options.  Concept of Hospice and Palliative Care were discussed  A detailed discussion was had today regarding advanced  directives.  Concepts specific to code status, artifical feeding and hydration, continued IV antibiotics and rehospitalization was had.  The difference between a aggressive medical intervention path  and a palliative comfort care path for this patient at this time was had.  Values and goals of care important to patient and family were attempted to be elicited.  Created space and opportunity for family to explore their thoughts and feelings regarding their father's current medical situation.  They both verbalize hardship secondary to their fathers lifetime of alcohol abuse. They both expressed their love for him.  They both expressed the difficulty of being the responsible parties for making decisions for their father.  We discussed the difference between and aggressive medical intervention path and a palliative comfort path in certain situation; specifically if this patient is able to wean from the vent, begin to eat on his own and ambulate or not.  MOST form introduced, Hard Choices booklet left for review.. I also provided family with Medicaid application information  Natural trajectory and expectations at EOL were discussed.  Questions and concerns addressed.   Family encouraged to call with questions or concerns.    PMT will continue to support holistically.   NEXT OF  KIN- two daughters are patient's main support persons    SUMMARY OF RECOMMENDATIONS    Code Status/Advance Care Planning:  Full code- Encouraged family to consider DNR/DNI status once patient has been weaned from the ventilator   Palliative Prophylaxis:   Aspiration, Bowel Regimen, Delirium Protocol, Frequent Pain Assessment and Oral Care  Additional Recommendations (Limitations, Scope, Preferences):  Full Scope Treatment over the next few days, hoping for improvement, watchful waiting.    Decisions will be decided dependant on outcomes  Plan is to re-meet Thursday at 1000 am for continued conversation regarding  GOC  Psycho-social/Spiritual:   Desire for further Chaplaincy support:yes  Additional Recommendations: Education on Hospice and Grief/Bereavement Support  Prognosis:   Unable to determine  Discharge Planning: To Be Determined      Primary Diagnoses: Present on Admission: . ICH (intracerebral hemorrhage) ([facility])   I have reviewed the medical record, interviewed the patient and family, and examined the patient. The following aspects are pertinent.  History reviewed. No pertinent past medical history. Social History   Socioeconomic History  . Marital status: Single    Spouse name: Not on file  . Number of children: Not on file  . Years of education: Not on file  . Highest education level: Not on file  Occupational History  . Not on file  Tobacco Use  . Smoking status: Not on file  Substance and Sexual Activity  . Alcohol use: Not on file  . Drug use: Not on file  . Sexual activity: Not on file  Other Topics Concern  . Not on file  Social History Narrative  . Not on file   Social Determinants of Health   Financial Resource Strain:   . Difficulty of Paying Living Expenses: Not on file  Food Insecurity:   . Worried About Charity fundraiser in the Last Year: Not on file  . Ran Out of Food in the Last Year: Not on file  Transportation Needs:   . Lack of Transportation (Medical): Not on file  . Lack of Transportation (Non-Medical): Not on file  Physical Activity:   . Days of Exercise per Week: Not on file  . Minutes of Exercise per Session: Not on file  Stress:   . Feeling of Stress : Not on file  Social Connections:   . Frequency of Communication with Friends and Family: Not on file  . Frequency of Social Gatherings with Friends and Family: Not on file  . Attends Religious Services: Not on file  . Active Member of Clubs or Organizations: Not on file  . Attends Archivist Meetings: Not on file  . Marital Status: Not on file   No family history on  file. Scheduled Meds: . chlorhexidine gluconate (MEDLINE KIT)  15 mL Mouth Rinse BID  . Chlorhexidine Gluconate Cloth  6 each Topical Daily  . ciprofloxacin-dexamethasone  4 drop Left EAR BID  . clonazePAM  2 mg Per Tube Q8H  . docusate  100 mg Per Tube Daily  . feeding supplement (PRO-STAT SUGAR FREE 64)  30 mL Per Tube TID  . feeding supplement (VITAL HIGH PROTEIN)  1,000 mL Per Tube Q24H  . fentaNYL (SUBLIMAZE) injection  50 mcg Intravenous Once  . folic acid  1 mg Per Tube Daily  . free water  30 mL Per Tube Q4H  . mouth rinse  15 mL Mouth Rinse 10 times per day  . multivitamin  15 mL Per Tube Daily  . pantoprazole sodium  40 mg Per Tube Daily  . phenytoin (DILANTIN) IV  100 mg Intravenous Q8H  . QUEtiapine  50 mg Per Tube BID  . sodium chloride flush  10-40 mL Intracatheter Q12H   Continuous Infusions: . sodium chloride Stopped (07/25/19 1532)  . sodium chloride    . sodium chloride    . dexmedetomidine (PRECEDEX) IV infusion 0.8 mcg/kg/hr (07/25/19 1600)  . fentaNYL infusion INTRAVENOUS 75 mcg/hr (07/25/19 1600)  . lacosamide (VIMPAT) IV Stopped (07/25/19 1237)  . levETIRAcetam Stopped (07/25/19 0758)  . norepinephrine (LEVOPHED) Adult infusion Stopped (07/24/19 1405)  . piperacillin-tazobactam (ZOSYN)  IV Stopped (07/25/19 1317)  . propofol (DIPRIVAN) infusion Stopped (07/24/19 2224)  . [START ON 07/26/2019] thiamine injection    . thiamine injection 100 mL/hr at 07/25/19 1600  . vancomycin Stopped (07/25/19 1018)   PRN Meds:.Place/Maintain arterial line **AND** sodium chloride, acetaminophen (TYLENOL) oral liquid 160 mg/5 mL, fentaNYL, fentaNYL (SUBLIMAZE) injection, hydrALAZINE, labetalol, ondansetron **OR** ondansetron (ZOFRAN) IV, sodium chloride flush Medications Prior to Admission:  Prior to Admission medications   Medication Sig Start Date End Date Taking? Authorizing Provider  ibuprofen (ADVIL) 200 MG tablet Take 400 mg by mouth every 6 (six) hours as needed (for  headaches).   Yes [provider]   Allergies  Allergen Reactions  . Shellfish-Derived Products Anaphylaxis and Shortness Of Breath   Review of Systems  Unable to perform ROS: Intubated    Physical Exam Constitutional:      Appearance: He is underweight. He is ill-appearing.     Interventions: He is intubated.  Cardiovascular:     Rate and Rhythm: Normal rate.  Pulmonary:     Effort: He is intubated.  Skin:    General: Skin is warm and dry.     Vital Signs: BP 109/74 (BP Location: Right Arm)   Pulse 72   Temp 97.9 F (36.6 C) (Axillary)   Resp 18   Ht _0  (1.676 m)   Wt 98.1 kg   SpO2 99%   BMI 34.91 kg/m  Pain Scale: CPOT       SpO2: SpO2: 99 % O2 Device:SpO2: 99 % O2 Flow Rate: .   IO: Intake/output summary:   Intake/Output Summary (Last 24 hours) at 07/25/2019 1606 Last data filed at 07/25/2019 1600 Gross per 24 hour  Intake 4114.16 ml  Output 1570 ml  Net 2544.16 ml    LBM: Last BM Date: (pta) Baseline Weight: Weight: 100 kg Most recent weight: Weight: 98.1 kg     Palliative Assessment/Data:   Discussed with bedside RN  Time In: 2841 Time Out: 1600 Time Total: 75 minutes Greater than 50%  of this time was spent counseling and coordinating care related to the above assessment and plan.  Signed by: Eddie Lessen, NP   Please contact Palliative Medicine Team phone at [phone] for questions and concerns.  For individual provider: See Shea Evans

## 2019-07-25 NOTE — Procedures (Addendum)
Patient Name:Montrae XAVIER MUNGER OEH:[id number] Epilepsy Attending:Rebel Willcutt [name] Referring Physician/Provider:Dr Milon Dikes Duration: 07/24/2019 2052 to 07/25/2019 2052  Patient history:59 yo [name] presented after fall down 10 stairs. CT head showedBL SDH/SAH, Large R frontal contusion, Tentorial and falcine SDH. EEG to evaluate for seizure.  Level of alertness:comatose  AEDs during EEG study:Clonazepam, LEV, propofol, LCM, versed  Technical aspects: This EEG study was done with scalp electrodes positioned according to the 10-20 International system of electrode placement. Electrical activity was acquired at a sampling rate of 500Hz  and reviewed with a high frequency filter of 70Hz  and a low frequency filter of 1Hz . EEG data were recorded continuously and digitally stored.  DESCRIPTION:EEG showed continuous generalized 2-3Hz  low amplitude delta slowing.  EEG was reactive to tactile stimulation. Hyperventilation and photic stimulation were not performeddue to ams.   ABNORMALITY  - Continuous slow, generalized  IMPRESSION: This studyshowed evidence of severe diffuse encephalopathy, non specific to etiology but most likely secondary to sedation.  No seizures or definite epileptiform discharges were seen during the study.  EEG appears significantly improved compared to previous study.    Delphin Funes 

## 2019-07-25 NOTE — Progress Notes (Signed)
Patient ID: Eddie Torres, male   DOB: 1961-02-23, 59 y.o.   MRN: [id number] Follow up - Trauma Critical Care  Patient Details:    Eddie Torres is an 59 y.o. male.  Lines/tubes : Airway 7.5 mm (Active)  Secured at (cm) 25 cm 07/25/19 0315  Measured From Lips 07/25/19 0315  Secured Location Right 07/25/19 0315  Secured By [employer] 07/25/19 0315  Tube Holder Repositioned Yes 07/25/19 0315  Cuff Pressure (cm H2O) 30 cm H2O 07/23/19 1916  Site Condition Dry 07/25/19 0315     NG/OG Tube Orogastric Center mouth Aucultation (Active)  External Length of Tube (cm) - (if applicable) 56 cm 07/24/19 2000  Site Assessment Clean;Dry;Intact 07/24/19 2000  Ongoing Placement Verification No change in cm markings or external length of tube from initial placement;No change in respiratory status;No acute changes, not attributed to clinical condition 07/24/19 2000  Status Clamped 07/24/19 2000  Amount of suction 110 mmHg 07/22/19 0800  Drainage Appearance Bile 07/22/19 2000  Output (mL) 200 mL 07/23/19 0600     External Urinary Catheter (Active)    Microbiology/Sepsis markers: Results for orders placed or performed during the hospital encounter of August 14, 2019  Respiratory Panel by RT PCR (Flu A&B, Covid) - Nasopharyngeal Swab     Status: None   Collection Time: 08/14/19  4:14 PM   Specimen: Nasopharyngeal Swab  Result Value Ref Range Status   SARS Coronavirus 2 by RT PCR NEGATIVE NEGATIVE Final    Comment: (NOTE) SARS-CoV-2 target nucleic acids are NOT DETECTED. The SARS-CoV-2 RNA is generally detectable in upper respiratoy specimens during the acute phase of infection. The lowest concentration of SARS-CoV-2 viral copies this assay can detect is 131 copies/mL. A negative result does not preclude SARS-Cov-2 infection and should not be used as the sole basis for treatment or other patient management decisions. A negative result may occur with  improper specimen  collection/handling, submission of specimen other than nasopharyngeal swab, presence of viral mutation(s) within the areas targeted by this assay, and inadequate number of viral copies (<131 copies/mL). A negative result must be combined with clinical observations, patient history, and epidemiological information. The expected result is Negative. Fact Sheet for Patients:  https://www.moore.com/ Fact Sheet for Healthcare Providers:  https://www.young.biz/ This test is not yet ap proved or cleared by the [place] FDA and  has been authorized for detection and/or diagnosis of SARS-CoV-2 by FDA under an Emergency Use Authorization (EUA). This EUA will remain  in effect (meaning this test can be used) for the duration of the COVID-19 declaration under Section 564(b)(1) of the Act, 21 U.S.C. section 360bbb-3(b)(1), unless the authorization is terminated or revoked sooner.    Influenza A by PCR NEGATIVE NEGATIVE Final   Influenza B by PCR NEGATIVE NEGATIVE Final    Comment: (NOTE) The Xpert Xpress SARS-CoV-2/FLU/RSV assay is intended as an aid in  the diagnosis of influenza from Nasopharyngeal swab specimens and  should not be used as a sole basis for treatment. Nasal washings and  aspirates are unacceptable for Xpert Xpress SARS-CoV-2/FLU/RSV  testing. Fact Sheet for Patients: https://www.moore.com/ Fact Sheet for Healthcare Providers: https://www.young.biz/ This test is not yet approved or cleared by the [place] FDA and  has been authorized for detection and/or diagnosis of SARS-CoV-2 by  FDA under an Emergency Use Authorization (EUA). This EUA will remain  in effect (meaning this test can be used) for the duration of the  Covid-19 declaration under Section 564(b)(1) of the Act, 21  U.S.C. section 360bbb-3(b)(1), unless the authorization is  terminated or revoked. Performed at [facility], [address] [place], [place] [address]   MRSA PCR Screening     Status: None   Collection Time: 06/23/2019  6:29 PM   Specimen: Nasopharyngeal  Result Value Ref Range Status   MRSA by PCR NEGATIVE NEGATIVE Final    Comment:        The GeneXpert MRSA Assay (FDA approved for NASAL specimens only), is one component of a comprehensive MRSA colonization surveillance program. It is not intended to diagnose MRSA infection nor to guide or monitor treatment for MRSA infections. Performed at [facility], [address] [place], [place] [address]   Culture, blood (routine x 2)     Status: None (Preliminary result)   Collection Time: 07/23/19 11:03 PM   Specimen: BLOOD  Result Value Ref Range Status   Specimen Description BLOOD LEFT ANTECUBITAL  Final   Special Requests   Final    BOTTLES DRAWN AEROBIC ONLY Blood Culture adequate volume   Culture NO GROWTH 2 DAYS  Final   Report Status PENDING  Incomplete  Culture, blood (routine x 2)     Status: None (Preliminary result)   Collection Time: 07/23/19 11:08 PM   Specimen: BLOOD  Result Value Ref Range Status   Specimen Description BLOOD LEFT ANTECUBITAL  Final   Special Requests   Final    BOTTLES DRAWN AEROBIC ONLY Blood Culture adequate volume   Culture NO GROWTH 2 DAYS  Final   Report Status PENDING  Incomplete    Anti-infectives:  Anti-infectives (From admission, onward)   Start     Dose/Rate Route Frequency Ordered Stop   07/24/19 1000  vancomycin (VANCOCIN) IVPB 1000 mg/200 mL premix     1,000 mg 200 mL/hr over 60 Minutes Intravenous Every 12 hours 07/23/19 2309     07/23/19 2300  vancomycin (VANCOREADY) IVPB 1500 mg/300 mL     1,500 mg 150 mL/hr over 120 Minutes Intravenous  Once 07/23/19 2245 07/24/19 0445   07/23/19 2300  piperacillin-tazobactam (ZOSYN) IVPB 3.375 g     3.375 g 12.5 mL/hr over 240 Minutes Intravenous Every 8 hours 07/23/19 2245     07/11/2019 1645  ceFAZolin (ANCEF) IVPB 2g/100 mL premix     2  g 200 mL/hr over 30 Minutes Intravenous  Once 06/24/2019 1630 07/07/2019 1717      Best Practice/Protocols:  VTE Prophylaxis: Mechanical Continous Sedation  Consults: Treatment Team:  Lisbeth Renshaw, MD   Subjective:    Overnight Issues:   Objective:  Vital signs for last 24 hours: Temp:  [98.5 F (36.9 C)-99.9 F (37.7 C)] 98.6 F (37 C) (02/02 0000) Pulse Rate:  [65-107] 73 (02/02 0700) Resp:  [10-25] 24 (02/02 0700) BP: (91-207)/(68-98) 207/95 (02/02 0815) SpO2:  [97 %-100 %] 100 % (02/02 0700) Arterial Line BP: (91-221)/(38-99) 156/76 (02/02 0700) FiO2 (%):  [40 %] 40 % (02/02 0315) Weight:  [98.1 kg] 98.1 kg (02/02 0500)  Hemodynamic parameters for last 24 hours:    Intake/Output from previous day: 02/01 0701 - 02/02 0700 In: 3632.4 [I.V.:2581.2; IV Piggyback:1051.2] Out: 970 [Urine:970]  Intake/Output this shift: No intake/output data recorded.  Vent settings for last 24 hours: Vent Mode: PRVC FiO2 (%):  [40 %] 40 % Set Rate:  [24 bmp] 24 bmp Vt Set:  [510 mL] 510 mL PEEP:  [5 cmH20] 5 cmH20 Plateau Pressure:  [18 cmH20-22 cmH20] 19 cmH20  Physical Exam:  General: on vent Neuro: arouses and F/C with RUE, not moving BLE much HEENT/Neck: ETT Resp: few rhonchi CVS: RRR GI: distended but not tympanic, soft, a few BS Extremities: edema 1+  Results for orders placed or performed during the hospital encounter of 11-Aug-2019 (from the past 24 hour(s))  Urinalysis, Routine w reflex microscopic     Status: Abnormal   Collection Time: 07/24/19 10:31 AM  Result Value Ref Range   Color, Urine YELLOW YELLOW   APPearance CLEAR CLEAR   Specific Gravity, Urine 1.032 (H) 1.005 - 1.030   pH 5.0 5.0 - 8.0   Glucose, UA NEGATIVE NEGATIVE mg/dL   Hgb urine dipstick NEGATIVE NEGATIVE   Bilirubin Urine NEGATIVE NEGATIVE   Ketones, ur 5 (A) NEGATIVE mg/dL   Protein, ur NEGATIVE NEGATIVE mg/dL   Nitrite NEGATIVE NEGATIVE   Leukocytes,Ua NEGATIVE NEGATIVE  Glucose,  capillary     Status: Abnormal   Collection Time: 07/24/19 11:47 AM  Result Value Ref Range   Glucose-Capillary 145 (H) 70 - 99 mg/dL   Comment 1 Notify RN    Comment 2 Document in Chart   Phenytoin level, total     Status: None   Collection Time: 07/24/19  1:00 PM  Result Value Ref Range   Phenytoin Lvl 10.9 10.0 - 20.0 ug/mL  Comprehensive metabolic panel     Status: Abnormal   Collection Time: 07/24/19  1:00 PM  Result Value Ref Range   Sodium 135 135 - 145 mmol/L   Potassium 4.5 3.5 - 5.1 mmol/L   Chloride 105 98 - 111 mmol/L   CO2 19 (L) 22 - 32 mmol/L   Glucose, Bld 153 (H) 70 - 99 mg/dL   BUN 11 6 - 20 mg/dL   Creatinine, Ser 0.82 0.61 - 1.24 mg/dL   Calcium 7.7 (L) 8.9 - 10.3 mg/dL   Total Protein 5.1 (L) 6.5 - 8.1 g/dL   Albumin 2.5 (L) 3.5 - 5.0 g/dL   AST 56 (H) 15 - 41 U/L   ALT 34 0 - 44 U/L   Alkaline Phosphatase 45 38 - 126 U/L   Total Bilirubin 1.0 0.3 - 1.2 mg/dL   GFR calc non Af Amer >60 >60 mL/min   GFR calc Af Amer >60 >60 mL/min   Anion gap 11 5 - 15  Glucose, capillary     Status: Abnormal   Collection Time: 07/24/19  3:53 PM  Result Value Ref Range   Glucose-Capillary 113 (H) 70 - 99 mg/dL  Glucose, capillary     Status: Abnormal   Collection Time: 07/24/19  7:52 PM  Result Value Ref Range   Glucose-Capillary 114 (H) 70 - 99 mg/dL  Glucose, capillary     Status: Abnormal   Collection Time: 07/24/19 11:29 PM  Result Value Ref Range   Glucose-Capillary 149 (H) 70 - 99 mg/dL  Glucose, capillary     Status: Abnormal   Collection Time: 07/25/19  3:49 AM  Result Value Ref Range   Glucose-Capillary 136 (H) 70 - 99 mg/dL  Triglycerides     Status: Abnormal   Collection Time: 07/25/19  5:26 AM  Result Value Ref Range   Triglycerides 157 (H) <150 mg/dL  Glucose, capillary     Status: Abnormal   Collection Time: 07/25/19  8:01 AM  Result Value Ref Range   Glucose-Capillary 125 (H) 70 - 99 mg/dL   Comment 1 Notify RN    Comment 2 Document in Chart  Assessment & Plan: Present on Admission: . ICH (intracerebral hemorrhage) ([facility])    LOS: 4 days   Additional comments:I reviewed the patient's new clinical lab test results. . Fall down 10 stairs  B/l SDH/SAH, Large R frontal contusion, tentorial and falcine SDH-NSGY on board (Dr. Conchita Paris), CT 1/31 with slightly worsened R frontal IPH and new bi-temporoparietal infarctions 2/2 vasospasm. Possibly will need MRI/MRA when more stable and off cEEG.  L occipital skull fx, L temporal bone fx with blood in L middle ear- ENT on board (Dr Annalee Genta), recs for ciprodex OTIC BID for the duration of hospitalization. Will need audiogram. Seizures - on cEEG. Status epilepticus 1/31, now out of status, continuous EEG, Keppra/dilantin/Vimpat. Also is on versed drip. Epilepsy specialist following. Acute hypoxic ventilator dependent respiratory failure- will see if he will wean, will not extubate yet Pneumocephalus - abx Scalp laceration- stapled in ED Possible T1 fracture- maintain c-collar for now per NSGY, most likely degenerative.  L 10th rib fracture, old rib fractures L 9-10- pain control HTN, bipolar disorder EtOH abuse- CIWA, 354 on admit Thrombocytopenia - labs now FEN - start trickle TF as has some ileus. Continue klon/sero VTE- PAS, hold LMWH in light of thrombocytopenia - CBC P ID - empiric vanc/zosyn, blood cx NGTD, with aspiration on induction will check resp CX, CBC pending Dispo- ICU Critical Care Total Time*: 38 Minutes  Violeta Gelinas, MD, MPH, FACS Trauma & General Surgery Use [email or web address] to contact on call provider  07/25/2019  *Care during the described time interval was provided by me. I have reviewed this patient's available data, including medical history, events of note, physical examination and test results as part of my evaluation.

## 2019-07-25 NOTE — Progress Notes (Signed)
Nutrition Follow-up  *RD working remotely*  DOCUMENTATION CODES:   Obesity unspecified  INTERVENTION:   Monitor magnesium, potassium, and phosphorus daily for at least 3 days, MD to replete as needed, as pt is at risk for refeeding syndrome.  Initiate TF via tube with Vital High Protein at goal rate of 40 ml/h (960 ml per day) and Prostat 30 ml TID to provide 1260 kcals, 129 gm protein, 802 ml free water daily.  Recommend free water flushes 38m Q4 (or per MD)  Continue MVI, thiamine, and folic acid daily in setting of EtOH abuse.   NUTRITION DIAGNOSIS:   Inadequate oral intake related to inability to eat as evidenced by NPO status.  Ongoing.  GOAL:   Provide needs based on ASPEN/SCCM guidelines  Not met.  MONITOR:   Vent status, TF tolerance, Skin, Weight trends, I & O's, Labs  REASON FOR ASSESSMENT:   Consult Enteral/tube feeding initiation and management  ASSESSMENT:   Pt with a PMH significant for EtOH abuse, HTN, bipolar disorder admitted s/p fall with TBI and found to have L skull fractures, L temporal bone fractures, possible T1 fracture, rib fractures, pulmonary contusions, bilateral SDHs, and bilateral temporo-parietal periventricular infarctions   Discussed pt with RN.   No prior wt hx available in chart.   Medications reviewed and include: Colace, Folvite, MVI liquid, Pivot 1.5 Cal @ 118mhour, IV abx, Thiamine  Labs reviewed. CBGs 125-136  UOP: 97064m24 hours I/O: 8,909.1mL67mnce admit  Patient is currently intubated on ventilator support MV: 12.4 L/min Temp (24hrs), Avg:98.9 F (37.2 C), Min:97.4 F (36.3 C), Max:99.9 F (37.7 C)  Propofol: 0 ml/hr  NUTRITION - FOCUSED PHYSICAL EXAM:  Deferred; RD working remotely.  Diet Order:   Diet Order            Diet NPO time specified  Diet effective now              EDUCATION NEEDS:   Not appropriate for education at this time  Skin:  Skin Assessment: Reviewed RN  Assessment  Last BM:  PTA  Height:   Ht Readings from Last 1 Encounters:  07/22/2019 5' 6"  (1.676 m)    Weight:   Wt Readings from Last 1 Encounters:  07/25/19 98.1 kg    Ideal Body Weight:  64.5 kg  BMI:  Body mass index is 34.91 kg/m.  Estimated Nutritional Needs:   Kcal:  [id number]:  >129g/d  Fluid:  1.9-2.2L/day   Eddie Torres, RD, LDN Pager: [phone]/After Hours Pager: [phone]

## 2019-07-25 NOTE — Progress Notes (Addendum)
Subjective: No acute events overnight.  No further seizures overnight.  Patient's daughter at bedside.  ROS: unable to obtain due to poor mental status  Examination  Vital signs in last 24 hours: Temp:  [97.4 F (36.3 C)-99.3 F (37.4 C)] 97.4 F (36.3 C) (02/02 0800) Pulse Rate:  [65-109] 88 (02/02 1100) Resp:  [10-25] 15 (02/02 1100) BP: (91-207)/(68-101) 149/90 (02/02 1000) SpO2:  [93 %-100 %] 98 % (02/02 1135) Arterial Line BP: (91-221)/(38-99) 127/63 (02/02 1100) FiO2 (%):  [30 %-40 %] 30 % (02/02 1135) Weight:  [98.1 kg] 98.1 kg (02/02 0500)  General: lying in bed, not in apparent distress CVS: pulse-normal rate and rhythm RS: breathing comfortably, intubated Extremities: normal, warm  Neuro: On Precedex MS: Winces to noxious stimuli but does not open eyes, did not follow commands for me but per RN patient did follow simple commands earlier when he was off sedation.  CN: pupils equal and reactive, oculocephalic reflex intact, corneal reflex intact, gag reflex intact, unable to assess rest of the cranial nerves due to intubation  Motor: Withdraws to noxious stimuli with antigravity strength in right upper extremity and right lower extremity, withdraws to noxious stimuli with 2/5 strength in left lower extremity, does not withdraw to noxious stimuli in left upper extremity. Reflexes: 3+ right patellar reflex, bilateral upgoing toes , 1+ reflex in all other extremities.  Basic Metabolic Panel: Recent Labs  Lab 07/22/19 0235 07/22/19 0235 07/22/19 0408 07/23/19 0239 07/23/19 0239 07/24/19 0350 07/24/19 1300 07/25/19 0949  NA 136   < > 137 137  --  136 135 137  K 3.7   < > 4.3 4.1  --  4.1 4.5 3.5  CL 101  --   --  105  --  105 105 105  CO2 24  --   --  23  --  21* 19* 20*  GLUCOSE 144*  --   --  154*  --  109* 153* 170*  BUN 11  --   --  13  --  12 11 8   CREATININE 0.81  --   --  0.90  --  0.87 0.82 0.78  CALCIUM 7.7*   < >  --  7.7*   < > 7.8* 7.7* 7.9*  MG 1.4*  --    --  2.0  --  2.3  --  2.0  PHOS 3.8  --   --  2.4*  --  2.0*  --  3.1   < > = values in this interval not displayed.    CBC: Recent Labs  Lab 07/23/2019 1559 07/11/2019 1611 07/22/19 0235 07/22/19 0408 07/23/19 0239 07/24/19 0350 07/25/19 0949  WBC 8.6  --  16.8*  --  11.6* 9.9 12.2*  HGB 15.1   < > 12.1* 11.6* 10.0* 9.7* 9.9*  HCT 44.1   < > 35.4* 34.0* 30.2* 29.3* 30.0*  MCV 100.2*  --  99.7  --  103.8* 103.2* 103.4*  PLT 169  --  155  --  93* 79* 139*   < > = values in this interval not displayed.     Coagulation Studies: No results for input(s): LABPROT, INR in the last 72 hours.  Imaging  CT head 07/23/2019: No evidence of much additional intracranial bleeding. Fairly stable patterns of extensive hemorrhagic contusions, subarachnoid blood and subdural blood. There may be slightly more intraparenchymal bleeding in the right frontal region. No evidence of herniation or midline shift.  Newly seen focal low densities at the  temporoparietal junctions bilaterally, favored to represent infarctions subsequent to vasospasm.   ASSESSMENT AND PLAN: 59 yo male with unknown PMH who presented to [facility] as a trauma after a fall down 10 steps.Extensive subdural hemorrhages bilaterally and by the falx. Also noted traumatic subarachnoid bleed. 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  - Acute encephalopathy secondary to trauma leading to SDH  and SAH, toxic- metabolic ( status epilepticus and sedation from treating meds)  Recommendations - LTM EEG did not show any further seizures since yesterday morning.  Will continue LTM eeg for 24 hours to look for any seizures as sedation was completely turned off this morning. -Stop Versed infusion this morning. - continue AEDs as follows  1.  Keppra 1500 mg twice daily  2.  Dilantin 100 mg every 8 hours  3.  Vimpat 100 mg twice daily  4.  Clonazepam 2 mg every 8 hours  - If needed can increase Vimpat to 150mg  BID -We will obtain MRI brain without contrast for better evaluation of acute intracranial abnormalities after EEG is discontinued, possibly tomorrow - continue seizure precautions - PRN IV versed 5mg  for clinical seizure activity -Updated patient's daughter about current neurologic status and prognosis. - management of other comorbidities including thrombocytopenia by primary team    CRITICAL CARE Performed by: Lora Havens   Total critical care time: 45 minutes  Critical care time was exclusive of separately billable procedures and treating other patients.  Critical care was necessary to treat or prevent imminent or life-threatening deterioration.  Critical care was time spent personally by me on the following activities: development of treatment plan with patient and/or surrogate as well as nursing, discussions with consultants, evaluation of patient's response to treatment, examination of patient, obtaining history from patient or surrogate, ordering and performing treatments and interventions, ordering and review of laboratory studies, ordering and review of radiographic studies, pulse oximetry and re-evaluation of patient's condition.

## 2019-07-25 NOTE — Progress Notes (Signed)
  NEUROSURGERY PROGRESS NOTE   No issues overnight.  No seizures since 0657 yesterday. Weaned off propofol  EXAM:  BP (!) 141/97   Pulse 73   Temp 98.6 F (37 C) (Oral)   Resp (!) 24   Ht 5\' 6"  (1.676 m)   Wt 98.1 kg   SpO2 100%   BMI 34.91 kg/m   Intubated Opens eyes to voice Pupils 72mm reactive Not following commands Purposeful BUE  IMPRESSION/PLAN 59 y.o. male s/pfall with TBI, bilateral temporo-parietal periventricular infarctions. No further seizure activity. - continue AED per neurology - continue supportive care - no new NS recs

## 2019-07-26 LAB — CBC
HCT: 29.8 % — ABNORMAL LOW (ref 39.0–52.0)
Hemoglobin: 10.1 g/dL — ABNORMAL LOW (ref 13.0–17.0)
MCH: 34.8 pg — ABNORMAL HIGH (ref 26.0–34.0)
MCHC: 33.9 g/dL (ref 30.0–36.0)
MCV: 102.8 fL — ABNORMAL HIGH (ref 80.0–100.0)
Platelets: 128 10*3/uL — ABNORMAL LOW (ref 150–400)
RBC: 2.9 MIL/uL — ABNORMAL LOW (ref 4.22–5.81)
RDW: 12.4 % (ref 11.5–15.5)
WBC: 8.8 10*3/uL (ref 4.0–10.5)
nRBC: 0.5 % — ABNORMAL HIGH (ref 0.0–0.2)

## 2019-07-26 LAB — GLUCOSE, CAPILLARY
Glucose-Capillary: 161 mg/dL — ABNORMAL HIGH (ref 70–99)
Glucose-Capillary: 119 mg/dL — ABNORMAL HIGH (ref 70–99)
Glucose-Capillary: 162 mg/dL — ABNORMAL HIGH (ref 70–99)
Glucose-Capillary: 151 mg/dL — ABNORMAL HIGH (ref 70–99)
Glucose-Capillary: 149 mg/dL — ABNORMAL HIGH (ref 70–99)
Glucose-Capillary: 134 mg/dL — ABNORMAL HIGH (ref 70–99)

## 2019-07-26 LAB — BASIC METABOLIC PANEL
Anion gap: 8 (ref 5–15)
BUN: 13 mg/dL (ref 6–20)
CO2: 22 mmol/L (ref 22–32)
Calcium: 8.3 mg/dL — ABNORMAL LOW (ref 8.9–10.3)
Chloride: 110 mmol/L (ref 98–111)
Creatinine, Ser: 0.75 mg/dL (ref 0.61–1.24)
GFR calc Af Amer: >60 mL/min (ref >60)
GFR calc non Af Amer: >60 mL/min (ref >60)
Glucose, Bld: 156 mg/dL — ABNORMAL HIGH (ref 70–99)
Potassium: 3.4 mmol/L — ABNORMAL LOW (ref 3.5–5.1)
Sodium: 140 mmol/L (ref 135–145)

## 2019-07-26 LAB — TRIGLYCERIDES: Triglycerides: 180 mg/dL — ABNORMAL HIGH (ref <150)

## 2019-07-26 LAB — PHOSPHORUS
Phosphorus: 2.5 mg/dL (ref 2.5–4.6)
Phosphorus: 3.4 mg/dL (ref 2.5–4.6)

## 2019-07-26 LAB — MAGNESIUM
Magnesium: 2.2 mg/dL (ref 1.7–2.4)
Magnesium: 2 mg/dL (ref 1.7–2.4)

## 2019-07-26 MED ORDER — POTASSIUM CHLORIDE 20 MEQ/15ML (10%) PO SOLN
40.0000 meq | Freq: Once | ORAL | Status: DC
  Filled 2019-07-26: qty 30

## 2019-07-26 MED ORDER — POTASSIUM CHLORIDE 20 MEQ/15ML (10%) PO SOLN
40.0000 meq | Freq: Every day | ORAL | Status: DC
  Administered 2019-07-26 – 2019-07-29 (×4): 40 meq
  Filled 2019-07-26 (×3): qty 30

## 2019-07-26 MED ORDER — FUROSEMIDE 10 MG/ML IJ SOLN
40.0000 mg | Freq: Once | INTRAMUSCULAR | Status: AC
  Administered 2019-07-26: 40 mg via INTRAVENOUS
  Filled 2019-07-26: qty 4

## 2019-07-26 NOTE — Procedures (Addendum)
Patient Name:Eddie Torres OFH:[id number] Epilepsy Attending:Destyne Goodreau [name] Referring Physician/Provider:Dr Milon Dikes Duration:07/25/2019 2052 to 07/26/2019 0900  Patient history:59 yo [name] presented after fall down 10 stairs. CT head showedBL SDH/SAH, Large R frontal contusion, Tentorial and falcine SDH. EEG to evaluate for seizure.  Level of alertness:comatose  AEDs during EEG study:Clonazepam, LEV, LCM, PHT  Technical aspects: This EEG study was done with scalp electrodes positioned according to the 10-20 International system of electrode placement. Electrical activity was acquired at a sampling rate of 500Hz  and reviewed with a high frequency filter of 70Hz  and a low frequency filter of 1Hz . EEG data were recorded continuously and digitally stored.  DESCRIPTION:EEGshowed continuous generalized 2-3Hz  low amplitude delta slowing.  EEG was reactive to tactile stimulation.Hyperventilation and photic stimulation were not performeddue to ams.  ABNORMALITY  - Continuous slow, generalized  IMPRESSION: This studyshowed evidence of severediffuse encephalopathy, non specific to etiology but most likely secondary to sedation.  No seizures or definite epileptiform discharges were seen during the study.  EEG appears similar to previous day.  Mohamedamin Nifong 

## 2019-07-26 NOTE — Progress Notes (Signed)
LTM EEG discontinued - no skin breakdown at unhook.   

## 2019-07-26 NOTE — Progress Notes (Signed)
Subjective: NAEO. Off sedation since yesterday morning.   ROS: unable to obtain due to poor mental status  Examination  Vital signs in last 24 hours: Temp:  [97.5 F (36.4 C)-98 F (36.7 C)] 97.8 F (36.6 C) (02/03 0800) Pulse Rate:  [62-95] 95 (02/03 0800) Resp:  [15-24] 23 (02/03 0800) BP: (109-156)/(74-93) 145/93 (02/03 1000) SpO2:  [96 %-100 %] 96 % (02/03 0800) Arterial Line BP: (112-155)/(58-80) 146/74 (02/03 0700) FiO2 (%):  [30 %] 30 % (02/03 0800) Weight:  [98 kg] 98 kg (02/03 0500)  General: lying in bed, not in apparent distress CVS: pulse-normal rate and rhythm RS: breathing comfortably, intubated Extremities: normal, warm  Neuro: On Precedex, fentanyl MS: opens eyes to noxious stimuli, able to squeeze with both hands but did not consistently follow commands for me, didn't wiggle toes CN: pupils equal and reactive, oculocephalic reflex intact, corneal reflex intact, gag reflex intact, unable to assess rest of the cranial nerves due to intubation  Motor: Withdraws to noxious stimuli with antigravity strength in right upper extremity and right lower extremity, withdraws to noxious stimuli with 2/5 strength in left lower extremity,withdraws to noxious stimuli with 1/5 strength in left upper extremity. Reflexes: bilateral upgoing toes   Basic Metabolic Panel: Recent Labs  Lab 07/23/19 0239 07/23/19 0239 07/24/19 0350 07/24/19 0350 07/24/19 1300 07/25/19 0949 07/25/19 1625 07/26/19 0637  NA 137  --  136  --  135 137  --  140  K 4.1  --  4.1  --  4.5 3.5  --  3.4*  CL 105  --  105  --  105 105  --  110  CO2 23  --  21*  --  19* 20*  --  22  GLUCOSE 154*  --  109*  --  153* 170*  --  156*  BUN 13  --  12  --  11 8  --  13  CREATININE 0.90  --  0.87  --  0.82 0.78  --  0.75  CALCIUM 7.7*   < > 7.8*   < > 7.7* 7.9*  --  8.3*  MG 2.0  --  2.3  --   --  2.0 2.1 2.2  PHOS 2.4*  --  2.0*  --   --  3.1 3.0 2.5   < > = values in this interval not displayed.     CBC: Recent Labs  Lab 07/22/19 0235 07/22/19 0235 07/22/19 0408 07/23/19 0239 07/24/19 0350 07/25/19 0949 07/26/19 0637  WBC 16.8*  --   --  11.6* 9.9 12.2* 8.8  HGB 12.1*   < > 11.6* 10.0* 9.7* 9.9* 10.1*  HCT 35.4*   < > 34.0* 30.2* 29.3* 30.0* 29.8*  MCV 99.7  --   --  103.8* 103.2* 103.4* 102.8*  PLT 155  --   --  93* 79* 139* 128*   < > = values in this interval not displayed.     Coagulation Studies: No results for input(s): LABPROT, INR in the last 72 hours.  Imaging CT head 07/23/2019:No evidence of much additional intracranial bleeding. Fairly stable patterns of extensive hemorrhagic contusions, subarachnoid blood and subdural blood. There may be slightly more intraparenchymal bleedingin the right frontal region. No evidence of herniation or midline shift.  Newly seen focal low densities at the temporoparietal junctions bilaterally, favored to represent infarctions subsequent to vasospasm.   ASSESSMENT AND PLAN:59 yo malewith unknown PMH who [facility] as a [facility] after a fall down  10 steps.Extensive subdural hemorrhages bilaterally and by the falx. Also noted traumatic subarachnoid bleed. 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  - Acute encephalopathy secondary to trauma leading to SDH and SAH, toxic- metabolic (seizures and sedation from treating  meds)  Recommendations - DC LTM EEG as patient has been seizure free since 07/24/2019 0657 - continue AEDs as follows             1.  Keppra 1500 mg twice daily             2.  Dilantin 100 mg every 8 hours             3.  Vimpat 100 mg twice daily             4.  Clonazepam 2 mg every 8 hours - If neededcanincrease Vimpat to 150mg  BID - Will consider weaning off one AED after few days if patient remains seizure free -We will obtain MRI brain without contrast for better evaluation of acute intracranial abnormalities - continue seizure precautions - PRN IV versed 5mg  for clinical seizure activity - management of other comorbidities by primary team   CRITICAL CARE Performed by: Lora Havens   Total critical care time: 40 minutes  Critical care time was exclusive of separately billable procedures and treating other patients.  Critical care was necessary to treat or prevent imminent or life-threatening deterioration.  Critical care was time spent personally by me on the following activities: development of treatment plan with patient and/or surrogate as well as nursing, discussions with consultants, evaluation of patient's response to treatment, examination of patient, obtaining history from patient or surrogate, ordering and performing treatments and interventions, ordering and review of laboratory studies, ordering and review of radiographic studies, pulse oximetry and re-evaluation of patient's condition.

## 2019-07-26 NOTE — Progress Notes (Signed)
LTM maint complete - no skin breakdown under:  KB5,CY8,L8,H9

## 2019-07-26 NOTE — Progress Notes (Signed)
  NEUROSURGERY PROGRESS NOTE   No issues overnight.   EXAM:  BP (!) 156/81   Pulse 73   Temp (!) 97.5 F (36.4 C) (Axillary)   Resp 18   Ht 5\' 6"  (1.676 m)   Wt 98 kg   SpO2 97%   BMI 34.87 kg/m   Intubated On precedex Opens eyes to voice Pupils 62mm, reactive followed commands yesterday per nursing. Not following commands with me today Remains purposeful with bilateral UE  IMPRESSION/PLAN 59 y.o. male s/pfall with severe TBI, bilateral temporo-parietal periventricular infarctions. No further seizure activity. Intermittently following commands per nursing. - continue AED per neurology, MRI - continue supportive care - no new NS recs

## 2019-07-26 NOTE — Progress Notes (Signed)
I responded to consult for pastoral support. Tim was unresponsive and daughter Danelle Earthly was at bedside. I offerred Noel space to voice concerns and questions. Danelle Earthly stated she is having trouble with her dads health. She stated he was practicing [place] before he hit his depression which led to his drinking. Also, her sister Rosalyn Gess) called and face-timed. Rosalyn Gess requested prayer for [employer]. I offered spiritual care with words of comfort, empathic listening, ministry of presence, and prayer. Danelle Earthly stated that she will let Nurse know if spiritual care is needed. She thanked Chaplain for the visit and care he has received so far.  Chaplain available as needed.   Chaplain Resident Orest Dikes MA [phone]

## 2019-07-27 ENCOUNTER — Inpatient Hospital Stay (HOSPITAL_COMMUNITY): Payer: Self-pay

## 2019-07-27 DIAGNOSIS — S06369D Traumatic hemorrhage of cerebrum, unspecified, with loss of consciousness of unspecified duration, subsequent encounter: Secondary | ICD-10-CM

## 2019-07-27 DIAGNOSIS — S0990XS Unspecified injury of head, sequela: Secondary | ICD-10-CM

## 2019-07-27 LAB — CBC
HCT: 29.8 % — ABNORMAL LOW (ref 39.0–52.0)
Hemoglobin: 9.8 g/dL — ABNORMAL LOW (ref 13.0–17.0)
MCH: 33.8 pg (ref 26.0–34.0)
MCHC: 32.9 g/dL (ref 30.0–36.0)
MCV: 102.8 fL — ABNORMAL HIGH (ref 80.0–100.0)
Platelets: 161 10*3/uL (ref 150–400)
RBC: 2.9 MIL/uL — ABNORMAL LOW (ref 4.22–5.81)
RDW: 12.7 % (ref 11.5–15.5)
WBC: 8.9 10*3/uL (ref 4.0–10.5)
nRBC: 0 % (ref 0.0–0.2)

## 2019-07-27 LAB — CULTURE, RESPIRATORY W GRAM STAIN: Culture: NORMAL

## 2019-07-27 LAB — BASIC METABOLIC PANEL
Anion gap: 9 (ref 5–15)
BUN: 18 mg/dL (ref 6–20)
CO2: 26 mmol/L (ref 22–32)
Calcium: 8.7 mg/dL — ABNORMAL LOW (ref 8.9–10.3)
Chloride: 111 mmol/L (ref 98–111)
Creatinine, Ser: 0.85 mg/dL (ref 0.61–1.24)
GFR calc Af Amer: >60 mL/min (ref >60)
GFR calc non Af Amer: >60 mL/min (ref >60)
Glucose, Bld: 149 mg/dL — ABNORMAL HIGH (ref 70–99)
Potassium: 3.2 mmol/L — ABNORMAL LOW (ref 3.5–5.1)
Sodium: 146 mmol/L — ABNORMAL HIGH (ref 135–145)

## 2019-07-27 LAB — GLUCOSE, CAPILLARY
Glucose-Capillary: 152 mg/dL — ABNORMAL HIGH (ref 70–99)
Glucose-Capillary: 138 mg/dL — ABNORMAL HIGH (ref 70–99)
Glucose-Capillary: 138 mg/dL — ABNORMAL HIGH (ref 70–99)
Glucose-Capillary: 149 mg/dL — ABNORMAL HIGH (ref 70–99)
Glucose-Capillary: 120 mg/dL — ABNORMAL HIGH (ref 70–99)
Glucose-Capillary: 158 mg/dL — ABNORMAL HIGH (ref 70–99)

## 2019-07-27 MED ORDER — METOPROLOL TARTRATE 25 MG/10 ML ORAL SUSPENSION
25.0000 mg | Freq: Two times a day (BID) | ORAL | Status: DC
  Administered 2019-07-27 (×2): 25 mg
  Filled 2019-07-27 (×2): qty 10

## 2019-07-27 MED ORDER — CLONIDINE HCL 0.2 MG/24HR TD PTWK
0.2000 mg | MEDICATED_PATCH | TRANSDERMAL | Status: DC
  Administered 2019-07-27: 0.2 mg via TRANSDERMAL
  Filled 2019-07-27: qty 1

## 2019-07-27 MED ORDER — PIVOT 1.5 CAL PO LIQD
1000.0000 mL | ORAL | Status: DC
  Administered 2019-07-27: 1000 mL
  Filled 2019-07-27 (×3): qty 1000

## 2019-07-27 MED ORDER — BETHANECHOL CHLORIDE 10 MG PO TABS
25.0000 mg | ORAL_TABLET | Freq: Three times a day (TID) | ORAL | Status: DC
  Administered 2019-07-27 – 2019-07-29 (×6): 25 mg
  Filled 2019-07-27 (×6): qty 3

## 2019-07-27 MED ORDER — POTASSIUM CHLORIDE 20 MEQ/15ML (10%) PO SOLN
40.0000 meq | Freq: Once | ORAL | Status: AC
  Administered 2019-07-27: 40 meq via ORAL
  Filled 2019-07-27: qty 30

## 2019-07-27 MED ORDER — ENOXAPARIN SODIUM 40 MG/0.4ML ~~LOC~~ SOLN
40.0000 mg | SUBCUTANEOUS | Status: DC
  Administered 2019-07-27 – 2019-07-29 (×3): 40 mg via SUBCUTANEOUS
  Filled 2019-07-27 (×4): qty 0.4

## 2019-07-27 NOTE — Progress Notes (Signed)
RT NOTE: RT transported patient on ventilator from room 4N28 to MRI and back to room 4N28 with no apparent complications. Vitals are stable. RT will continue to monitor.

## 2019-07-27 NOTE — Progress Notes (Signed)
Patient ID: Eddie Torres, male   DOB: 1961/01/19, 59 y.o.   MRN: [id number] I met with his daughter, Eddie Torres and Eddie Torres from the Palliative team. His daughter, Eddie Torres, joined on the phone. We discussed his current condition, his progress, and the plan of care. I spoke about the decision for trach/PEG if he remains on the ventilator next week. We also discussed that he may need long term care. They are agreeable to continue treatment for now to see if he makes any furhther improvement. An MRI is pending per Neurology.  Appreciate Palliative Care's assistance.  [name], MD, MPH, FACS Please use [email or web address] to contact on call provider

## 2019-07-27 NOTE — Progress Notes (Signed)
  NEUROSURGERY PROGRESS NOTE   No issues overnight.   EXAM:  BP 137/86   Pulse 66   Temp 98.4 F (36.9 C) (Axillary)   Resp (!) 24   Ht 5\' 6"  (1.676 m)   Wt 96.7 kg   SpO2 99%   BMI 34.41 kg/m   Intubated precedex running, fentanyl cut off 10 minutes prior to examination Does not open eyes to voice Pupils 69mm, reactive Does not follow commands, although nursing reports he will intermittently Purposeful with BUE  IMPRESSION/PLAN 59 y.o. male s/pfall with severe TBI, bilateral temporo-parietal periventricular infarctions.No further seizure activity. Intermittently following commands per nursing. -continue AED per neurology, MRI - continue supportive care - no new NS recs

## 2019-07-27 NOTE — Progress Notes (Signed)
Patient ID: Eddie Torres, male   DOB: [birth year], 60 y.o.   MRN: [id number]  This NP visited patient at the bedside as a follow up for palliative medicine needs and emotional support for family.   Met as planned with two daughter for continue conversation regarding current medical situation.  Mr Papesh remains intubated with no real new neurologic Improvement .  Today he is unable to follow commands off sedation.   Daughters are questioning MRI that was to be done yesterday.  Coordinated update with Dr Grandville Silos.  He spoke in conference room with daughter to update, answering their questions and concerns.  MRI for today  [name] is struggling with decisions, "how do I know what to do?", :can you tell me 100% if he will or will not get better"  Emotional support and education offered.  We discussed several possible outcomes.  None come with definitiveness.  Both daughter do know however that their father would never want to live on machines,  or have a feeding tube, or live in a nursing home.  This will help guide their decisions.  Decision is to continue with current medical interventions thru the week-end/hoping for improvment, obtain results form the MRI and talk more on Monday.  Meeting scheduled for 10:30  am  Questions and concerns addressed   Discussed with Dr Grandville Silos, he agrees with this plan  Total time spent on the unit was 45 minutes  Greater than 50% of the time was spent in counseling and coordination of care  Wadie Lessen NP  Palliative Medicine Team Team Phone # [phone] Pager [phone]

## 2019-07-27 NOTE — Progress Notes (Signed)
Subjective: No acute events overnight.  No further clinical seizure-like activity.  Patient's daughter at bedside.  ROS: Unable to obtain due to poor mental status  Examination  Vital signs in last 24 hours: Temp:  [98.4 F (36.9 C)-99.1 F (37.3 C)] 98.4 F (36.9 C) (02/04 0400) Pulse Rate:  [65-84] 77 (02/04 1110) Resp:  [12-25] 24 (02/04 1110) BP: (118-180)/(75-101) 180/91 (02/04 1110) SpO2:  [98 %-100 %] 99 % (02/04 1111) Arterial Line BP: (127-175)/(65-91) 163/79 (02/04 0800) FiO2 (%):  [30 %] 30 % (02/04 1111) Weight:  [96.7 kg] 96.7 kg (02/04 0300)  General: lying in bed, not in apparent distress  CVS: pulse-normal rate and rhythm RS: breathing comfortably, intubated Extremities: normal, warm  Neuro: On fentanyl and Precedex MS: Opens eyes to noxious stimuli, does not follow commands  CN: pupils equal and reactive, corneal reflex intact, gag reflex intact, rest of the cranial nerves difficult to assess secondary to intubation Motor: Withdraws to noxious stimuli in all four extremities ( R>L) Reflexes: Upgoing toes bilaterally coordination: normal  Basic Metabolic Panel: Recent Labs  Lab 07/24/19 0350 07/24/19 0350 07/24/19 1300 07/24/19 1300 07/25/19 0949 07/25/19 1625 07/26/19 0637 07/26/19 1806 07/27/19 0620  NA 136  --  135  --  137  --  140  --  146*  K 4.1  --  4.5  --  3.5  --  3.4*  --  3.2*  CL 105  --  105  --  105  --  110  --  111  CO2 21*  --  19*  --  20*  --  22  --  26  GLUCOSE 109*  --  153*  --  170*  --  156*  --  149*  BUN 12  --  11  --  8  --  13  --  18  CREATININE 0.87  --  0.82  --  0.78  --  0.75  --  0.85  CALCIUM 7.8*   < > 7.7*   < > 7.9*  --  8.3*  --  8.7*  MG 2.3  --   --   --  2.0 2.1 2.2 2.0  --   PHOS 2.0*  --   --   --  3.1 3.0 2.5 3.4  --    < > = values in this interval not displayed.    CBC: Recent Labs  Lab 07/23/19 0239 07/24/19 0350 07/25/19 0949 07/26/19 0637 07/27/19 0620  WBC 11.6* 9.9 12.2* 8.8 8.9    HGB 10.0* 9.7* 9.9* 10.1* 9.8*  HCT 30.2* 29.3* 30.0* 29.8* 29.8*  MCV 103.8* 103.2* 103.4* 102.8* 102.8*  PLT 93* 79* 139* 128* 161     Coagulation Studies: No results for input(s): LABPROT, INR in the last 72 hours.  Imaging CT head 07/23/2019:No evidence of much additional intracranial bleeding. Fairly stable patterns of extensive hemorrhagic contusions, subarachnoid blood and subdural blood. There may be slightly more intraparenchymal bleedingin the right frontal region. No evidence of herniation or midline shift.  Newly seen focal low densities at the temporoparietal junctions bilaterally, favored to represent infarctions subsequent to vasospasm.        ASSESSMENT AND PLAN:59 yo malewith unknown PMH who presented to [facility] as a trauma after a fall down 10 steps.Extensive subdural hemorrhages bilaterally and by the falx. Also noted traumatic subarachnoid bleed. Examon 07/23/2019 AMwas different than prior exams and his level of consciousness was lower for which imaging was repeated which is stable.  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  Focal non convulsive status epilepticus(resolved) Bilateral traumatic subdural hemorrhage Sub Arachnoid hemorrhage, traumatic BL tempora-parietal acute infarcts Acute encephalopathy, multifactorial  Thrombocytopenia(stable) Fever ( resolved) Acute respiratory failure Hyperglycemia Anemia - Status epilepticus secondary to underlying SAH, SDH.  - Acute encephalopathy secondary to trauma leading to SDH and SAH, toxic- metabolic (seizures and sedation from treating meds)  Recommendations -We'll check Dilantin level today with goal to slowly wean off at least one AED to minimize sedation. - continueAEDs as follows 1.Keppra 1500 mg twice daily 2.Dilantin 100 mg every 8 hours 3.Vimpat 100 mg twice  daily 4.Clonazepam 2 mg every 8 hours - If neededcanincrease Vimpat to 150mg  BID -MRI brain without contrast for better evaluation of acute intracranial abnormalities pending -Discussed with patient's daughter at bedside that even though he has bilateral brain injury, it is predominantly in the right frontotemporal region which is the nondominant side.  Therefore, I am hopeful that he will follow commands.  We also discussed that by next week/once patient is off sedation if he continues to be unable to follow commands then whether it would be worth pursuing trach/ PEG or not.  Patient's daughter states that her [birth year] on Monday 07/31/2020 so hopefully she'll be and her sister will be able to make a decision by then. - continue seizure precautions - PRN IV versed 5mg  for clinical seizure activity - management of other comorbidities by primary team   CRITICAL CARE Performed by: 09/28/2019   Total critical care time:26minutes  Critical care time was exclusive of separately billable procedures and treating other patients.  Critical care was necessary to treat or prevent imminent or life-threatening deterioration.  Critical care was time spent personally by me on the following activities: development of treatment plan with patient and/or surrogate as well as nursing, discussions with consultants, evaluation of patient's response to treatment, examination of patient, obtaining history from patient or surrogate, ordering and performing treatments and interventions, ordering and review of laboratory studies, ordering and review of radiographic studies, pulse oximetry and re-evaluation of patient's condition.

## 2019-07-27 NOTE — Progress Notes (Signed)
Nutrition Follow-up  DOCUMENTATION CODES:   Obesity unspecified  INTERVENTION:   D/C Vital High Protein  Pivot 1.5 @ 40 ml/hr (960 ml/day) via OG tube 30 ml Prostat TID  Provides: 1740 kcal, 135 grams protein, and 728 ml free water.    NUTRITION DIAGNOSIS:   Inadequate oral intake related to inability to eat as evidenced by NPO status. Ongoing.   GOAL:   Provide needs based on ASPEN/SCCM guidelines Progressing.   MONITOR:   Vent status, TF tolerance, Skin, Weight trends, I & O's, Labs  REASON FOR ASSESSMENT:   Consult Enteral/tube feeding initiation and management  ASSESSMENT:   Pt with a PMH significant for EtOH abuse, HTN, bipolar disorder admitted s/p fall with TBI and found to have L skull fractures, L temporal bone fractures, possible T1 fracture, rib fractures, pulmonary contusions, bilateral SDHs, and bilateral temporo-parietal periventricular infarctions  Palliative care meeting with family. Per trauma continue treatment for now and consider trach/PEG next week based on improvement.   Patient is currently intubated on ventilator support MV: 12.4 L/min Temp (24hrs), Avg:98.6 F (37 C), Min:98.4 F (36.9 C), Max:99.1 F (37.3 C)  Medications reviewed and include: colace, folic acid, MVI, 40 mEq KCl daily, thiamine  30 ml free water every 4 hours = 180 ml  Labs reviewed: Na 146 (H), K+ 3.2 (L), magnesium and phosphorus WNL  CBG's: 152-138-138 UOP: 6700 ml  I&): +6 L   TF: Vital High Protein @ 40 ml/hr with 30 ml Prostat TID Provides: 1260 kcal and 129 grams protein  Diet Order:   Diet Order            Diet NPO time specified  Diet effective now              EDUCATION NEEDS:   Not appropriate for education at this time  Skin:  Skin Assessment: Reviewed RN Assessment  Last BM:  unknown  Height:   Ht Readings from Last 1 Encounters:  July 23, 2019 5\' 6"  (1.676 m)    Weight:   Wt Readings from Last 1 Encounters:  07/27/19 96.7 kg     Ideal Body Weight:  64.5 kg  BMI:  Body mass index is 34.41 kg/m.  Estimated Nutritional Needs:   Kcal:  1600-1900  Protein:  >129g/d  Fluid:  1.9-2.2L/day  09/24/19, RD, LDN, CNSC See AMiON for contact information

## 2019-07-27 NOTE — Progress Notes (Signed)
Patient ID: Eddie Torres, male   DOB: May 02, 1961, 59 y.o.   MRN: [id number] Follow up - Trauma Critical Care  Patient Details:    Eddie Torres is an 59 y.o. male.  Lines/tubes : Airway 7.5 mm (Active)  Secured at (cm) 26 cm 07/27/19 0353  Measured From Lips 07/27/19 [place] 07/27/19 0353  Secured By [employer] 07/27/19 0353  Tube Holder Repositioned Yes 07/27/19 0353  Cuff Pressure (cm H2O) 30 cm H2O 07/26/19 2103  Site Condition Cool;Dry 07/27/19 0353     NG/OG Tube Orogastric Center mouth Aucultation (Active)  External Length of Tube (cm) - (if applicable) 56 cm 78/29/56 2000  Site Assessment Clean;Intact 07/27/19 0800  Ongoing Placement Verification No change in cm markings or external length of tube from initial placement;No change in respiratory status;No acute changes, not attributed to clinical condition 07/27/19 0800  Status Infusing tube feed 07/27/19 0800  Amount of suction 110 mmHg 07/26/19 1600  Drainage Appearance Bile 07/26/19 1600  Output (mL) 200 mL 07/23/19 0600     Urethral Catheter 16 Fr. (Active)  Indication for Insertion or Continuance of Catheter Acute urinary retention (I&O Cath for 24 hrs prior to catheter insertion- Inpatient Only) 07/27/19 0800  Site Assessment Clean;Intact 07/27/19 0800  Catheter Maintenance Bag below level of bladder;Catheter secured;Drainage bag/tubing not touching floor;Insertion date on drainage bag;No dependent loops;Seal intact 07/27/19 0800  Collection Container Standard drainage bag 07/27/19 0800  Securement Method Securing device (Describe) 07/27/19 0800  Urinary Catheter Interventions (if applicable) Unclamped 21/30/86 0800  Output (mL) 800 mL 07/27/19 0600    Microbiology/Sepsis markers: Results for orders placed or performed during the hospital encounter of 2019/07/29  Respiratory Panel by RT PCR (Flu A&B, Covid) - Nasopharyngeal Swab     Status: None   Collection Time: 07-29-19   4:14 PM   Specimen: Nasopharyngeal Swab  Result Value Ref Range Status   SARS Coronavirus 2 by RT PCR NEGATIVE NEGATIVE Final    Comment: (NOTE) SARS-CoV-2 target nucleic acids are NOT DETECTED. The SARS-CoV-2 RNA is generally detectable in upper respiratoy specimens during the acute phase of infection. The lowest concentration of SARS-CoV-2 viral copies this assay can detect is 131 copies/mL. A negative result does not preclude SARS-Cov-2 infection and should not be used as the sole basis for treatment or other patient management decisions. A negative result may occur with  improper specimen collection/handling, submission of specimen other than nasopharyngeal swab, presence of viral mutation(s) within the areas targeted by this assay, and inadequate number of viral copies (<131 copies/mL). A negative result must be combined with clinical observations, patient history, and epidemiological information. The expected result is Negative. Fact Sheet for Patients:  PinkCheek.be Fact Sheet for Healthcare Providers:  GravelBags.it This test is not yet ap proved or cleared by the [place] FDA and  has been authorized for detection and/or diagnosis of SARS-CoV-2 by FDA under an Emergency Use Authorization (EUA). This EUA will remain  in effect (meaning this test can be used) for the duration of the COVID-19 declaration under Section 564(b)(1) of the Act, 21 U.S.C. section 360bbb-3(b)(1), unless the authorization is terminated or revoked sooner.    Influenza A by PCR NEGATIVE NEGATIVE Final   Influenza B by PCR NEGATIVE NEGATIVE Final    Comment: (NOTE) The Xpert Xpress SARS-CoV-2/FLU/RSV assay is intended as an aid in  the diagnosis of influenza from Nasopharyngeal swab specimens and  should not be used as a sole basis  for treatment. Nasal washings and  aspirates are unacceptable for Xpert Xpress SARS-CoV-2/FLU/RSV   testing. Fact Sheet for Patients: https://www.moore.com/ Fact Sheet for Healthcare Providers: https://www.young.biz/ This test is not yet approved or cleared by the [place] FDA and  has been authorized for detection and/or diagnosis of SARS-CoV-2 by  FDA under an Emergency Use Authorization (EUA). This EUA will remain  in effect (meaning this test can be used) for the duration of the  Covid-19 declaration under Section 564(b)(1) of the Act, 21  U.S.C. section 360bbb-3(b)(1), unless the authorization is  terminated or revoked. Performed at [facility], [address], [place], [place] [address]   MRSA PCR Screening     Status: None   Collection Time: 28-Jul-2019  6:29 PM   Specimen: Nasopharyngeal  Result Value Ref Range Status   MRSA by PCR NEGATIVE NEGATIVE Final    Comment:        The GeneXpert MRSA Assay (FDA approved for NASAL specimens only), is one component of a comprehensive MRSA colonization surveillance program. It is not intended to diagnose MRSA infection nor to guide or monitor treatment for MRSA infections. Performed at [facility], [address] [place], [place] [address]   Culture, blood (routine x 2)     Status: None (Preliminary result)   Collection Time: 07/23/19 11:03 PM   Specimen: BLOOD  Result Value Ref Range Status   Specimen Description BLOOD LEFT ANTECUBITAL  Final   Special Requests   Final    BOTTLES DRAWN AEROBIC ONLY Blood Culture adequate volume   Culture   Final    NO GROWTH 3 DAYS Performed at [facility], [address] [place], [place] [address]    Report Status PENDING  Incomplete  Culture, blood (routine x 2)     Status: None (Preliminary result)   Collection Time: 07/23/19 11:08 PM   Specimen: BLOOD  Result Value Ref Range Status   Specimen Description BLOOD LEFT ANTECUBITAL  Final   Special Requests   Final    BOTTLES DRAWN AEROBIC ONLY Blood Culture adequate  volume   Culture   Final    NO GROWTH 3 DAYS Performed at [facility], [address] [place], [place] [address]    Report Status PENDING  Incomplete  Culture, Urine     Status: None   Collection Time: 07/24/19 10:45 AM   Specimen: Urine, Clean Catch  Result Value Ref Range Status   Specimen Description URINE, CLEAN CATCH  Final   Special Requests NONE  Final   Culture   Final    NO GROWTH Performed at [facility], [address], [place], [place] [address]    Report Status 07/25/2019 FINAL  Final  Culture, respiratory (non-expectorated)     Status: None   Collection Time: 07/25/19 11:24 AM   Specimen: Tracheal Aspirate; Respiratory  Result Value Ref Range Status   Specimen Description TRACHEAL ASPIRATE  Final   Special Requests NONE  Final   Gram Stain   Final    RARE WBC PRESENT, PREDOMINANTLY PMN RARE GRAM POSITIVE RODS    Culture   Final    FEW Consistent with normal respiratory flora. Performed at [facility], [address] [place], [place] [address]    Report Status 07/27/2019 FINAL  Final    Anti-infectives:  Anti-infectives (From admission, onward)   Start     Dose/Rate Route Frequency Ordered Stop   07/24/19 1000  vancomycin (VANCOCIN) IVPB 1000 mg/200 mL premix  Status:  Discontinued     1,000 mg 200 mL/hr over 60 Minutes Intravenous Every 12 hours 07/23/19 2309 07/26/19 1019   07/23/19 2300  vancomycin (VANCOREADY) IVPB 1500 mg/300 mL     1,500 mg 150 mL/hr over 120 Minutes Intravenous  Once 07/23/19 2245 07/24/19 0445   07/23/19 2300  piperacillin-tazobactam (ZOSYN) IVPB 3.375 g  Status:  Discontinued     3.375 g 12.5 mL/hr over 240 Minutes Intravenous Every 8 hours 07/23/19 2245 07/26/19 1019   08-10-19 1645  ceFAZolin (ANCEF) IVPB 2g/100 mL premix     2 g 200 mL/hr over 30 Minutes Intravenous  Once 08-10-2019 1630 10-Aug-2019 1717      Best Practice/Protocols:  VTE Prophylaxis: Lovenox (prophylaxtic dose) Continous  Sedation  Consults: Treatment Team:  Lisbeth Renshaw, MD   Subjective:    Overnight Issues:   Objective:  Vital signs for last 24 hours: Temp:  [98 F (36.7 C)-99.1 F (37.3 C)] 98.4 F (36.9 C) (02/04 0400) Pulse Rate:  [65-92] 67 (02/04 0800) Resp:  [12-24] 24 (02/04 0800) BP: (118-163)/(75-101) 148/89 (02/04 0800) SpO2:  [97 %-100 %] 100 % (02/04 0800) Arterial Line BP: (127-175)/(65-91) 163/79 (02/04 0800) FiO2 (%):  [30 %] 30 % (02/04 0700) Weight:  [96.7 kg] 96.7 kg (02/04 0300)  Hemodynamic parameters for last 24 hours:    Intake/Output from previous day: 02/03 0701 - 02/04 0700 In: 2179.4 [I.V.:839.4; NG/GT:1020; IV Piggyback:319.9] Out: 6700 [Urine:6700]  Intake/Output this shift: Total I/O In: 34.5 [I.V.:34.5] Out: -   Vent settings for last 24 hours: Vent Mode: PRVC FiO2 (%):  [30 %] 30 % Set Rate:  [24 bmp] 24 bmp Vt Set:  [510 mL] 510 mL PEEP:  [5 cmH20] 5 cmH20 Pressure Support:  [12 cmH20] 12 cmH20 Plateau Pressure:  [20 cmH20-23 cmH20] 20 cmH20  Physical Exam:  General: no distress Neuro: arouses, not clearly F/C HEENT/Neck: ETT and collar Resp: clear to auscultation bilaterally CVS: RRR GI: soft, NT Extremities: edema 1+  Results for orders placed or performed during the hospital encounter of 2019/08/10 (from the past 24 hour(s))  Glucose, capillary     Status: Abnormal   Collection Time: 07/26/19 11:49 AM  Result Value Ref Range   Glucose-Capillary 162 (H) 70 - 99 mg/dL   Comment 1 Notify RN    Comment 2 Document in Chart   Glucose, capillary     Status: Abnormal   Collection Time: 07/26/19  3:53 PM  Result Value Ref Range   Glucose-Capillary 151 (H) 70 - 99 mg/dL   Comment 1 Notify RN    Comment 2 Document in Chart   Magnesium     Status: None   Collection Time: 07/26/19  6:06 PM  Result Value Ref Range   Magnesium 2.0 1.7 - 2.4 mg/dL  Phosphorus     Status: None   Collection Time: 07/26/19  6:06 PM  Result Value Ref Range    Phosphorus 3.4 2.5 - 4.6 mg/dL  Glucose, capillary     Status: Abnormal   Collection Time: 07/26/19  7:40 PM  Result Value Ref Range   Glucose-Capillary 149 (H) 70 - 99 mg/dL  Glucose, capillary     Status: Abnormal   Collection Time: 07/26/19 11:26 PM  Result Value Ref Range   Glucose-Capillary 134 (H) 70 - 99 mg/dL  Glucose, capillary     Status: Abnormal   Collection Time: 07/27/19  3:36 AM  Result Value Ref  Range   Glucose-Capillary 152 (H) 70 - 99 mg/dL  CBC     Status: Abnormal   Collection Time: 07/27/19  6:20 AM  Result Value Ref Range   WBC 8.9 4.0 - 10.5 K/uL   RBC 2.90 (L) 4.22 - 5.81 MIL/uL   Hemoglobin 9.8 (L) 13.0 - 17.0 g/dL   HCT 35.5 (L) 73.2 - 20.2 %   MCV 102.8 (H) 80.0 - 100.0 fL   MCH 33.8 26.0 - 34.0 pg   MCHC 32.9 30.0 - 36.0 g/dL   RDW 54.2 70.6 - 23.7 %   Platelets 161 150 - 400 K/uL   nRBC 0.0 0.0 - 0.2 %  Basic metabolic panel     Status: Abnormal   Collection Time: 07/27/19  6:20 AM  Result Value Ref Range   Sodium 146 (H) 135 - 145 mmol/L   Potassium 3.2 (L) 3.5 - 5.1 mmol/L   Chloride 111 98 - 111 mmol/L   CO2 26 22 - 32 mmol/L   Glucose, Bld 149 (H) 70 - 99 mg/dL   BUN 18 6 - 20 mg/dL   Creatinine, Ser 6.28 0.61 - 1.24 mg/dL   Calcium 8.7 (L) 8.9 - 10.3 mg/dL   GFR calc non Af Amer >60 >60 mL/min   GFR calc Af Amer >60 >60 mL/min   Anion gap 9 5 - 15  Glucose, capillary     Status: Abnormal   Collection Time: 07/27/19  8:03 AM  Result Value Ref Range   Glucose-Capillary 138 (H) 70 - 99 mg/dL    Assessment & Plan: Present on Admission: . ICH (intracerebral hemorrhage) (HCC)    LOS: 6 days   Additional comments:I reviewed the patient's new clinical lab test results. , Fall down 10 stairs  B/l SDH/SAH, Large R frontal contusion, tentorial and falcine SDH-NSGY on board (Dr. Conchita Paris), CT 1/31 with slightly worsened R frontal IPH and new bi-temporoparietal infarctions 2/2 vasospasm. MRI ordered by Neurology L occipital skull fx,  L temporal bone fx with blood in L middle ear- ENT on board (Dr Annalee Genta), recs for ciprodex OTIC BID for the duration of hospitalization. Will need audiogram. Seizures - on cEEG. Status epilepticus 1/31, now out of status, continuous EEG, Keppra/dilantin/Vimpat. Versed off, MRI ordered Acute hypoxic ventilator dependent respiratory failure- weaned on 12/5 yesterday - will try again once sedation reduced Pneumocephalus  Scalp laceration- stapled in ED Possible T1 fracture- maintain c-collar for now per NSGY, most likely degenerative.  L 10th rib fracture, old rib fractures L 9-10- pain control HTN, bipolar disorder EtOH abuse- CIWA, 354 on admit, Precedex Thrombocytopenia - resolved FEN - on TF. Continue klon/sero VTE- PAS, resume Lovenox with PLTs up ID - off ABX Dispo- ICU I spoke with hte Palliative team today. Appreciate their help. They are meeting with his daughters again today at 40. Critical Care Total Time*: 39 Minutes  Violeta Gelinas, MD, MPH, FACS Trauma & General Surgery Use [email or web address] to contact on call provider  07/27/2019  *Care during the described time interval was provided by me. I have reviewed this patient's available data, including medical history, events of note, physical examination and test results as part of my evaluation.

## 2019-07-28 DIAGNOSIS — Z7189 Other specified counseling: Secondary | ICD-10-CM

## 2019-07-28 DIAGNOSIS — I639 Cerebral infarction, unspecified: Secondary | ICD-10-CM

## 2019-07-28 DIAGNOSIS — W19XXXA Unspecified fall, initial encounter: Secondary | ICD-10-CM

## 2019-07-28 LAB — CULTURE, BLOOD (ROUTINE X 2)
Culture: NO GROWTH
Culture: NO GROWTH
Special Requests: ADEQUATE
Special Requests: ADEQUATE

## 2019-07-28 LAB — GLUCOSE, CAPILLARY
Glucose-Capillary: 160 mg/dL — ABNORMAL HIGH (ref 70–99)
Glucose-Capillary: 144 mg/dL — ABNORMAL HIGH (ref 70–99)
Glucose-Capillary: 161 mg/dL — ABNORMAL HIGH (ref 70–99)
Glucose-Capillary: 155 mg/dL — ABNORMAL HIGH (ref 70–99)
Glucose-Capillary: 150 mg/dL — ABNORMAL HIGH (ref 70–99)
Glucose-Capillary: 141 mg/dL — ABNORMAL HIGH (ref 70–99)

## 2019-07-28 LAB — CBC
HCT: 30.8 % — ABNORMAL LOW (ref 39.0–52.0)
Hemoglobin: 9.9 g/dL — ABNORMAL LOW (ref 13.0–17.0)
MCH: 33.8 pg (ref 26.0–34.0)
MCHC: 32.1 g/dL (ref 30.0–36.0)
MCV: 105.1 fL — ABNORMAL HIGH (ref 80.0–100.0)
Platelets: 177 10*3/uL (ref 150–400)
RBC: 2.93 MIL/uL — ABNORMAL LOW (ref 4.22–5.81)
RDW: 13.2 % (ref 11.5–15.5)
WBC: 10 10*3/uL (ref 4.0–10.5)
nRBC: 0.2 % (ref 0.0–0.2)

## 2019-07-28 LAB — BASIC METABOLIC PANEL
Anion gap: 12 (ref 5–15)
BUN: 25 mg/dL — ABNORMAL HIGH (ref 6–20)
CO2: 25 mmol/L (ref 22–32)
Calcium: 8.8 mg/dL — ABNORMAL LOW (ref 8.9–10.3)
Chloride: 113 mmol/L — ABNORMAL HIGH (ref 98–111)
Creatinine, Ser: 0.84 mg/dL (ref 0.61–1.24)
GFR calc Af Amer: >60 mL/min (ref >60)
GFR calc non Af Amer: >60 mL/min (ref >60)
Glucose, Bld: 156 mg/dL — ABNORMAL HIGH (ref 70–99)
Potassium: 3.8 mmol/L (ref 3.5–5.1)
Sodium: 150 mmol/L — ABNORMAL HIGH (ref 135–145)

## 2019-07-28 LAB — PHENYTOIN LEVEL, TOTAL: Phenytoin Lvl: <2.5 ug/mL — ABNORMAL LOW (ref 10.0–20.0)

## 2019-07-28 MED ORDER — HYDRALAZINE HCL 20 MG/ML IJ SOLN
5.0000 mg | INTRAMUSCULAR | Status: DC
  Administered 2019-07-28 – 2019-07-29 (×6): 20 mg via INTRAVENOUS
  Filled 2019-07-28 (×6): qty 1

## 2019-07-28 MED ORDER — HYDRALAZINE HCL 20 MG/ML IJ SOLN
20.0000 mg | Freq: Once | INTRAMUSCULAR | Status: AC
  Administered 2019-07-28: 20 mg via INTRAVENOUS
  Filled 2019-07-28: qty 1

## 2019-07-28 MED ORDER — METOPROLOL TARTRATE 25 MG/10 ML ORAL SUSPENSION
50.0000 mg | Freq: Two times a day (BID) | ORAL | Status: DC
  Administered 2019-07-28 – 2019-07-29 (×3): 50 mg
  Filled 2019-07-28 (×3): qty 20

## 2019-07-28 NOTE — Progress Notes (Signed)
  NEUROSURGERY PROGRESS NOTE   No issues overnight.   EXAM:  BP (!) 147/76   Pulse 65   Temp 98.3 F (36.8 C) (Oral)   Resp (!) 24   Ht 5\' 6"  (1.676 m)   Wt 96.7 kg   SpO2 100%   BMI 34.41 kg/m   Intubated On propofol and fentanyl  Pupils 14mm reactive Per nursing, when sedation reduced will open eyes to voice. Purposeful with BUE. Not following commands  IMPRESSION/PLAN 59 y.o. male s/pfall withsevereTBI, multiple infarcts on MRI.No further seizure activity. - continue supportive care, AEDs per Neuro - no new NS recs  - Family to decide about trach/PEG pending improvement

## 2019-07-28 NOTE — Progress Notes (Signed)
Trauma/Critical Care Follow Up Note  Subjective:    Overnight Issues: NAEON, versed weaned off yesterday. Now off cEEG  Objective:  Vital signs for last 24 hours: Temp:  [98.2 F (36.8 C)-99.5 F (37.5 C)] 98.3 F (36.8 C) (02/05 0400) Pulse Rate:  [64-90] 65 (02/05 0700) Resp:  [19-25] 24 (02/05 0700) BP: (141-199)/(67-97) 147/76 (02/05 0700) SpO2:  [97 %-100 %] 100 % (02/05 0743) Arterial Line BP: (123-196)/(62-95) 132/74 (02/04 2100) FiO2 (%):  [30 %] 30 % (02/05 0743)  Hemodynamic parameters for last 24 hours:    Intake/Output from previous day: 02/04 0701 - 02/05 0700 In: 1288.9 [I.V.:866.3; NG/GT:152.7; IV Piggyback:269.9] Out: 2300 [Urine:2300]  Intake/Output this shift: No intake/output data recorded.  Vent settings for last 24 hours: Vent Mode: PRVC FiO2 (%):  [30 %] 30 % Set Rate:  [24 bmp] 24 bmp Vt Set:  [510 mL] 510 mL PEEP:  [5 cmH20] 5 cmH20 Plateau Pressure:  [19 cmH20-24 cmH20] 24 cmH20  Physical Exam:  Gen:no distress Neuro: not following commands, no response to noxious stimulus for me HEENT: PERRL Neck: supple CV: RRR Pulm: unlabored breathing on MV Abd: soft, NT GU: clear yellow urine Extr: wwp, trace edema   Results for orders placed or performed during the hospital encounter of 07/18/2019 (from the past 24 hour(s))  Glucose, capillary     Status: Abnormal   Collection Time: 07/27/19 11:32 AM  Result Value Ref Range   Glucose-Capillary 138 (H) 70 - 99 mg/dL  Glucose, capillary     Status: Abnormal   Collection Time: 07/27/19  3:13 PM  Result Value Ref Range   Glucose-Capillary 149 (H) 70 - 99 mg/dL  Glucose, capillary     Status: Abnormal   Collection Time: 07/27/19  7:26 PM  Result Value Ref Range   Glucose-Capillary 120 (H) 70 - 99 mg/dL  Glucose, capillary     Status: Abnormal   Collection Time: 07/27/19 11:13 PM  Result Value Ref Range   Glucose-Capillary 158 (H) 70 - 99 mg/dL  Glucose, capillary     Status: Abnormal   Collection Time: 07/28/19  3:33 AM  Result Value Ref Range   Glucose-Capillary 160 (H) 70 - 99 mg/dL  CBC     Status: Abnormal   Collection Time: 07/28/19  5:21 AM  Result Value Ref Range   WBC 10.0 4.0 - 10.5 K/uL   RBC 2.93 (L) 4.22 - 5.81 MIL/uL   Hemoglobin 9.9 (L) 13.0 - 17.0 g/dL   HCT 30.8 (L) 39.0 - 52.0 %   MCV 105.1 (H) 80.0 - 100.0 fL   MCH 33.8 26.0 - 34.0 pg   MCHC 32.1 30.0 - 36.0 g/dL   RDW 13.2 11.5 - 15.5 %   Platelets 177 150 - 400 K/uL   nRBC 0.2 0.0 - 0.2 %  Basic metabolic panel     Status: Abnormal   Collection Time: 07/28/19  5:21 AM  Result Value Ref Range   Sodium 150 (H) 135 - 145 mmol/L   Potassium 3.8 3.5 - 5.1 mmol/L   Chloride 113 (H) 98 - 111 mmol/L   CO2 25 22 - 32 mmol/L   Glucose, Bld 156 (H) 70 - 99 mg/dL   BUN 25 (H) 6 - 20 mg/dL   Creatinine, Ser 0.84 0.61 - 1.24 mg/dL   Calcium 8.8 (L) 8.9 - 10.3 mg/dL   GFR calc non Af Amer >60 >60 mL/min   GFR calc Af Amer >60 >60 mL/min  Anion gap 12 5 - 15  Glucose, capillary     Status: Abnormal   Collection Time: 07/28/19  9:00 AM  Result Value Ref Range   Glucose-Capillary 144 (H) 70 - 99 mg/dL    Assessment & Plan: Present on Admission: . ICH (intracerebral hemorrhage) (HCC)    LOS: 7 days   Additional comments:I reviewed the patient's new clinical lab test results.   and I reviewed the patients new imaging test results.    Fall down 10 stairs  B/l SDH/SAH, Large R frontal contusion, tentorial and falcine SDH-NSGY on board (Dr. Conchita Paris), CT 1/31 with slightly worsened R frontal IPH and new bi-temporoparietal infarctions 2/2 vasospasm. Plan for MRI brain today L occipital skull fx, L temporal bone fx with blood in L middle ear- ENT on board (Dr Annalee Genta), recs for ciprodex OTIC BID for the duration of hospitalization. Will need audiogram. Seizures - off cEEG. Status epilepticus 1/31, now out of status, and sz free >24h on AEDs x3. Off versed gtt.  Acute hypoxic ventilator dependent  respiratory failure- PSV trials, but requiring significant pressure support Aspirationon induction - monitor pulmonary status Pneumocephalus - monitor Scalp laceration- stapled in ED Possible T1 fracture- maintain c-collar for now per NSGY, most likely degenerative.  L 10th rib fracture, old rib fractures L 9-10- pain control HTN, bipolar disorder EtOH abuse- CIWA, precedex, 354 on admit Thrombocytopenia - new since 1/31, worse today. Medication review only reveals protonix as possible etiology (AEDs and abx started after initial drop). Will recheck in AM and if continuing to drop, switch to pepcid (still has same risk profile, but different class of med). Possibly due to hepatic dysfunction/EtOH abuse, but LFTs are not significantly abnormal and platelet count was normal on presentation.  FEN - continue TF. Continue klon/sero. Replete K and phos.  VTE- PAS, start LMWH ID - d/c empiric vanc/zosyn Dispo- ICU  Discussion held with both daughters, one at bedside and one via video chat. Explained current clinical state and plan to give him additional time to wake up. Discussed option of tracheostomy, but explained that he is too early in his course for consideration of this. Plan for second conversation with palliative 2/4.     Critical Care Total Time: 35 minutes  Diamantina Monks, MD Trauma & General Surgery Please use [email or web address] to contact on call provider  07/26/2019  *Care during the described time interval was provided by me. I have reviewed this patient's available data, including medical history, events of note, physical examination and test results as part of my evaluation.

## 2019-07-28 NOTE — Progress Notes (Signed)
Patient ID: Eddie Torres, male   DOB: 14-Jul-1960, 59 y.o.   MRN: [id number] I met with his daughter, Eddie Torres at the bedside and she face-timed his other daughter, Eddie Torres. I discussed the results of the MRI which shows more extensive injuries. We again discussed goals of care. They want to give him a couple days to see if he improves at all. Then there is another Palliative meeting Monday. I advised Eddie Torres, who has not been able to visit, that we can make an exception and allow her to come in Monday for the meeting with Palliative Care if transition to comfort will be discussed.  [name], MD, MPH, FACS Please use [email or web address] to contact on call provider

## 2019-07-28 NOTE — Progress Notes (Signed)
Daily Progress Note   Patient Name: Eddie Torres       Date: 07/28/2019 DOB: 22-Mar-1961  Age: 59 y.o. MRN#: [id number] Attending Physician: Particia Jasper, MD Primary Care Physician: Patient, No Pcp Per Admit Date: 07/07/2019  Reason for Consultation/Follow-up: Establishing goals of care  Subjective: Received call from bedside nurse that patient's daughters were at bedside. Met with Barth Kirks and Haze Boyden to further support difficult decision making. Emotional support given. Barth Kirks and Haze Boyden both agreed that if patient were able to speak for himself in this situation, he would not want ongoing aggressive medical care and would prefer to shift focus on comfort and support through natural dying process. They acknowledge he was very independent prior to this accident and he had stated very clearly he would not want to live dependent on others for care or in a facility. They spoke of a family member who had a traumatic brain injury who had not returned to themselves.  Gave support and discussed with them that we do not deny miracles- but given his extensive brain injury he is high risk for being dependent for care and living long term in a facility.  At close of discussion decision was made for DNR status and comfort measures.  Patient's sister is coming from out of town- expected to arrive tomorrow. Plan for compassionate wean off of artificial life supporting measures tomorrow after her arrival.   ROS  Length of Stay: 7  Current Medications: Scheduled Meds:  . bethanechol  25 mg Per Tube TID  . chlorhexidine gluconate (MEDLINE KIT)  15 mL Mouth Rinse BID  . Chlorhexidine Gluconate Cloth  6 each Topical Daily  . ciprofloxacin-dexamethasone  4 drop Left EAR BID  . clonazePAM  2 mg Per Tube Q8H  .  cloNIDine  0.2 mg Transdermal Weekly  . docusate  100 mg Per Tube Daily  . enoxaparin (LOVENOX) injection  40 mg Subcutaneous Q24H  . feeding supplement (PRO-STAT SUGAR FREE 64)  30 mL Per Tube TID  . fentaNYL (SUBLIMAZE) injection  50 mcg Intravenous Once  . folic acid  1 mg Per Tube Daily  . free water  30 mL Per Tube Q4H  . mouth rinse  15 mL Mouth Rinse 10 times per day  . metoprolol tartrate  50 mg Per Tube BID  .  multivitamin  15 mL Per Tube Daily  . pantoprazole sodium  40 mg Per Tube Daily  . phenytoin (DILANTIN) IV  100 mg Intravenous Q8H  . potassium chloride  40 mEq Per Tube Daily  . QUEtiapine  50 mg Per Tube BID  . sodium chloride flush  10-40 mL Intracatheter Q12H    Continuous Infusions: . sodium chloride    . sodium chloride    . dexmedetomidine (PRECEDEX) IV infusion 0.6 mcg/kg/hr (07/28/19 1500)  . feeding supplement (PIVOT 1.5 CAL) 1,000 mL (07/27/19 2025)  . fentaNYL infusion INTRAVENOUS 100 mcg/hr (07/28/19 1500)  . lacosamide (VIMPAT) IV Stopped (07/28/19 1128)  . levETIRAcetam Stopped (07/28/19 0904)  . thiamine injection Stopped (07/28/19 1038)    PRN Meds: Place/Maintain arterial line **AND** sodium chloride, acetaminophen (TYLENOL) oral liquid 160 mg/5 mL, fentaNYL, fentaNYL (SUBLIMAZE) injection, hydrALAZINE, labetalol, ondansetron **OR** ondansetron (ZOFRAN) IV, sodium chloride flush  Physical Exam          Vital Signs: BP (!) 149/69   Pulse 70   Temp 99.8 F (37.7 C) (Axillary)   Resp (!) 24   Ht 5' 6"  (1.676 m)   Wt 96.7 kg   SpO2 99%   BMI 34.41 kg/m  SpO2: SpO2: 99 % O2 Device: O2 Device: Ventilator O2 Flow Rate:    Intake/output summary:   Intake/Output Summary (Last 24 hours) at 07/28/2019 1552 Last data filed at 07/28/2019 1500 Gross per 24 hour  Intake 2024.47 ml  Output 2700 ml  Net -675.53 ml   LBM: Last BM Date: (pta) Baseline Weight: Weight: 100 kg Most recent weight: Weight: 96.7 kg       Palliative Assessment/Data:  PPS: 10%      Patient Active Problem List   Diagnosis Date Noted  . Palliative care by specialist   . Head injury   . DNR (do not resuscitate) discussion   . Respiratory failure ([place])   . ICH (intracerebral hemorrhage) ([place]) 07/17/2019    Palliative Care Assessment & Plan   Patient Profile: 59 y.o. male  admitted on 07/08/2019 who was brought in as a level 1 trauma after fall down estimated 10 stairs. Fall was not witnessed but heard by roommates and they found him down at the bottom of the stairs. EMS reported obvious head trauma and GCS of 3. Patient lived in a "home for elder men". Friends were unable to provide any medical history. On arrival patient with GCS7 [phone]). Intubated for airway protection. Moved bilateral UEs with good strength prior to intubation. Stellate L posterior scalp wound ~4-5 cm at longest point. Per patient's other chart, PMH significant for HTN, EtOH abuse, Bipolar disorder. MRI yesterday shows several stable areas of hemmorhage and new findings of multiple areas of infarct. Palliative medicine consulted for goals of care.   Assessment/Recommendations/Plan   DNR  Plan for compassionate wean and removal of artificial life prolonging measures tomorrow when family arrives  Goals of Care and Additional Recommendations:  Limitations on Scope of Treatment: Full Comfort Care  Code Status:  DNR  Prognosis:   Hours - Days once transition to full comfort is completed  Discharge Planning:  Anticipated Hospital Death  Care plan was discussed with patient's daughters  Thank you for allowing the Palliative Medicine Team to assist in the care of this patient.   Time In: 1400 Time Out: 1530 Total Time 90 minutes Prolonged Time Billed Yes      Greater than 50%  of this time was spent counseling and coordinating  care related to the above assessment and plan.  Mariana Kaufman, AGNP-C Palliative Medicine   Please contact Palliative Medicine Team phone at  [phone] for questions and concerns.

## 2019-07-28 NOTE — Progress Notes (Signed)
Subjective: No further clinical seizures overnight.  No other acute events.  ROS: Unable to obtain due to poor mental status  Examination  Vital signs in last 24 hours: Temp:  [98.2 F (36.8 C)-99.5 F (37.5 C)] 98.3 F (36.8 C) (02/05 0400) Pulse Rate:  [64-98] 65 (02/05 0700) Resp:  [19-25] 24 (02/05 0700) BP: (141-205)/(67-98) 147/76 (02/05 0700) SpO2:  [97 %-100 %] 100 % (02/05 0743) Arterial Line BP: (123-216)/(62-102) 132/74 (02/04 2100) FiO2 (%):  [30 %] 30 % (02/05 0743)  General: lying in bed,not in apparent distress CVS: pulse-normal rate and rhythm RS: breathing comfortably,intubated Extremities: normal,warm  Neuro:On Precedex, fentanyl FT:DDUKG eyes to noxious stimuli, a does not follow commands  CN: pupils equal and reactive,oculocephalic reflex intact, corneal reflex intact, gag reflex intact, unable to assess rest of the cranial nerves due to intubation  Motor:Withdraws to noxious stimuli with antigravity strength i all 4 extremities (R>L) Reflexes: bilateral upgoing toes  Basic Metabolic Panel: Recent Labs  Lab 07/24/19 0350 07/24/19 0350 07/24/19 1300 07/24/19 1300 07/25/19 0949 07/25/19 0949 07/25/19 1625 07/26/19 0637 07/26/19 1806 07/27/19 0620 07/28/19 0521  NA 136   < > 135  --  137  --   --  140  --  146* 150*  K 4.1   < > 4.5  --  3.5  --   --  3.4*  --  3.2* 3.8  CL 105   < > 105  --  105  --   --  110  --  111 113*  CO2 21*   < > 19*  --  20*  --   --  22  --  26 25  GLUCOSE 109*   < > 153*  --  170*  --   --  156*  --  149* 156*  BUN 12   < > 11  --  8  --   --  13  --  18 25*  CREATININE 0.87   < > 0.82  --  0.78  --   --  0.75  --  0.85 0.84  CALCIUM 7.8*   < > 7.7*   < > 7.9*   < >  --  8.3*  --  8.7* 8.8*  MG 2.3  --   --   --  2.0  --  2.1 2.2 2.0  --   --   PHOS 2.0*  --   --   --  3.1  --  3.0 2.5 3.4  --   --    < > = values in this interval not displayed.    CBC: Recent Labs  Lab 07/24/19 0350 07/25/19 0949  07/26/19 0637 07/27/19 0620 07/28/19 0521  WBC 9.9 12.2* 8.8 8.9 10.0  HGB 9.7* 9.9* 10.1* 9.8* 9.9*  HCT 29.3* 30.0* 29.8* 29.8* 30.8*  MCV 103.2* 103.4* 102.8* 102.8* 105.1*  PLT 79* 139* 128* 161 177     Coagulation Studies: No results for input(s): LABPROT, INR in the last 72 hours.  Imaging MRI brain without contrast 07/27/2019: Hemorrhagic contusions right frontal lobe and left temporal lobe stable.   Bilateral subdural hematomas stable. Interhemispheric and left tentorial subdural hematoma unchanged. Subarachnoid hemorrhage.  Multiple areas of acute infarct in both temporal lobes, both parietal lobes, and the right frontal lobe.       ASSESSMENT AND PLAN: 59 yo malewith unknown PMH who presented to [facility] as a trauma after a fall down 10 steps.Extensive subdural hemorrhages bilaterally and by  the falx. Also noted traumatic subarachnoid bleed. 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  - Acute encephalopathy secondary to trauma leading to SDH and SAH, toxic- metabolic (seizures and sedation from treating meds) -Last seizure on 05/22/2020 at 0657  Recommendations - continueAEDs as follows 1.Keppra 1500 mg twice daily 2.Dilantin 100 mg every 8 hours 3.Vimpat 100 mg twice daily 4.Clonazepam 2 mg every 8 hours - If neededcanincrease  Vimpat to 150mg  BID - Will consider weaning off one AED next week if patient remains seizure free -MRI brain without contrast showed previous injuries previously seen on CT head.  However importantly, I did not see any evidence of infarct in left frontotemporal region including Broca's and Wernicke's.  -I had discussed with patient's daughter yesterday that he does have significant brain injury.  Per daughter, she would like to reevaluate on Monday and and make further decisions regarding trach/PEG. - continue seizure precautions - PRN IV versed 5mg  for clinical seizure activity - management of other comorbidities by primary team   CRITICAL CARE Performed by: Lora Havens   Total critical care time:33minutes  Critical care time was exclusive of separately billable procedures and treating other patients.  Critical care was necessary to treat or prevent imminent or life-threatening deterioration.  Critical care was time spent personally by me on the following activities: development of treatment plan with patient and/or surrogate as well as nursing, discussions with consultants, evaluation of patient's response to treatment, examination of patient, obtaining history from patient or surrogate, ordering and performing treatments and interventions, ordering and review of laboratory studies, ordering and review of radiographic studies, pulse oximetry and re-evaluation of patient's condition.

## 2019-07-28 NOTE — Progress Notes (Addendum)
Patient ID: Eddie Torres, male   DOB: [birth year], 59 y.o.   MRN: [id number] Follow up - Trauma Critical Care  Patient Details:    Eddie Torres is an 59 y.o. male.  Lines/tubes : Airway 7.5 mm (Active)  Secured at (cm) 25 cm 07/28/19 0743  Measured From Lips 07/28/19 0743  Secured Location Right 07/28/19 0743  Secured By [employer] 07/28/19 0743  Tube Holder Repositioned Yes 07/28/19 0743  Cuff Pressure (cm H2O) 28 cm H2O 07/27/19 2046  Site Condition Cool;Dry 07/28/19 0743     NG/OG Tube Orogastric Center mouth Aucultation (Active)  External Length of Tube (cm) - (if applicable) 57 cm 07/27/19 2000  Site Assessment Clean;Intact 07/28/19 0435  Ongoing Placement Verification No change in cm markings or external length of tube from initial placement;No change in respiratory status;No acute changes, not attributed to clinical condition 07/28/19 0435  Status Infusing tube feed 07/28/19 0435  Amount of suction 110 mmHg 07/26/19 1600  Drainage Appearance Bile 07/26/19 1600  Output (mL) 200 mL 07/23/19 0600     Urethral Catheter 16 Fr. (Active)  Indication for Insertion or Continuance of Catheter Acute urinary retention (I&O Cath for 24 hrs prior to catheter insertion- Inpatient Only) 07/28/19 0435  Site Assessment Clean;Intact 07/28/19 0435  Catheter Maintenance Bag below level of bladder;Catheter secured;Drainage bag/tubing not touching floor;Insertion date on drainage bag;Bag emptied prior to transport;Seal intact;No dependent loops 07/28/19 0435  Collection Container Standard drainage bag 07/28/19 0435  Securement Method Securing device (Describe) 07/28/19 0435  Urinary Catheter Interventions (if applicable) Unclamped 07/27/19 0800  Output (mL) 700 mL 07/28/19 0600    Microbiology/Sepsis markers: Results for orders placed or performed during the hospital encounter of 07/11/2019  Respiratory Panel by RT PCR (Flu A&B, Covid) - Nasopharyngeal Swab     Status: None   Collection Time: 07/15/2019  4:14 PM   Specimen: Nasopharyngeal Swab  Result Value Ref [place] Status   SARS Coronavirus 2 by RT PCR NEGATIVE NEGATIVE Final    Comment: (NOTE) SARS-CoV-2 target nucleic acids are NOT DETECTED. The SARS-CoV-2 RNA is generally detectable in upper respiratoy specimens during the acute phase of infection. The lowest concentration of SARS-CoV-2 viral copies this assay can detect is 131 copies/mL. A negative result does not preclude SARS-Cov-2 infection and should not be used as the sole basis for treatment or other patient management decisions. A negative result may occur with  improper specimen collection/handling, submission of specimen other than nasopharyngeal swab, presence of viral mutation(s) within the areas targeted by this assay, and inadequate number of viral copies (<131 copies/mL). A negative result must be combined with clinical observations, patient history, and epidemiological information. The expected result is Negative. Fact Sheet for Patients:  https://www.moore.com/ Fact Sheet for Healthcare Providers:  https://www.young.biz/ This test is not yet ap proved or cleared by the [place] FDA and  has been authorized for detection and/or diagnosis of SARS-CoV-2 by FDA under an Emergency Use Authorization (EUA). This EUA will remain  in effect (meaning this test can be used) for the duration of the COVID-19 declaration under Section 564(b)(1) of the Act, 21 U.S.C. section 360bbb-3(b)(1), unless the authorization is terminated or revoked sooner.    Influenza A by PCR NEGATIVE NEGATIVE Final   Influenza B by PCR NEGATIVE NEGATIVE Final    Comment: (NOTE) The Xpert Xpress SARS-CoV-2/FLU/RSV assay is intended as an aid in  the diagnosis of influenza from Nasopharyngeal swab specimens and  should not be used as  a sole basis for treatment. Nasal washings and  aspirates are unacceptable for Xpert Xpress  SARS-CoV-2/FLU/RSV  testing. Fact Sheet for Patients: PinkCheek.be Fact Sheet for Healthcare Providers: GravelBags.it This test is not yet approved or cleared by the [place] FDA and  has been authorized for detection and/or diagnosis of SARS-CoV-2 by  FDA under an Emergency Use Authorization (EUA). This EUA will remain  in effect (meaning this test can be used) for the duration of the  Covid-19 declaration under Section 564(b)(1) of the Act, 21  U.S.C. section 360bbb-3(b)(1), unless the authorization is  terminated or revoked. Performed at [facility], [place] [address], [place], [place] [address]   MRSA PCR Screening     Status: None   Collection Time: 06/24/2019  6:29 PM   Specimen: Nasopharyngeal  Result Value Ref [place] Status   MRSA by PCR NEGATIVE NEGATIVE Final    Comment:        The GeneXpert MRSA Assay (FDA approved for NASAL specimens only), is one component of a comprehensive MRSA colonization surveillance program. It is not intended to diagnose MRSA infection nor to guide or monitor treatment for MRSA infections. Performed at [facility], [place] [address], [place], [place] [address]   Culture, blood (routine x 2)     Status: None (Preliminary result)   Collection Time: 07/23/19 11:03 PM   Specimen: BLOOD  Result Value Ref [place] Status   Specimen Description BLOOD LEFT ANTECUBITAL  Final   Special Requests   Final    BOTTLES DRAWN AEROBIC ONLY Blood Culture adequate volume   Culture   Final    NO GROWTH 4 DAYS Performed at [facility], [place] [address], [place], [place] [address]    Report Status PENDING  Incomplete  Culture, blood (routine x 2)     Status: None (Preliminary result)   Collection Time: 07/23/19 11:08 PM   Specimen: BLOOD  Result Value Ref [place] Status   Specimen Description BLOOD LEFT ANTECUBITAL  Final   Special Requests   Final    BOTTLES DRAWN AEROBIC ONLY Blood  Culture adequate volume   Culture   Final    NO GROWTH 4 DAYS Performed at [facility], [place] [address] [place], [place] [address]    Report Status PENDING  Incomplete  Culture, Urine     Status: None   Collection Time: 07/24/19 10:45 AM   Specimen: Urine, Clean Catch  Result Value Ref [place] Status   Specimen Description URINE, CLEAN CATCH  Final   Special Requests NONE  Final   Culture   Final    NO GROWTH Performed at [facility], [place] [address] [place], [place] [address]    Report Status 07/25/2019 FINAL  Final  Culture, respiratory (non-expectorated)     Status: None   Collection Time: 07/25/19 11:24 AM   Specimen: Tracheal Aspirate; Respiratory  Result Value Ref [place] Status   Specimen Description TRACHEAL ASPIRATE  Final   Special Requests NONE  Final   Gram Stain   Final    RARE WBC PRESENT, PREDOMINANTLY PMN RARE GRAM POSITIVE RODS    Culture   Final    FEW Consistent with normal respiratory flora. Performed at [facility], [place] [address], [place], [place] [address]    Report Status 07/27/2019 FINAL  Final    Anti-infectives:  Anti-infectives (From admission, onward)   Start     Dose/Rate Route Frequency Ordered Stop  07/24/19 1000  vancomycin (VANCOCIN) IVPB 1000 mg/200 mL premix  Status:  Discontinued     1,000 mg 200 mL/hr over 60 Minutes Intravenous Every 12 hours 07/23/19 2309 07/26/19 1019   07/23/19 2300  vancomycin (VANCOREADY) IVPB 1500 mg/300 mL     1,500 mg 150 mL/hr over 120 Minutes Intravenous  Once 07/23/19 2245 07/24/19 0445   07/23/19 2300  piperacillin-tazobactam (ZOSYN) IVPB 3.375 g  Status:  Discontinued     3.375 g 12.5 mL/hr over 240 Minutes Intravenous Every 8 hours 07/23/19 2245 07/26/19 1019   07/06/2019 1645  ceFAZolin (ANCEF) IVPB 2g/100 mL premix     2 g 200 mL/hr over 30 Minutes Intravenous  Once 07/19/2019 1630 07/11/2019 1717      Best Practice/Protocols:  VTE Prophylaxis: Lovenox (prophylaxtic  dose) Continous Sedation  Consults: Treatment Team:  Lisbeth Renshaw, MD    Studies:    Events:  Subjective:    Overnight Issues:   Objective:  Vital signs for last 24 hours: Temp:  [98.2 F (36.8 C)-99.5 F (37.5 C)] 98.3 F (36.8 C) (02/05 0400) Pulse Rate:  [64-98] 65 (02/05 0700) Resp:  [19-25] 24 (02/05 0700) BP: (141-205)/(67-98) 147/76 (02/05 0700) SpO2:  [97 %-100 %] 100 % (02/05 0743) Arterial Line BP: (123-216)/(62-102) 132/74 (02/04 2100) FiO2 (%):  [30 %] 30 % (02/05 0743)  Hemodynamic parameters for last 24 hours:    Intake/Output from previous day: 02/04 0701 - 02/05 0700 In: 1288.9 [I.V.:866.3; NG/GT:152.7; IV Piggyback:269.9] Out: 2300 [Urine:2300]  Intake/Output this shift: No intake/output data recorded.  Vent settings for last 24 hours: Vent Mode: PRVC FiO2 (%):  [30 %] 30 % Set Rate:  [24 bmp] 24 bmp Vt Set:  [510 mL] 510 mL PEEP:  [5 cmH20] 5 cmH20 Plateau Pressure:  [19 cmH20-24 cmH20] 24 cmH20  Physical Exam:  General: on vent Neuro: arouses and F/C with RUE HEENT/Neck: ETT Resp: clear to auscultation bilaterally CVS: RRR GI: soft, NT Extremities: edema 1+  Results for orders placed or performed during the hospital encounter of 07/09/2019 (from the past 24 hour(s))  Glucose, capillary     Status: Abnormal   Collection Time: 07/27/19 11:32 AM  Result Value Ref [place]   Glucose-Capillary 138 (H) 70 - 99 mg/dL  Glucose, capillary     Status: Abnormal   Collection Time: 07/27/19  3:13 PM  Result Value Ref [place]   Glucose-Capillary 149 (H) 70 - 99 mg/dL  Glucose, capillary     Status: Abnormal   Collection Time: 07/27/19  7:26 PM  Result Value Ref [place]   Glucose-Capillary 120 (H) 70 - 99 mg/dL  Glucose, capillary     Status: Abnormal   Collection Time: 07/27/19 11:13 PM  Result Value Ref [place]   Glucose-Capillary 158 (H) 70 - 99 mg/dL  Glucose, capillary     Status: Abnormal   Collection Time: 07/28/19  3:33 AM  Result  Value Ref [place]   Glucose-Capillary 160 (H) 70 - 99 mg/dL  CBC     Status: Abnormal   Collection Time: 07/28/19  5:21 AM  Result Value Ref [place]   WBC 10.0 4.0 - 10.5 K/uL   RBC 2.93 (L) 4.22 - 5.81 MIL/uL   Hemoglobin 9.9 (L) 13.0 - 17.0 g/dL   HCT 79.0 (L) 24.0 - 97.3 %   MCV 105.1 (H) 80.0 - 100.0 fL   MCH 33.8 26.0 - 34.0 pg   MCHC 32.1 30.0 - 36.0 g/dL   RDW 53.2 99.2 - 42.6 %  Platelets 177 150 - 400 K/uL   nRBC 0.2 0.0 - 0.2 %  Basic metabolic panel     Status: Abnormal   Collection Time: 07/28/19  5:21 AM  Result Value Ref [place]   Sodium 150 (H) 135 - 145 mmol/L   Potassium 3.8 3.5 - 5.1 mmol/L   Chloride 113 (H) 98 - 111 mmol/L   CO2 25 22 - 32 mmol/L   Glucose, Bld 156 (H) 70 - 99 mg/dL   BUN 25 (H) 6 - 20 mg/dL   Creatinine, Ser 0.24 0.61 - 1.24 mg/dL   Calcium 8.8 (L) 8.9 - 10.3 mg/dL   GFR calc non Af Amer >60 >60 mL/min   GFR calc Af Amer >60 >60 mL/min   Anion gap 12 5 - 15    Assessment & Plan: Present on Admission: . ICH (intracerebral hemorrhage) (HCC)    LOS: 7 days   Additional comments:I reviewed the patient's new clinical lab test results. . Fall down 10 stairs  B/l SDH/SAH, Large R frontal contusion, tentorial and falcine SDH-NSGY on board (Dr. Conchita Paris), CT 1/31 with slightly worsened R frontal IPH and new bi-temporoparietal infarctions 2/2 vasospasm. MRI ordered by Neurology 2/5 shows the traumatic injuries are stable but also shows B temporal, B parietal, and R frontal infarcts. Reviewed MRI with Dr. Pearlean Brownie from the Stroke Service. These areas may actually be hemorrhagic contusions as there was blood in these areas on earlier CT head. No other treatment rec. L occipital skull fx, L temporal bone fx with blood in L middle ear- ENT on board (Dr Annalee Genta), recs for ciprodex OTIC BID for the duration of hospitalization. Will need audiogram. Seizures - on cEEG. Status epilepticus 1/31, now out of status, continuous EEG, Keppra/dilantin/Vimpat.  Versed off, MRI as above Acute hypoxic ventilator dependent respiratory failure- wean as able Pneumocephalus  Scalp laceration- stapled in ED Possible T1 fracture- maintain c-collar for now per NSGY, most likely degenerative.  L 10th rib fracture, old rib fractures L 9-10- pain control HTN - increase scheduled lopressor, on clonidine patch Bipolar disorder EtOH abuse- CIWA, 354 on admit, Precedex Thrombocytopenia - resolved FEN - on TF. Continue klon/sero VTE- PAS, resume Lovenox with PLTs up ID - off ABX Dispo- ICU Palliative following. MRI results may help his daughters make goals of care decisions. Critical Care Total Time*: 39 Minutes  Violeta Gelinas, MD, MPH, FACS Trauma & General Surgery Use [email or web address] to contact on call provider  07/28/2019  *Care during the described time interval was provided by me. I have reviewed this patient's available data, including medical history, events of note, physical examination and test results as part of my evaluation.

## 2019-07-28 NOTE — Progress Notes (Signed)
The chaplain visited with the family in support of a palliative care meeting.  The chaplain provided emotional and spiritual support through prayer and supportive conversation.  The chaplain will consult the on-call chaplain to keep a close watch on this patient.  Lavone Neri Chaplain Resident For questions concerning this note please contact me by pager [phone]

## 2019-07-29 DIAGNOSIS — Z515 Encounter for palliative care: Secondary | ICD-10-CM

## 2019-07-29 LAB — GLUCOSE, CAPILLARY: Glucose-Capillary: 128 mg/dL — ABNORMAL HIGH (ref 70–99)

## 2019-07-29 LAB — BASIC METABOLIC PANEL
Anion gap: 12 (ref 5–15)
BUN: 24 mg/dL — ABNORMAL HIGH (ref 6–20)
CO2: 22 mmol/L (ref 22–32)
Calcium: 8.7 mg/dL — ABNORMAL LOW (ref 8.9–10.3)
Chloride: 117 mmol/L — ABNORMAL HIGH (ref 98–111)
Creatinine, Ser: 0.82 mg/dL (ref 0.61–1.24)
GFR calc Af Amer: >60 mL/min (ref >60)
GFR calc non Af Amer: >60 mL/min (ref >60)
Glucose, Bld: 165 mg/dL — ABNORMAL HIGH (ref 70–99)
Potassium: 4 mmol/L (ref 3.5–5.1)
Sodium: 151 mmol/L — ABNORMAL HIGH (ref 135–145)

## 2019-07-29 LAB — CBC
HCT: 32.2 % — ABNORMAL LOW (ref 39.0–52.0)
Hemoglobin: 10 g/dL — ABNORMAL LOW (ref 13.0–17.0)
MCH: 33.6 pg (ref 26.0–34.0)
MCHC: 31.1 g/dL (ref 30.0–36.0)
MCV: 108.1 fL — ABNORMAL HIGH (ref 80.0–100.0)
Platelets: 210 10*3/uL (ref 150–400)
RBC: 2.98 MIL/uL — ABNORMAL LOW (ref 4.22–5.81)
RDW: 13.8 % (ref 11.5–15.5)
WBC: 11.1 10*3/uL — ABNORMAL HIGH (ref 4.0–10.5)
nRBC: 0.2 % (ref 0.0–0.2)

## 2019-07-29 MED ORDER — BIOTENE DRY MOUTH MT LIQD: 15.0000 mL | OROMUCOSAL | Status: DC | PRN

## 2019-07-29 MED ORDER — FENTANYL BOLUS VIA INFUSION
100.0000 ug | INTRAVENOUS | Status: DC | PRN
  Administered 2019-07-29: 100 ug via INTRAVENOUS
  Filled 2019-07-29: qty 100

## 2019-07-29 MED ORDER — GLYCOPYRROLATE 0.2 MG/ML IJ SOLN
0.2000 mg | INTRAMUSCULAR | Status: DC | PRN
  Administered 2019-07-29 (×2): 0.2 mg via INTRAVENOUS
  Filled 2019-07-29 (×2): qty 1

## 2019-07-29 MED ORDER — MIDAZOLAM 50MG/50ML (1MG/ML) PREMIX INFUSION
0.5000 mg/h | INTRAVENOUS | Status: DC
  Administered 2019-07-29: 2 mg/h via INTRAVENOUS
  Administered 2019-07-29: 4 mg/h via INTRAVENOUS
  Filled 2019-07-29 (×3): qty 50

## 2019-07-29 MED ORDER — POLYVINYL ALCOHOL 1.4 % OP SOLN
1.0000 [drp] | Freq: Four times a day (QID) | OPHTHALMIC | Status: DC | PRN
  Filled 2019-07-29: qty 15

## 2019-07-29 MED ORDER — HALOPERIDOL LACTATE 5 MG/ML IJ SOLN
0.5000 mg | INTRAMUSCULAR | Status: DC | PRN
  Filled 2019-07-29: qty 1

## 2019-07-29 MED ORDER — NICARDIPINE HCL IN NACL 20-0.86 MG/200ML-% IV SOLN
INTRAVENOUS | Status: AC
  Filled 2019-07-29: qty 200

## 2019-07-29 MED ORDER — MIDAZOLAM BOLUS VIA INFUSION
2.0000 mg | INTRAVENOUS | Status: DC | PRN
  Filled 2019-07-29: qty 2

## 2019-07-29 MED ORDER — GLYCOPYRROLATE 1 MG PO TABS
1.0000 mg | ORAL_TABLET | ORAL | Status: DC | PRN
  Filled 2019-07-29: qty 1

## 2019-07-29 MED ORDER — HALOPERIDOL 0.5 MG PO TABS
0.5000 mg | ORAL_TABLET | ORAL | Status: DC | PRN
  Filled 2019-07-29: qty 1

## 2019-07-29 MED ORDER — GLYCOPYRROLATE 0.2 MG/ML IJ SOLN: 0.2000 mg | INTRAMUSCULAR | Status: DC | PRN

## 2019-07-29 MED ORDER — HALOPERIDOL LACTATE 2 MG/ML PO CONC
0.5000 mg | ORAL | Status: DC | PRN
  Filled 2019-07-29: qty 0.3

## 2019-07-29 NOTE — Progress Notes (Signed)
Patient ID: Eddie Torres, male   DOB: 12-31-1960, 59 y.o.   MRN: [id number] Seems stable, neuro team following

## 2019-07-29 NOTE — Progress Notes (Signed)
Patient ID: Eddie Torres, male   DOB: 1960-12-28, 59 y.o.   MRN: [id number]  Follow up - Trauma and Critical Care  Patient Details:    Eddie Torres is an 59 y.o. male.  Lines/tubes : Airway 7.5 mm (Active)  Secured at (cm) 25 cm 07/29/19 0807  Measured From Lips 07/29/19 0807  Secured Location Left 07/29/19 0807  Secured By [employer] 07/29/19 0807  Tube Holder Repositioned Yes 07/29/19 0807  Cuff Pressure (cm H2O) 30 cm H2O 07/29/19 0111  Site Condition Dry 07/29/19 0807     NG/OG Tube Orogastric Center mouth Aucultation (Active)  External Length of Tube (cm) - (if applicable) 57 cm 07/28/19 2000  Site Assessment Clean;Intact 07/29/19 0400  Ongoing Placement Verification No change in cm markings or external length of tube from initial placement;No change in respiratory status;No acute changes, not attributed to clinical condition 07/29/19 0400  Status Infusing tube feed 07/29/19 0400  Amount of suction 110 mmHg 07/26/19 1600  Drainage Appearance Bile 07/26/19 1600  Output (mL) 200 mL 07/23/19 0600     Urethral Catheter 16 Fr. (Active)  Indication for Insertion or Continuance of Catheter Acute urinary retention (I&O Cath for 24 hrs prior to catheter insertion- Inpatient Only) 07/28/19 0435  Site Assessment Clean;Intact;Dry 07/29/19 0400  Catheter Maintenance Bag below level of bladder;Catheter secured;Insertion date on drainage bag;Drainage bag/tubing not touching floor;No dependent loops;Seal intact;Bag emptied prior to transport 07/29/19 0400  Collection Container Standard drainage bag 07/29/19 0400  Securement Method Securing device (Describe) 07/29/19 0400  Urinary Catheter Interventions (if applicable) Unclamped 07/29/19 0400  Output (mL) 550 mL 07/29/19 0600    Microbiology/Sepsis markers: Results for orders placed or performed during the hospital encounter of 2019/07/29  Respiratory Panel by RT PCR (Flu A&B, Covid) - Nasopharyngeal Swab     Status: None    Collection Time: 07/29/19  4:14 PM   Specimen: Nasopharyngeal Swab  Result Value Ref Range Status   SARS Coronavirus 2 by RT PCR NEGATIVE NEGATIVE Final    Comment: (NOTE) SARS-CoV-2 target nucleic acids are NOT DETECTED. The SARS-CoV-2 RNA is generally detectable in upper respiratoy specimens during the acute phase of infection. The lowest concentration of SARS-CoV-2 viral copies this assay can detect is 131 copies/mL. A negative result does not preclude SARS-Cov-2 infection and should not be used as the sole basis for treatment or other patient management decisions. A negative result may occur with  improper specimen collection/handling, submission of specimen other than nasopharyngeal swab, presence of viral mutation(s) within the areas targeted by this assay, and inadequate number of viral copies (<131 copies/mL). A negative result must be combined with clinical observations, patient history, and epidemiological information. The expected result is Negative. Fact Sheet for Patients:  https://www.moore.com/ Fact Sheet for Healthcare Providers:  https://www.young.biz/ This test is not yet ap proved or cleared by the [place] FDA and  has been authorized for detection and/or diagnosis of SARS-CoV-2 by FDA under an Emergency Use Authorization (EUA). This EUA will remain  in effect (meaning this test can be used) for the duration of the COVID-19 declaration under Section 564(b)(1) of the Act, 21 U.S.C. section 360bbb-3(b)(1), unless the authorization is terminated or revoked sooner.    Influenza A by PCR NEGATIVE NEGATIVE Final   Influenza B by PCR NEGATIVE NEGATIVE Final    Comment: (NOTE) The Xpert Xpress SARS-CoV-2/FLU/RSV assay is intended as an aid in  the diagnosis of influenza from Nasopharyngeal swab specimens and  should not  be used as a sole basis for treatment. Nasal washings and  aspirates are unacceptable for Xpert Xpress  SARS-CoV-2/FLU/RSV  testing. Fact Sheet for Patients: https://www.moore.com/ Fact Sheet for Healthcare Providers: https://www.young.biz/ This test is not yet approved or cleared by the [place] FDA and  has been authorized for detection and/or diagnosis of SARS-CoV-2 by  FDA under an Emergency Use Authorization (EUA). This EUA will remain  in effect (meaning this test can be used) for the duration of the  Covid-19 declaration under Section 564(b)(1) of the Act, 21  U.S.C. section 360bbb-3(b)(1), unless the authorization is  terminated or revoked. Performed at [facility], [address] [place], [place] [address]   MRSA PCR Screening     Status: None   Collection Time: Aug 20, 2019  6:29 PM   Specimen: Nasopharyngeal  Result Value Ref Range Status   MRSA by PCR NEGATIVE NEGATIVE Final    Comment:        The GeneXpert MRSA Assay (FDA approved for NASAL specimens only), is one component of a comprehensive MRSA colonization surveillance program. It is not intended to diagnose MRSA infection nor to guide or monitor treatment for MRSA infections. Performed at [facility], [address] [place], [place] [address]   Culture, blood (routine x 2)     Status: None   Collection Time: 07/23/19 11:03 PM   Specimen: BLOOD  Result Value Ref Range Status   Specimen Description BLOOD LEFT ANTECUBITAL  Final   Special Requests   Final    BOTTLES DRAWN AEROBIC ONLY Blood Culture adequate volume   Culture   Final    NO GROWTH 5 DAYS Performed at [facility], [address], [place], [place] [address]    Report Status 07/28/2019 FINAL  Final  Culture, blood (routine x 2)     Status: None   Collection Time: 07/23/19 11:08 PM   Specimen: BLOOD  Result Value Ref Range Status   Specimen Description BLOOD LEFT ANTECUBITAL  Final   Special Requests   Final    BOTTLES DRAWN AEROBIC ONLY Blood Culture adequate volume   Culture    Final    NO GROWTH 5 DAYS Performed at [facility], [address], [place], [place] [address]    Report Status 07/28/2019 FINAL  Final  Culture, Urine     Status: None   Collection Time: 07/24/19 10:45 AM   Specimen: Urine, Clean Catch  Result Value Ref Range Status   Specimen Description URINE, CLEAN CATCH  Final   Special Requests NONE  Final   Culture   Final    NO GROWTH Performed at [facility], [address], [place], [place] [address]    Report Status 07/25/2019 FINAL  Final  Culture, respiratory (non-expectorated)     Status: None   Collection Time: 07/25/19 11:24 AM   Specimen: Tracheal Aspirate; Respiratory  Result Value Ref Range Status   Specimen Description TRACHEAL ASPIRATE  Final   Special Requests NONE  Final   Gram Stain   Final    RARE WBC PRESENT, PREDOMINANTLY PMN RARE GRAM POSITIVE RODS    Culture   Final    FEW Consistent with normal respiratory flora. Performed at [facility], [address], [place], [place] [address]    Report Status 07/27/2019 FINAL  Final    Anti-infectives:  Anti-infectives (From admission, onward)   Start     Dose/Rate Route Frequency Ordered Stop  07/24/19 1000  vancomycin (VANCOCIN) IVPB 1000 mg/200 mL premix  Status:  Discontinued     1,000 mg 200 mL/hr over 60 Minutes Intravenous Every 12 hours 07/23/19 2309 07/26/19 1019   07/23/19 2300  vancomycin (VANCOREADY) IVPB 1500 mg/300 mL     1,500 mg 150 mL/hr over 120 Minutes Intravenous  Once 07/23/19 2245 07/24/19 0445   07/23/19 2300  piperacillin-tazobactam (ZOSYN) IVPB 3.375 g  Status:  Discontinued     3.375 g 12.5 mL/hr over 240 Minutes Intravenous Every 8 hours 07/23/19 2245 07/26/19 1019   Jul 24, 2019 1645  ceFAZolin (ANCEF) IVPB 2g/100 mL premix     2 g 200 mL/hr over 30 Minutes Intravenous  Once 2019/07/24 1630 07/24/2019 1717      Best Practice/Protocols:  VTE Prophylaxis: Lovenox (prophylaxtic dose) Continous Sedation Consults: Treatment  Team:  Consuella Lose, MD   Chief Complaint/Subjective:    Overnight Issues: Per nursing, the family wishes to proceed with terminal extubation today instead of waiting until Monday.  Objective:  Vital signs for last 24 hours: Temp:  [97.8 F (36.6 C)-99.8 F (37.7 C)] 97.8 F (36.6 C) (02/06 0800) Pulse Rate:  [58-98] 84 (02/06 0807) Resp:  [18-24] 24 (02/06 0807) BP: (148-206)/(69-101) 161/78 (02/06 0807) SpO2:  [99 %-100 %] 100 % (02/06 0807) FiO2 (%):  [30 %] 30 % (02/06 0807)  Hemodynamic parameters for last 24 hours:    Intake/Output from previous day: 02/05 0701 - 02/06 0700 In: 2583.3 [I.V.:868.7; NG/GT:1363.3; IV Piggyback:351.2] Out: 2400 [Urine:2400]  Intake/Output this shift: No intake/output data recorded.  Vent settings for last 24 hours: Vent Mode: PRVC FiO2 (%):  [30 %] 30 % Set Rate:  [24 bmp] 24 bmp Vt Set:  [510 mL] 510 mL PEEP:  [5 cmH20] 5 cmH20 Plateau Pressure:  [17 cmH20-22 cmH20] 22 cmH20  Physical Exam:  General: on vent Neuro: unresponsive HEENT/Neck: ETT Resp: clear to auscultation bilaterally CVS: RRR GI: soft, NT Extremities: edema 1+  Results for orders placed or performed during the hospital encounter of 07-24-19 (from the past 24 hour(s))  Phenytoin level, total     Status: Abnormal   Collection Time: 07/28/19 11:02 AM  Result Value Ref Range   Phenytoin Lvl <2.5 (L) 10.0 - 20.0 ug/mL  Glucose, capillary     Status: Abnormal   Collection Time: 07/28/19 12:02 PM  Result Value Ref Range   Glucose-Capillary 161 (H) 70 - 99 mg/dL  Glucose, capillary     Status: Abnormal   Collection Time: 07/28/19  4:13 PM  Result Value Ref Range   Glucose-Capillary 155 (H) 70 - 99 mg/dL  Glucose, capillary     Status: Abnormal   Collection Time: 07/28/19  7:34 PM  Result Value Ref Range   Glucose-Capillary 150 (H) 70 - 99 mg/dL  Glucose, capillary     Status: Abnormal   Collection Time: 07/28/19 11:27 PM  Result Value Ref Range    Glucose-Capillary 141 (H) 70 - 99 mg/dL  CBC     Status: Abnormal   Collection Time: 07/29/19  3:26 AM  Result Value Ref Range   WBC 11.1 (H) 4.0 - 10.5 K/uL   RBC 2.98 (L) 4.22 - 5.81 MIL/uL   Hemoglobin 10.0 (L) 13.0 - 17.0 g/dL   HCT 32.2 (L) 39.0 - 52.0 %   MCV 108.1 (H) 80.0 - 100.0 fL   MCH 33.6 26.0 - 34.0 pg   MCHC 31.1 30.0 - 36.0 g/dL   RDW 13.8 11.5 - 15.5 %  Platelets 210 150 - 400 K/uL   nRBC 0.2 0.0 - 0.2 %  Basic metabolic panel     Status: Abnormal   Collection Time: 07/29/19  3:26 AM  Result Value Ref Range   Sodium 151 (H) 135 - 145 mmol/L   Potassium 4.0 3.5 - 5.1 mmol/L   Chloride 117 (H) 98 - 111 mmol/L   CO2 22 22 - 32 mmol/L   Glucose, Bld 165 (H) 70 - 99 mg/dL   BUN 24 (H) 6 - 20 mg/dL   Creatinine, Ser 5.62 0.61 - 1.24 mg/dL   Calcium 8.7 (L) 8.9 - 10.3 mg/dL   GFR calc non Af Amer >60 >60 mL/min   GFR calc Af Amer >60 >60 mL/min   Anion gap 12 5 - 15  Glucose, capillary     Status: Abnormal   Collection Time: 07/29/19  8:21 AM  Result Value Ref Range   Glucose-Capillary 128 (H) 70 - 99 mg/dL   Comment 1 Notify RN    Comment 2 Document in Chart      Assessment/Plan:   Fall down 10 stairs  B/l SDH/SAH, Large R frontal contusion, tentorial and falcine SDH-NSGY on board (Dr. Conchita Paris), CT 1/31 with slightly worsened R frontal IPH and new bi-temporoparietal infarctions 2/2 vasospasm. MRI ordered by Neurology 2/5 shows the traumatic injuries are stable but also shows B temporal, B parietal, and R frontal infarcts. Reviewed MRI with Dr. Pearlean Brownie from the Stroke Service. These areas may actually be hemorrhagic contusions as there was blood in these areas on earlier CT head. No other treatment rec. L occipital skull fx, L temporal bone fx with blood in L middle ear-ENT on board (Dr Annalee Genta), recs for ciprodex OTIC BID for the duration of hospitalization. Will need audiogram. Seizures - on cEEG. Status epilepticus 1/31, now out of status, continuous  EEG, Keppra/dilantin/Vimpat. Versed off, MRI as above Acute hypoxic ventilator dependent respiratory failure-wean as able Pneumocephalus  Scalp laceration- stapled in ED Possible T1 fracture- maintain c-collar for now per NSGY, most likely degenerative.  L 10th rib fracture, old rib fractures L 9-10- pain control HTN - increase scheduled lopressor, on clonidine patch Bipolar disorder EtOH abuse- CIWA, 354 on admit, Precedex Thrombocytopenia - resolved FEN - on TF. Continue klon/sero VTE- PAS, resume Lovenox with PLTs up ID - off ABX Dispo- ICU Palliative Care - to work with family on terminal extubation   LOS: 8 days   Additional comments:I reviewed the patient's new clinical lab test results. CBC, BMP  Critical Care Total Time*: 10 Minutes  Wynona Luna 07/29/2019  *Care during the described time interval was provided by me and/or other providers on the critical care team.  I have reviewed this patient's available data, including medical history, events of note, physical examination and test results as part of my evaluation.

## 2019-07-29 NOTE — Progress Notes (Signed)
Daily Progress Note   Patient Name: Eddie Torres       Date: 07/29/2019 DOB: 1961-05-23  Age: 59 y.o. MRN#: [id number] Attending Physician: Particia Jasper, MD Primary Care Physician: Patient, No Pcp Per Admit Date: 07/04/2019  Reason for Consultation/Follow-up: Establishing goals of care  Subjective: Patient's daughters and other family members present for transition to full comfort. A great amount of time was spent providing support to family as they explored option for patient to be an organ donor with [employer]. Ultimately, it was decided he would not be a candidate. Plan made for transition to full comfort and extubation. I remained at bedside as patient was extubated to room air. He showed no signs of distress.  Review of Systems  Unable to perform ROS: Mental acuity    Length of Stay: 8  Current Medications: Scheduled Meds:  . bethanechol  25 mg Per Tube TID  . chlorhexidine gluconate (MEDLINE KIT)  15 mL Mouth Rinse BID  . Chlorhexidine Gluconate Cloth  6 each Topical Daily  . docusate  100 mg Per Tube Daily  . fentaNYL (SUBLIMAZE) injection  50 mcg Intravenous Once  . mouth rinse  15 mL Mouth Rinse 10 times per day  . phenytoin (DILANTIN) IV  100 mg Intravenous Q8H  . QUEtiapine  50 mg Per Tube BID  . sodium chloride flush  10-40 mL Intracatheter Q12H    Continuous Infusions: . sodium chloride    . sodium chloride    . fentaNYL infusion INTRAVENOUS 200 mcg/hr (07/29/19 0941)  . lacosamide (VIMPAT) IV 100 mg (07/29/19 0941)  . levETIRAcetam 1,500 mg (07/29/19 0921)  . midazolam      PRN Meds: Place/Maintain arterial line **AND** sodium chloride, acetaminophen (TYLENOL) oral liquid 160 mg/5 mL, antiseptic oral rinse, fentaNYL, glycopyrrolate **OR**  glycopyrrolate **OR** glycopyrrolate, haloperidol **OR** haloperidol **OR** haloperidol lactate, midazolam, ondansetron **OR** ondansetron (ZOFRAN) IV, polyvinyl alcohol, sodium chloride flush  Physical Exam Vitals and nursing note reviewed.  Cardiovascular:     Rate and Rhythm: Normal rate.     Comments: Diffuse anasarca Psychiatric:     Comments: Unresponsive to any stimuli              Vital Signs: BP (!) 160/73   Pulse 67   Temp 97.8 F (36.6 C) (Axillary)  Resp (!) 24   Ht 5' 6"  (1.676 m)   Wt 96.7 kg   SpO2 99%   BMI 34.41 kg/m  SpO2: SpO2: 99 % O2 Device: O2 Device: Ventilator O2 Flow Rate:    Intake/output summary:   Intake/Output Summary (Last 24 hours) at 07/29/2019 1040 Last data filed at 07/29/2019 0600 Gross per 24 hour  Intake 2380.19 ml  Output 2400 ml  Net -19.81 ml   LBM: Last BM Date: (pta) Baseline Weight: Weight: 100 kg Most recent weight: Weight: 96.7 kg       Palliative Assessment/Data: PPS: 10%      Patient Active Problem List   Diagnosis Date Noted  . Fall   . Cerebrovascular accident (CVA) ([place])   . Advanced care planning/counseling discussion   . Goals of care, counseling/discussion   . Palliative care by specialist   . Head injury   . DNR (do not resuscitate) discussion   . Respiratory failure ([place])   . ICH (intracerebral hemorrhage) ([place]) 07/06/2019    Palliative Care Assessment & Plan   Patient Profile: 59 y.o. male  admitted on 07/05/2019 who was brought in as a level 1 trauma after fall down estimated 10 stairs. Fall was not witnessed but heard by roommates and they found him down at the bottom of the stairs. EMS reported obvious head trauma and GCS of 3. Patient lived in a "home for elder men". Friends were unable to provide any medical history. On arrival patient with [phone]). Intubated for airway protection. Moved bilateral UEs with good strength prior to intubation. Stellate L posterior scalp wound ~4-5 cm at longest  point. Per patient's other chart, PMH significant for HTN, EtOH abuse, Bipolar disorder. MRI yesterday shows several stable areas of hemmorhage and new findings of multiple areas of infarct. Palliative medicine consulted for goals of care.   Assessment/Recommendations/Plan   Patient transitioned to full comfort  Comfort medications via continuous infusion and prn bolus (fentanyl and versed)  All interventions that are not required for comfort have been discontinued  Goals of Care and Additional Recommendations:  Limitations on Scope of Treatment: Full Comfort Care  Code Status:  DNR  Prognosis:   Hours - Days   Discharge Planning:  Anticipated Hospital Death  Care plan was discussed with patient's daughters  Thank you for allowing the Palliative Medicine Team to assist in the care of this patient.   Time In: 1000 Time Out: 1200 Total Time 120 minutes Prolonged Time Billed Yes      Greater than 50%  of this time was spent counseling and coordinating care related to the above assessment and plan.  Mariana Kaufman, AGNP-C Palliative Medicine   Please contact Palliative Medicine Team phone at [phone] for questions and concerns.

## 2019-07-29 NOTE — Progress Notes (Signed)
Changed vent mode to PSV 5/5 30%, per MD

## 2019-07-29 NOTE — Progress Notes (Signed)
   07/29/19 1038  Clinical Encounter Type  Visited With Family;Health care provider  Visit Type Other (Comment) (Compassionate Withdrawal)  Referral From Nurse  Consult/Referral To Chaplain  Spiritual Encounters  Spiritual Needs Emotional;Grief support;Prayer  Stress Factors  Patient Stress Factors Loss  Family Stress Factors Loss   Chaplain responded to consult for compassionate withdraw of care. Eddie Torres's daughters were at bedside with their mother, uncle, and the husband of a daughter. Family was actively having on way conversations with Eddie Torres, expressing their love for him and memories, as well as their desires to have him here on earth longer. Chaplain held prayer with the family at their request, and proceeded to locate medical staff for extubation once the family made known that they were ready. Chaplain approached 4N RN and Palliative NP to initiate the process of withdrawal and learned from the 4N RN that [name] is an organ donor designated by his driver's licence. Family was awaiting the arrival of CDS so that they could receive more information and have their questions answered. Chaplain remains available for support as needs arise.   Chaplain Resident, Amado Coe, M Div [phone] On-call pager

## 2019-07-29 NOTE — Progress Notes (Signed)
Patient extubated per MD's order. 

## 2019-07-29 NOTE — Progress Notes (Signed)
CDS referral made this morning. Family is very tearful at the bedside and has expressed a strong wish for compassionate withdrawal from the ventilator today. CDS has requested that we hold off on extubation at this time. RN will address family and patient needs with the palliative care team at this time and will wait for guidance on the next steps.

## 2019-07-31 ENCOUNTER — Encounter (HOSPITAL_COMMUNITY): Payer: Self-pay | Admitting: *Deleted

## 2019-08-21 NOTE — Progress Notes (Signed)
Received pt unresponsive. Family at bedside. Educated pt's family with comfort goals of care.

## 2019-08-21 NOTE — Discharge Summary (Signed)
Family elected for withdrawal of care. Terminal extubation performed 08/11/2019 at 1417, time of death declared 08-12-19 at 0137.  Diamantina Monks, MD General and Trauma Surgery [facility]

## 2019-08-21 NOTE — Progress Notes (Signed)
Wasted IV meds with Albin Felling, RN. 20 ml Vimpat, 62 ML Fentanyl, 20 ml Versed. Wasted at [employer].

## 2019-08-21 NOTE — Progress Notes (Signed)
   2019-08-24 0200  Attending Physican Contact  Attending Physician Notified Y  Attending Physician (First and Last Name) Marcille Blanco  Will the above attending physician sign death certificate? No  Physician (First and Last Name) Who Will Sign Death Certificate Asencion Partridge  Post Mortem Checklist  Date of Death 2019-08-24  Time of Death 0137  Pronounced By Freddy Jaksch, RN and Albin Felling, RN  Next of kin notified Yes  Name of next of kin notified of death Taesean Reth  Contact Person's Relationship to Patient Daughter  Contact Person's Phone Number [phone]  Contact Person's address [address], [place], [place] [address]  Was the patient a No Code Blue or a Limited Code Blue? No  Did the patient die unattended? No  Patient restrained? Not applicable  Height 5\' 6"  (1.676 m)  Weight 96.7 kg  Donor Services  Notification Date 07/29/19  Notification Time 1108  [place] Donor Service Number 09/26/19  Is patient a potential donor? Y  Donation Type Tissue  Eye prep completed Yes  Autopsy  Autopsy requested by N/A  Patient Belongings/Medications Returned  Patient belongings from bedside/safe/pharmacy returned  Yes  Valuables returned to? [phone]  Specify valuables returned clothings  Dead on Arrival (Emergency Department)  Patient dead on arrival? No  Notifications  Patient Placement notified that Post Mortem checklist is complete Yes  Patient Placement notified body transferred Transported to [email or web address]  Medical Examiner  Is this a medical examiner's case? Y  Date Medical Examiner notified 08/24/19  Time Medical Examiner notified [phone]  Name of Medical Examiner [id number]  Name of person who notified medical examiner Asencion Partridge  Does the Medical Examiner consider this an ME case? Asencion Partridge Home  Funeral home name/address/phone # [facility]ot Ables [facility] - [address] [place] - [phone]  Planned location of pickup [facility]

## 2019-08-21 DEATH — deceased

## 2020-04-16 IMAGING — CT CT HEAD W/O CM
3 of 4 series · 12 of 47 positions shown, 14 images · non-contrast
Comparison: CT head 07/21/2019
COMPARISON: CT head 07/21/2019

Addendum:
CLINICAL DATA: Subarachnoid hemorrhage follow-up

EXAM:
CT HEAD WITHOUT CONTRAST
TECHNIQUE: Contiguous axial images were obtained from the base of the skull
through the vertex without intravenous contrast.

[Series 2: head wo · axial · 0.51mm/px · z∈[+1158,+1288]mm · 7 of 36 slices shown, 9 images]
[im 5/36  brain]
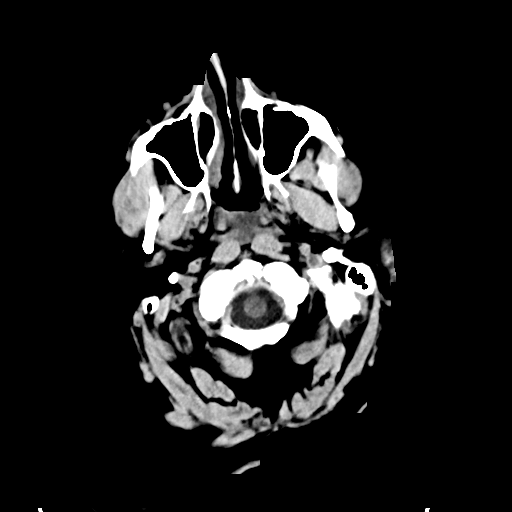
[im 5/36  bone]
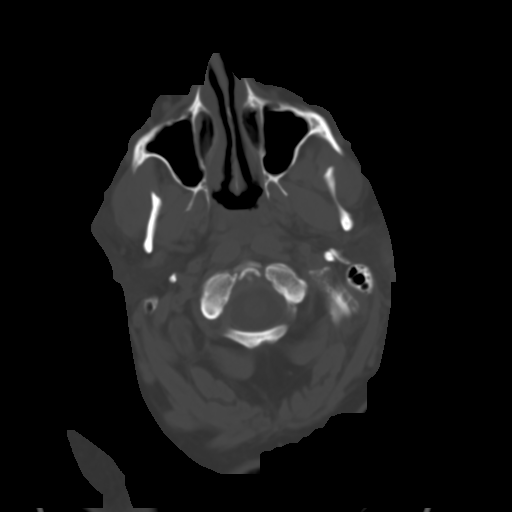
[im 9/36  brain]
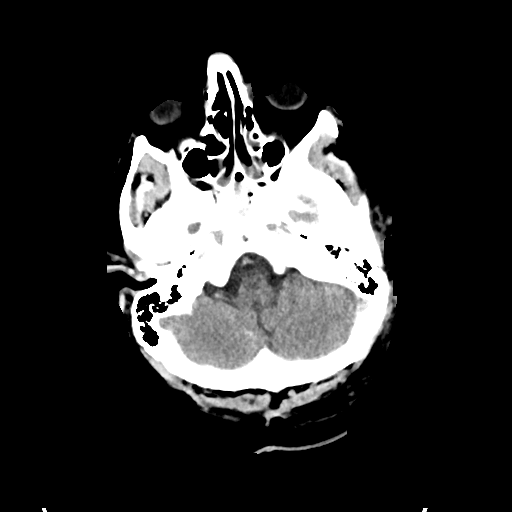
[im 14/36  brain]
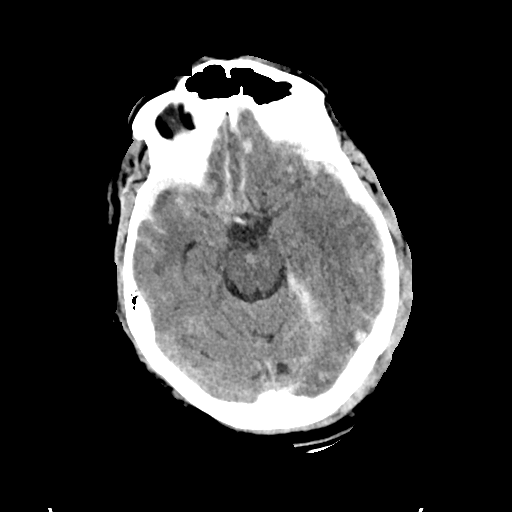
[im 18/36  brain]
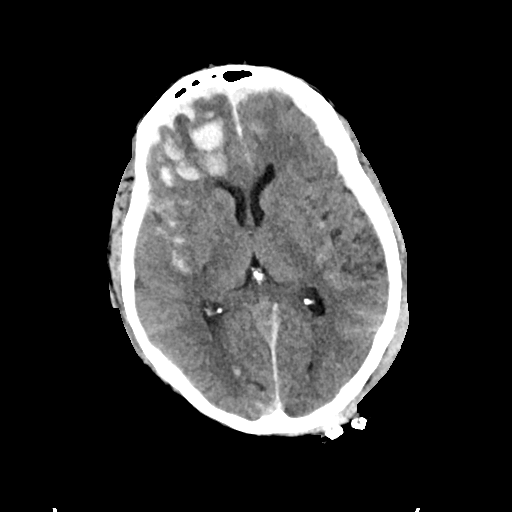
[im 22/36  brain]
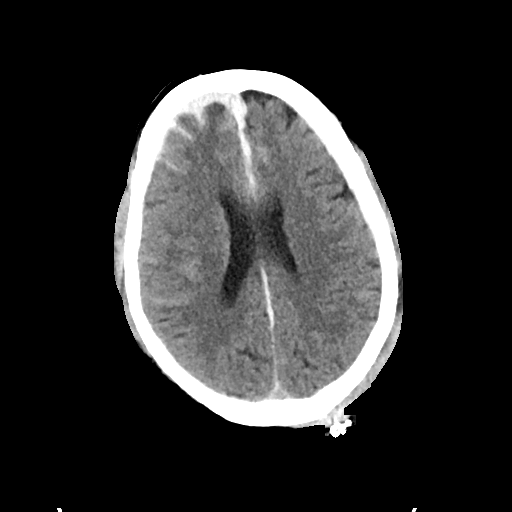
[im 22/36  bone]
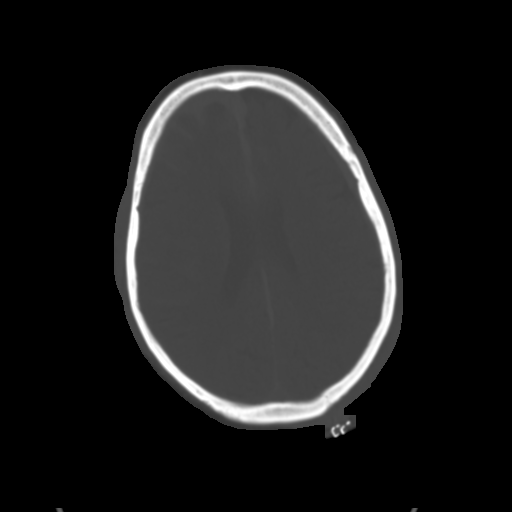
[im 27/36  brain]
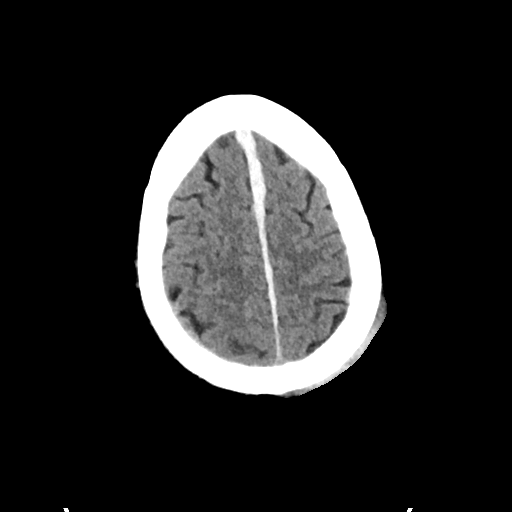
[im 31/36  brain]
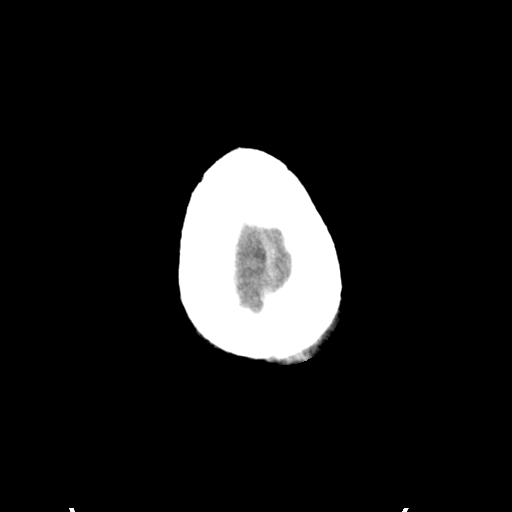

[Series 3: head bone · axial · 0.51mm/px · z∈[+1154,+1172]mm · 2 of 90 slices shown]
[im 9/90  bone]
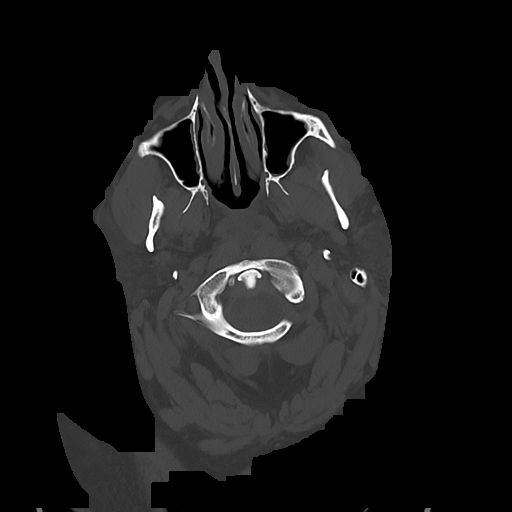
[im 18/90  bone]
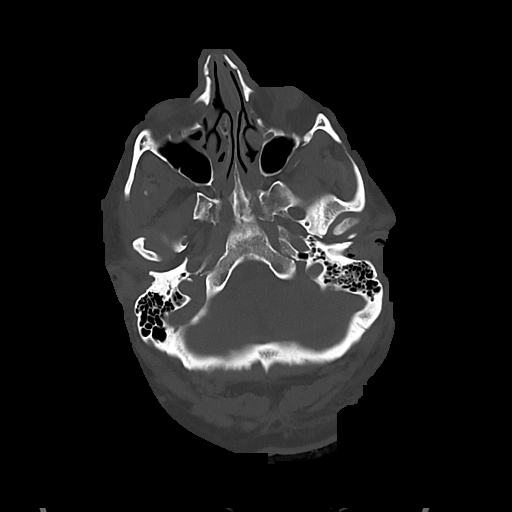

[Series 4: cor soft · coronal · 0.35mm/px · 3 of 81 slices shown]
[im 27/81  brain]
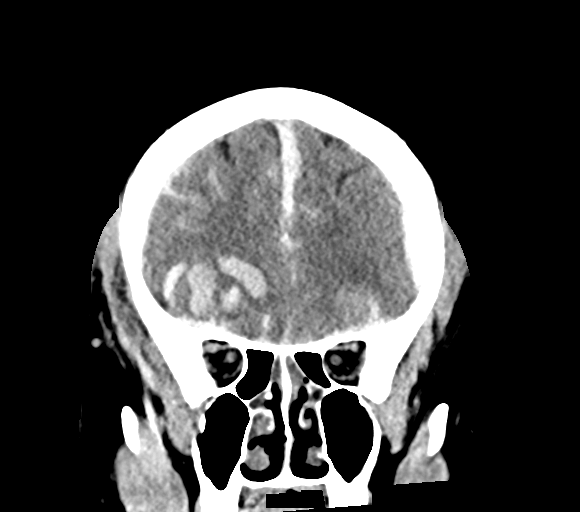
[im 36/81  brain]
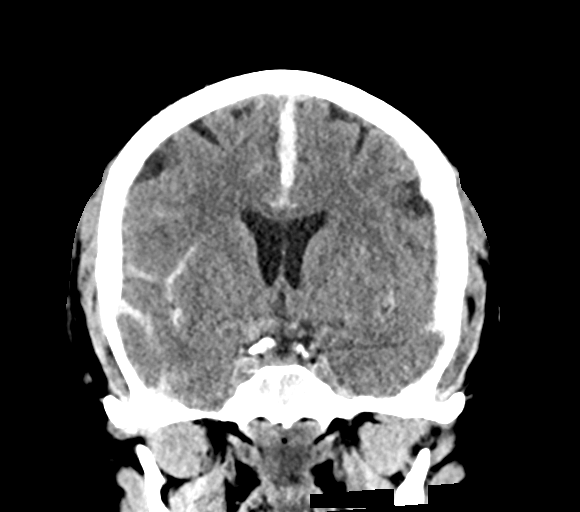
[im 45/81  brain]
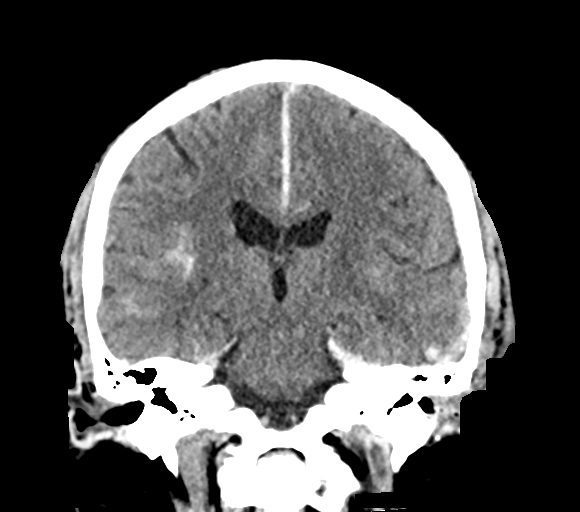

[12 of 47 positions shown; findings below may reference images not displayed]

FINDINGS: Brain: Enlarging size of a right frontal hemorrhagic contusion
measuring 1.8 x 2.8 cm in size ([DATE]) with some additional a
developing a parenchymal contusion laterally. Persistent
pneumocephaly along the left frontal convexity and anterior falx.
Large left subdural hematoma measuring up to 5 mm in maximal
thickness. Additional subdural hemorrhage collecting over the right
anterior frontal convexity also measuring up to 5 mm in thickness.
Extensive of parafalcine subdural hemorrhage is noted as well
measuring up to 6 mm in thickness anteriorly. Fluid is again seen
layering along the left bowels and left tentorium. Though some small
amount of hemorrhage is seen along the medial right tentorial
leaflet as well. Extensive bilateral subarachnoid hemorrhages
extending across the frontotemporal parietal regions in sylvian
fissures, right slightly greater than left, slightly increasing in
the right from comparison study. There is trace hemorrhage in the
occipital horns of the lateral ventricles, new from prior. No
midline shift. Basilar cisterns remain patent. No superimposed areas
of cortical infarct.

Vascular: Atherosclerotic calcification of the carotid siphons. No
hyperdense vessel.

Skull: Complex left temporal bone fracture with diastasis of the
petrous quite most ule suture. With additional fracture adjacent the
lambdoid suture as well. Extensive overlying scalp hematoma with
skin staples and bandaging material seen posteriorly.

There is a fracture through the left sphenoid wall and curvilinear
fracture through the plane of sphenoidale P right lateral wall
sphenoid fracture is also suspected ([DATE]).

Sinuses/Orbits: Extensive ethmoidal and sphenoidal hemosinus.
Minimal mural thickening in the maxillary sinuses. Frontal sinuses
demonstrate some layering pneumatized secretions, possibly
additional hemorrhage. Left mastoid middle ear opacification
compatible with hemorrhage. Right middle ear cavity and mastoids
remain patent. No retro septal stranding or gas seen within the
orbits.

Other: None
IMPRESSION: Extensive multifocal posttraumatic intracranial hemorrhage with
enlarging areas of hemorrhagic parenchymal contusion in the right
frontal lobe, increasing size of a right frontotemporal subarachnoid
hemorrhages, new hemorrhage along the right tentorial leaflet and
developing intraventricular hemorrhage in the occipital horns.

Redemonstration of complex left temporal bone fracture with adjacent
suture all diastasis and cephalad extension to the lambdoid suture.
Small amount of pneumocephaly along the left frontal convexity.

Skull base fractures at the level of the planum sphenoidale and
sphenoid sinuses which extend across the cavernous sinuses. Caution
for intra cavernous injury is recommended, and if expected, CTA and
CTV could be considered. Small amount of anterior right parafalcine
pneumocephaly as well.

No associated midline shift or ventriculomegaly at this time.
Basilar cisterns are patent.

Currently attempting to contact the ordering provider with a
critical value result. Addendum will be submitted upon case
discussion.

ADDENDUM:
Trace amount posterior fossa subarachnoid hemorrhage particularly
along the right cerebellar hemisphere is also unchanged from prior.

Initial findings and addendum were called by telephone on 07/21/2019
at [DATE] to provider KARLA-GERDO GALKIN , who verbally acknowledged
these results.

*** End of Addendum ***
FINDINGS: Brain: Enlarging size of a right frontal hemorrhagic contusion
measuring 1.8 x 2.8 cm in size ([DATE]) with some additional a
developing a parenchymal contusion laterally. Persistent
pneumocephaly along the left frontal convexity and anterior falx.
Large left subdural hematoma measuring up to 5 mm in maximal
thickness. Additional subdural hemorrhage collecting over the right
anterior frontal convexity also measuring up to 5 mm in thickness.
Extensive of parafalcine subdural hemorrhage is noted as well
measuring up to 6 mm in thickness anteriorly. Fluid is again seen
layering along the left bowels and left tentorium. Though some small
amount of hemorrhage is seen along the medial right tentorial
leaflet as well. Extensive bilateral subarachnoid hemorrhages
extending across the frontotemporal parietal regions in sylvian
fissures, right slightly greater than left, slightly increasing in
the right from comparison study. There is trace hemorrhage in the
occipital horns of the lateral ventricles, new from prior. No
midline shift. Basilar cisterns remain patent. No superimposed areas
of cortical infarct.

Vascular: Atherosclerotic calcification of the carotid siphons. No
hyperdense vessel.

Skull: Complex left temporal bone fracture with diastasis of the
petrous quite most ule suture. With additional fracture adjacent the
lambdoid suture as well. Extensive overlying scalp hematoma with
skin staples and bandaging material seen posteriorly.

There is a fracture through the left sphenoid wall and curvilinear
fracture through the plane of sphenoidale P right lateral wall
sphenoid fracture is also suspected ([DATE]).

Sinuses/Orbits: Extensive ethmoidal and sphenoidal hemosinus.
Minimal mural thickening in the maxillary sinuses. Frontal sinuses
demonstrate some layering pneumatized secretions, possibly
additional hemorrhage. Left mastoid middle ear opacification
compatible with hemorrhage. Right middle ear cavity and mastoids
remain patent. No retro septal stranding or gas seen within the
orbits.

Other: None
IMPRESSION: Extensive multifocal posttraumatic intracranial hemorrhage with
enlarging areas of hemorrhagic parenchymal contusion in the right
frontal lobe, increasing size of a right frontotemporal subarachnoid
hemorrhages, new hemorrhage along the right tentorial leaflet and
developing intraventricular hemorrhage in the occipital horns.

Redemonstration of complex left temporal bone fracture with adjacent
suture all diastasis and cephalad extension to the lambdoid suture.
Small amount of pneumocephaly along the left frontal convexity.

Skull base fractures at the level of the planum sphenoidale and
sphenoid sinuses which extend across the cavernous sinuses. Caution
for intra cavernous injury is recommended, and if expected, CTA and
CTV could be considered. Small amount of anterior right parafalcine
pneumocephaly as well.

No associated midline shift or ventriculomegaly at this time.
Basilar cisterns are patent.

Currently attempting to contact the ordering provider with a
critical value result. Addendum will be submitted upon case
discussion.

## 2020-04-16 IMAGING — DX DG CHEST 1V PORT
1 series · 1 of 1 positions shown · non-contrast
Comparison: Radiographs 03/09/2017 and 05/22/2015.

CLINICAL DATA: Level 1 trauma. Patient fell down stairs.
Unresponsive with bleeding from ears.

EXAM:
PORTABLE CHEST 1 VIEW

[chest ap]
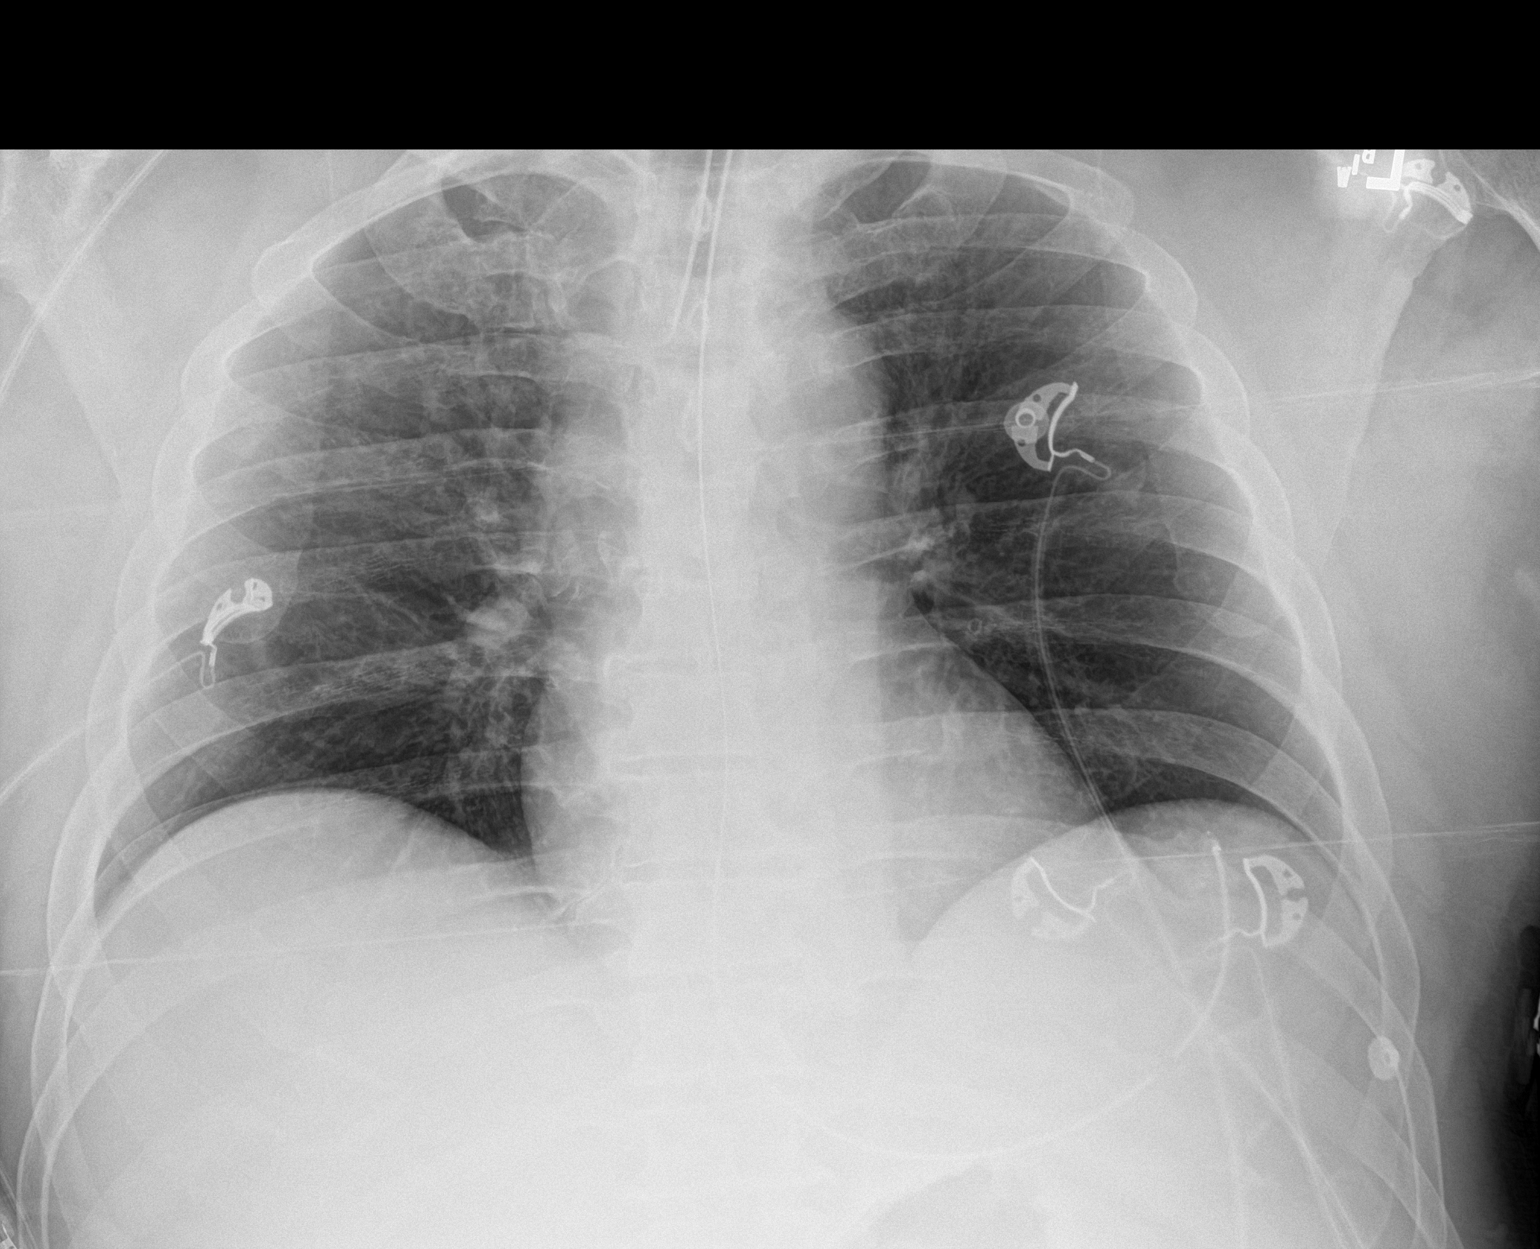

[1 of 1 positions shown; findings below may reference images not displayed]

FINDINGS: 7944 hours. Tip of the endotracheal tube is in the mid trachea. An
enteric tube projects below the diaphragm with tip in the left
subdiaphragmatic region. The heart size and mediastinal contours are
stable without evidence of mediastinal hematoma. Faint asymmetric
density projecting over the right upper lobe could reflect mild
contusion or atelectasis. The lungs are otherwise clear. There is no
pleural effusion or pneumothorax. No acute fractures are seen.
IMPRESSION: Satisfactory position of the endotracheal and enteric tubes.
Possible mild right upper lobe contusion or atelectasis.

## 2020-04-16 IMAGING — CT CT CHEST W/ CM
2 of 5 series · 12 of 36 positions shown, 15 images · IV contrast (omnipaque)
Comparison: CT cervical spine today reported separately. CTA chest
09/01/2008.

CLINICAL DATA: 59-year-old male status post unwitnessed fall down
approximately 10 stairs. GCS of 3, left arm flaccid. Laceration to
posterior head, blood from left ear.

EXAM:
CT CHEST, ABDOMEN, AND PELVIS WITH CONTRAST
TECHNIQUE: Multidetector CT imaging of the chest, abdomen and pelvis was
performed following the standard protocol during bolus
administration of intravenous contrast.
CONTRAST:  100mL OMNIPAQUE IOHEXOL 300 MG/ML  SOLN

[Series 3: cap with 5mm st · axial · 0.94mm/px · z∈[-801,-251]mm · 9 of 136 slices shown, 12 images]
[im 13/136  mediastinal]
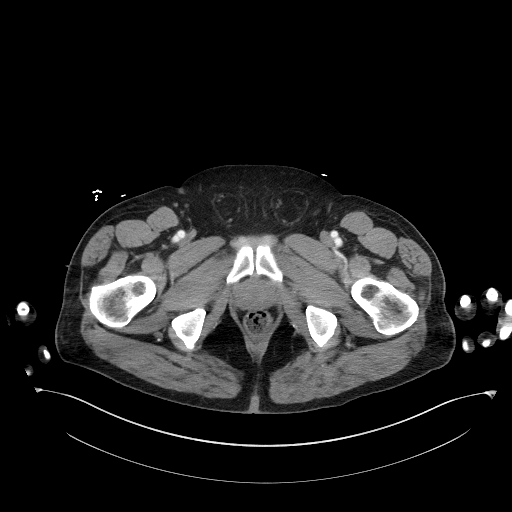
[im 13/136  lung]
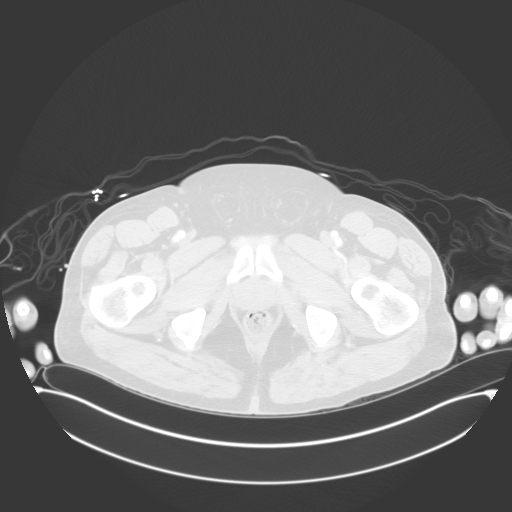
[im 25/136  lung]
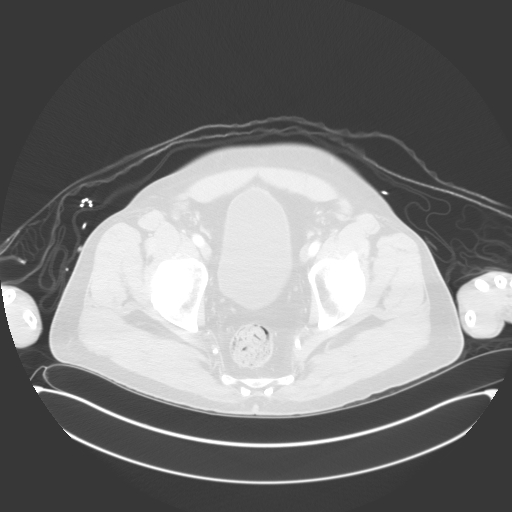
[im 37/136  lung]
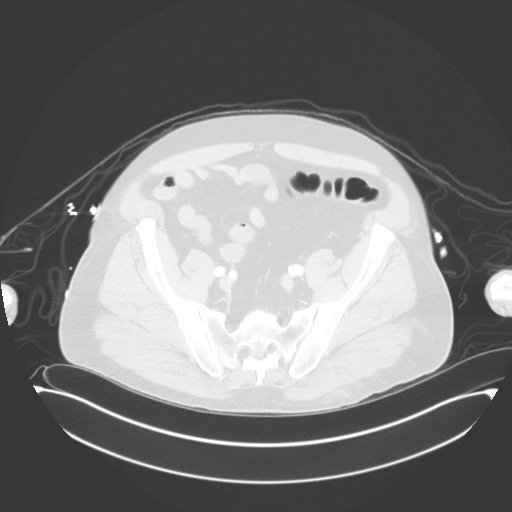
[im 50/136  lung]
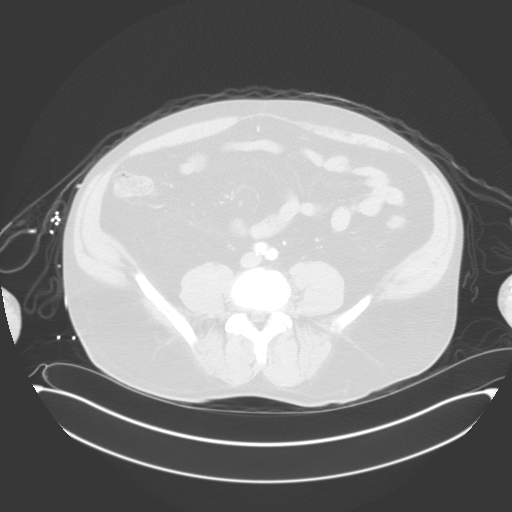
[im 74/136  mediastinal]
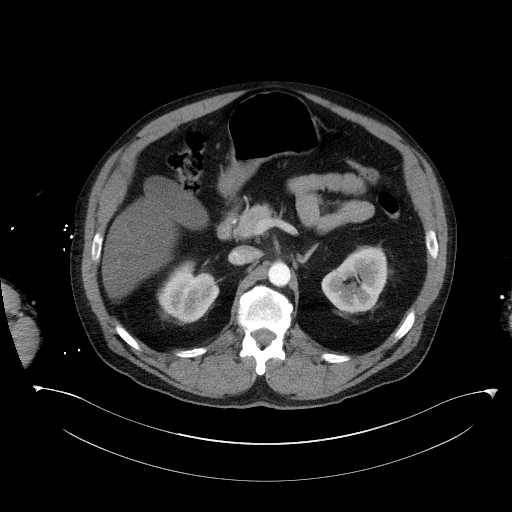
[im 74/136  lung]
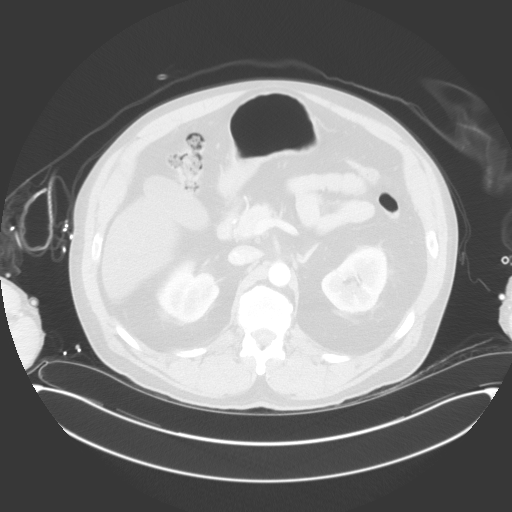
[im 86/136  lung]
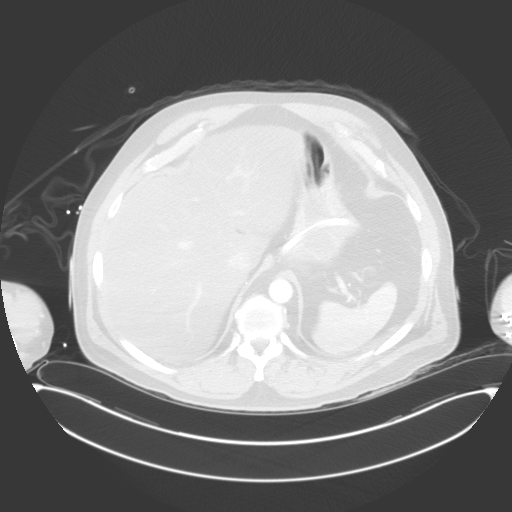
[im 99/136  lung]
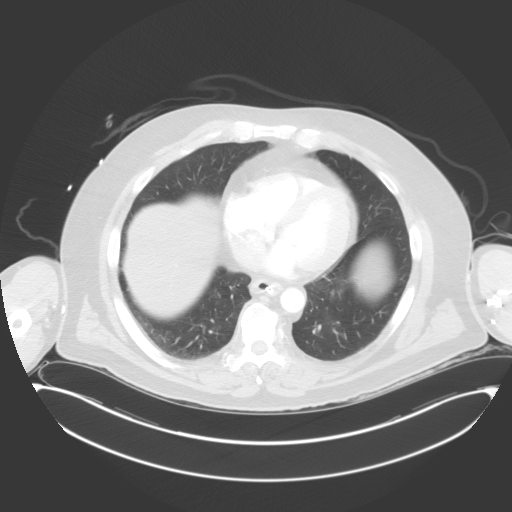
[im 111/136  lung]
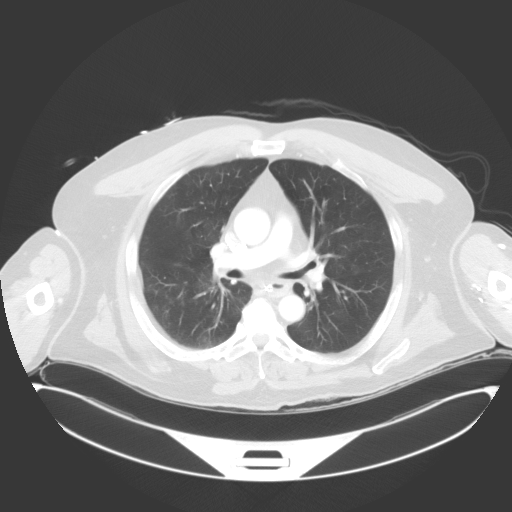
[im 123/136  mediastinal]
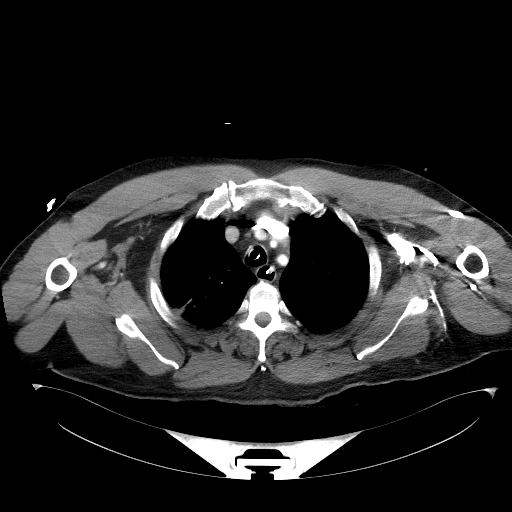
[im 123/136  lung]
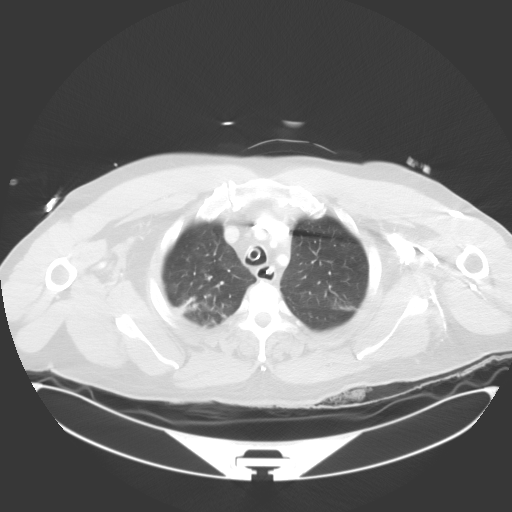

[Series 6: cap with 3mm st cor · coronal · 0.81mm/px · 3 of 184 slices shown]
[im 37/184  lung]
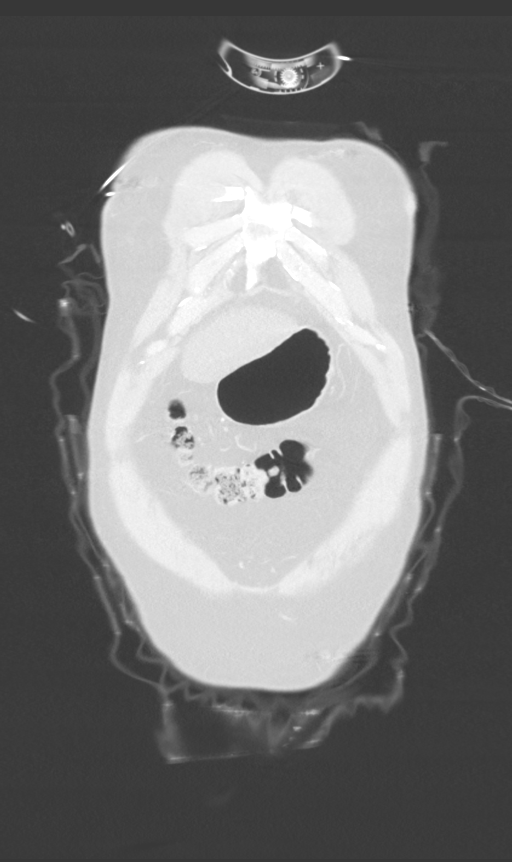
[im 74/184  lung]
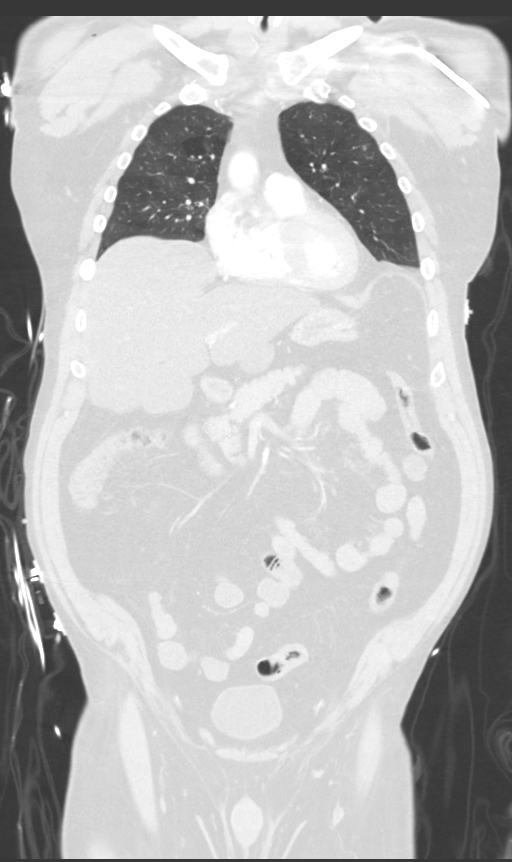
[im 110/184  lung]
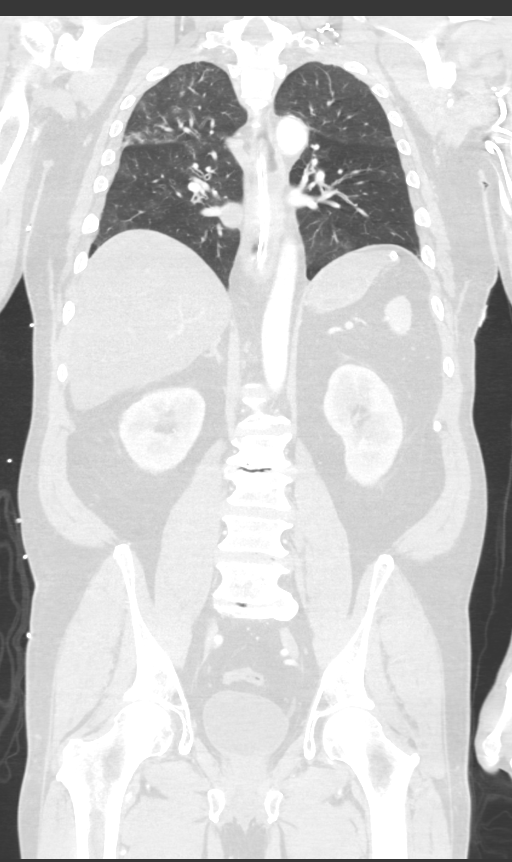

[12 of 36 positions shown; findings below may reference images not displayed]

FINDINGS: CT CHEST FINDINGS

Cardiovascular: Thoracic aorta appears intact with mild
atherosclerosis. No cardiomegaly or pericardial effusion. Other
central mediastinal vascular structures appear patent and intact.
Calcified carotid artery atherosclerosis on series 3, image 29.

Mediastinum/Nodes: Negative. No mediastinal hematoma or
lymphadenopathy. Enteric tube courses through the esophagus to the
stomach.

Lungs/Pleura: There is dependent and peribronchial patchy opacity in
the right upper lobe. By report, the patient may have aspirated
during intubation. Mild dependent opacity in the left upper lobe.
Scattered additional subtle peribronchial ground-glass opacity in
the right lung.

No pneumothorax. No pleural effusion there is a small volume of
pleural hematoma and/or adjacent pulmonary contusion in the left
costophrenic angle adjacent to a mildly displaced left posterior
10th rib fracture (series 5, image 122).

Musculoskeletal: Acute on chronic mildly displaced posterolateral
left 10th rib fracture. Nearby chronic left 9th rib fracture.

No other acute rib fracture identified. Sternum intact. Thoracic
vertebrae and visible shoulder osseous structures appear intact.

CT ABDOMEN PELVIS FINDINGS

Hepatobiliary: Moderate to severe hepatic steatosis. No superimposed
liver injury identified. Negative gallbladder.

Pancreas: Negative.

Spleen: Small congenital splenic clefts (coronal image 119). No
splenic injury or perisplenic fluid.

Adrenals/Urinary Tract: Normal adrenal glands. Bilateral renal
enhancement is symmetric. Contrast excretion is symmetric and
proximal ureters appear normal. No renal injury identified.
Unremarkable urinary bladder.

Stomach/Bowel: Mild sigmoid diverticulosis. Occasional diverticula
in the ascending colon. Otherwise negative large bowel with
diminutive or absent appendix. Negative terminal ileum. No dilated
small bowel. Small fat containing umbilical hernia. Enteric tube
terminates in the gastric fundus. Stomach and duodenum are within
normal limits. No free air, free fluid.

Vascular/Lymphatic: Intact abdominal aorta and other major arterial
structures with mild calcified atherosclerosis. Portal venous system
appears patent. No lymphadenopathy.

Reproductive: Negative aside from small fat containing inguinal
hernias.

Other: No pelvic free fluid.

Musculoskeletal: No acute lumbar vertebral fracture. Sacral ala and
SI joints appear intact. No pelvis or proximal femur fracture
identified.

There is some superficial soft tissue contusion along the left lower
flank/buttock on series 3, image 96. No other superficial soft
tissue injury identified.
IMPRESSION: 1. Acute on chronic mildly displaced fracture of the left posterior
10th rib. Small volume adjacent pleural hematoma and/or pulmonary
contusion in the left costophrenic angle. But no pneumothorax or
pleural effusion.

2. Other pulmonary opacity - mostly dependent in the right lung - is
compatible with aspiration.

3. Superficial soft tissue contusion/hematoma over the left lower
flank/buttock. No underlying fracture identified.

4. No other acute traumatic injury identified in the chest, abdomen,
or pelvis.

5. Moderate to severe hepatic steatosis.

This exam was preliminarily reviewed in person with Trauma Surgery
Dr. Yenji on 07/21/2019 at 4848 hours.
